# Patient Record
Sex: Female | Born: 1940
Health system: Southern US, Community
[De-identification: ages and names within clinical notes are randomized; demographics above are authoritative.]

## PROBLEM LIST (undated history)

## (undated) DIAGNOSIS — K5909 Other constipation: Secondary | ICD-10-CM

## (undated) DIAGNOSIS — Z9889 Other specified postprocedural states: Secondary | ICD-10-CM

## (undated) DIAGNOSIS — R112 Nausea with vomiting, unspecified: Secondary | ICD-10-CM

## (undated) DIAGNOSIS — I10 Essential (primary) hypertension: Secondary | ICD-10-CM

## (undated) DIAGNOSIS — M8580 Other specified disorders of bone density and structure, unspecified site: Secondary | ICD-10-CM

## (undated) DIAGNOSIS — E785 Hyperlipidemia, unspecified: Secondary | ICD-10-CM

## (undated) DIAGNOSIS — Z905 Acquired absence of kidney: Secondary | ICD-10-CM

## (undated) DIAGNOSIS — K259 Gastric ulcer, unspecified as acute or chronic, without hemorrhage or perforation: Secondary | ICD-10-CM

## (undated) DIAGNOSIS — M549 Dorsalgia, unspecified: Secondary | ICD-10-CM

## (undated) HISTORY — PX: UPPER GASTROINTESTINAL ENDOSCOPY: SHX188

## (undated) HISTORY — PX: NEPHRECTOMY LIVING DONOR: SUR877

## (undated) HISTORY — DX: Acquired absence of kidney: Z90.5

## (undated) HISTORY — PX: CLEFT PALATE REPAIR: SUR1165

## (undated) HISTORY — PX: TUBAL LIGATION: SHX77

## (undated) HISTORY — DX: Gastric ulcer, unspecified as acute or chronic, without hemorrhage or perforation: K25.9

## (undated) HISTORY — DX: Hyperlipidemia, unspecified: E78.5

## (undated) HISTORY — DX: Dorsalgia, unspecified: M54.9

## (undated) HISTORY — PX: COLONOSCOPY: SHX174

## (undated) HISTORY — DX: Essential (primary) hypertension: I10

## (undated) HISTORY — DX: Other constipation: K59.09

## (undated) HISTORY — DX: Other specified disorders of bone density and structure, unspecified site: M85.80

---

## 1998-03-02 ENCOUNTER — Emergency Department (HOSPITAL_COMMUNITY): Admission: EM | Admit: 1998-03-02 | Discharge: 1998-03-02 | Payer: Self-pay | Admitting: Emergency Medicine

## 2000-08-17 ENCOUNTER — Other Ambulatory Visit: Admission: RE | Admit: 2000-08-17 | Discharge: 2000-08-17 | Payer: Self-pay | Admitting: Family Medicine

## 2002-03-31 ENCOUNTER — Other Ambulatory Visit: Admission: RE | Admit: 2002-03-31 | Discharge: 2002-03-31 | Payer: Self-pay | Admitting: Family Medicine

## 2002-04-04 ENCOUNTER — Encounter: Payer: Self-pay | Admitting: Family Medicine

## 2002-04-04 ENCOUNTER — Encounter: Admission: RE | Admit: 2002-04-04 | Discharge: 2002-04-04 | Payer: Self-pay | Admitting: Family Medicine

## 2003-11-27 ENCOUNTER — Other Ambulatory Visit: Admission: RE | Admit: 2003-11-27 | Discharge: 2003-11-27 | Payer: Self-pay | Admitting: Family Medicine

## 2003-12-11 ENCOUNTER — Encounter: Admission: RE | Admit: 2003-12-11 | Discharge: 2003-12-11 | Payer: Self-pay | Admitting: Family Medicine

## 2004-05-23 ENCOUNTER — Emergency Department (HOSPITAL_COMMUNITY): Admission: EM | Admit: 2004-05-23 | Discharge: 2004-05-23 | Payer: Self-pay | Admitting: Family Medicine

## 2004-10-28 ENCOUNTER — Ambulatory Visit: Payer: Self-pay | Admitting: Family Medicine

## 2004-10-28 LAB — CONVERTED CEMR LAB: Hgb A1c MFr Bld: 5.2 %

## 2004-11-09 HISTORY — PX: FOOT NEUROMA SURGERY: SHX646

## 2005-01-06 ENCOUNTER — Other Ambulatory Visit: Admission: RE | Admit: 2005-01-06 | Discharge: 2005-01-06 | Payer: Self-pay | Admitting: Family Medicine

## 2005-01-06 ENCOUNTER — Ambulatory Visit: Payer: Self-pay | Admitting: Family Medicine

## 2005-01-06 LAB — CONVERTED CEMR LAB: Pap Smear: NORMAL

## 2005-01-07 LAB — HM DEXA SCAN

## 2005-01-20 ENCOUNTER — Encounter: Admission: RE | Admit: 2005-01-20 | Discharge: 2005-01-20 | Payer: Self-pay | Admitting: Family Medicine

## 2005-04-28 ENCOUNTER — Ambulatory Visit: Payer: Self-pay | Admitting: Family Medicine

## 2005-09-29 ENCOUNTER — Ambulatory Visit: Payer: Self-pay | Admitting: Family Medicine

## 2006-07-20 ENCOUNTER — Ambulatory Visit: Payer: Self-pay | Admitting: Family Medicine

## 2006-08-03 ENCOUNTER — Encounter: Admission: RE | Admit: 2006-08-03 | Discharge: 2006-08-03 | Payer: Self-pay | Admitting: Family Medicine

## 2006-11-02 ENCOUNTER — Ambulatory Visit: Payer: Self-pay | Admitting: Family Medicine

## 2006-11-02 LAB — CONVERTED CEMR LAB
ALT: 17 units/L (ref 0–40)
AST: 24 units/L (ref 0–37)
Basophils Absolute: 0 10*3/uL (ref 0.0–0.1)
Basophils Relative: 0.4 % (ref 0.0–1.0)
Cholesterol: 154 mg/dL (ref 0–200)
Eosinophils Absolute: 0.2 10*3/uL (ref 0.0–0.6)
Eosinophils Relative: 2.5 % (ref 0.0–5.0)
HCT: 39 % (ref 36.0–46.0)
HDL: 37.6 mg/dL — ABNORMAL LOW (ref >39.0)
Hemoglobin: 13.4 g/dL (ref 12.0–15.0)
LDL Cholesterol: 90 mg/dL (ref 0–99)
Lymphocytes Relative: 24 % (ref 12.0–46.0)
MCHC: 34.5 g/dL (ref 30.0–36.0)
MCV: 91.9 fL (ref 78.0–100.0)
Monocytes Absolute: 0.4 10*3/uL (ref 0.2–0.7)
Monocytes Relative: 5.6 % (ref 3.0–11.0)
Neutro Abs: 4.6 10*3/uL (ref 1.4–7.7)
Neutrophils Relative %: 67.5 % (ref 43.0–77.0)
Platelets: 220 10*3/uL (ref 150–400)
RBC: 4.24 M/uL (ref 3.87–5.11)
RDW: 12 % (ref 11.5–14.6)
TSH: 2.93 microintl units/mL (ref 0.35–5.50)
Total CHOL/HDL Ratio: 4.1
Triglycerides: 133 mg/dL (ref 0–149)
VLDL: 27 mg/dL (ref 0–40)
WBC: 6.9 10*3/uL (ref 4.5–10.5)

## 2006-12-07 ENCOUNTER — Ambulatory Visit: Payer: Self-pay | Admitting: Gastroenterology

## 2006-12-08 LAB — HM COLONOSCOPY

## 2006-12-17 ENCOUNTER — Ambulatory Visit: Payer: Self-pay | Admitting: Gastroenterology

## 2007-12-20 ENCOUNTER — Ambulatory Visit: Payer: Self-pay | Admitting: Family Medicine

## 2007-12-20 DIAGNOSIS — E785 Hyperlipidemia, unspecified: Secondary | ICD-10-CM | POA: Insufficient documentation

## 2007-12-20 DIAGNOSIS — E7800 Pure hypercholesterolemia, unspecified: Secondary | ICD-10-CM | POA: Insufficient documentation

## 2007-12-20 DIAGNOSIS — R7303 Prediabetes: Secondary | ICD-10-CM | POA: Insufficient documentation

## 2007-12-20 DIAGNOSIS — M899 Disorder of bone, unspecified: Secondary | ICD-10-CM | POA: Insufficient documentation

## 2007-12-20 DIAGNOSIS — M949 Disorder of cartilage, unspecified: Secondary | ICD-10-CM

## 2007-12-20 DIAGNOSIS — K5909 Other constipation: Secondary | ICD-10-CM | POA: Insufficient documentation

## 2007-12-23 LAB — CONVERTED CEMR LAB
ALT: 19 units/L (ref 0–35)
AST: 21 units/L (ref 0–37)
Albumin: 4 g/dL (ref 3.5–5.2)
Alkaline Phosphatase: 79 units/L (ref 39–117)
BUN: 13 mg/dL (ref 6–23)
Bilirubin, Direct: 0.1 mg/dL (ref 0.0–0.3)
CO2: 29 meq/L (ref 19–32)
Calcium: 9.4 mg/dL (ref 8.4–10.5)
Chloride: 109 meq/L (ref 96–112)
Cholesterol: 167 mg/dL (ref 0–200)
Creatinine, Ser: 1 mg/dL (ref 0.4–1.2)
GFR calc Af Amer: 72 mL/min
GFR calc non Af Amer: 59 mL/min
Glucose, Bld: 102 mg/dL — ABNORMAL HIGH (ref 70–99)
HDL: 35 mg/dL — ABNORMAL LOW (ref >39.0)
Hgb A1c MFr Bld: 5.7 % (ref 4.6–6.0)
LDL Cholesterol: 111 mg/dL — ABNORMAL HIGH (ref 0–99)
Phosphorus: 3.6 mg/dL (ref 2.3–4.6)
Potassium: 4.7 meq/L (ref 3.5–5.1)
Sodium: 143 meq/L (ref 135–145)
TSH: 2.46 microintl units/mL (ref 0.35–5.50)
Total Bilirubin: 0.6 mg/dL (ref 0.3–1.2)
Total CHOL/HDL Ratio: 4.8
Total Protein: 7 g/dL (ref 6.0–8.3)
Triglycerides: 107 mg/dL (ref 0–149)
VLDL: 21 mg/dL (ref 0–40)

## 2007-12-25 LAB — CONVERTED CEMR LAB: Vit D, 1,25-Dihydroxy: 40 (ref 30–89)

## 2008-03-13 ENCOUNTER — Ambulatory Visit: Payer: Self-pay | Admitting: Family Medicine

## 2008-03-13 ENCOUNTER — Other Ambulatory Visit: Admission: RE | Admit: 2008-03-13 | Discharge: 2008-03-13 | Payer: Self-pay | Admitting: Family Medicine

## 2008-03-13 ENCOUNTER — Encounter: Payer: Self-pay | Admitting: Family Medicine

## 2008-03-13 DIAGNOSIS — M545 Low back pain, unspecified: Secondary | ICD-10-CM | POA: Insufficient documentation

## 2008-03-13 DIAGNOSIS — M79605 Pain in left leg: Secondary | ICD-10-CM | POA: Insufficient documentation

## 2008-03-18 ENCOUNTER — Encounter (INDEPENDENT_AMBULATORY_CARE_PROVIDER_SITE_OTHER): Payer: Self-pay | Admitting: *Deleted

## 2008-03-18 LAB — CONVERTED CEMR LAB: Pap Smear: NORMAL

## 2008-03-19 ENCOUNTER — Encounter: Payer: Self-pay | Admitting: Family Medicine

## 2008-04-08 ENCOUNTER — Encounter: Admission: RE | Admit: 2008-04-08 | Discharge: 2008-04-08 | Payer: Self-pay | Admitting: Family Medicine

## 2008-04-08 ENCOUNTER — Encounter: Payer: Self-pay | Admitting: Family Medicine

## 2008-04-15 ENCOUNTER — Encounter (INDEPENDENT_AMBULATORY_CARE_PROVIDER_SITE_OTHER): Payer: Self-pay | Admitting: *Deleted

## 2008-05-11 ENCOUNTER — Ambulatory Visit: Payer: Self-pay | Admitting: Family Medicine

## 2008-05-18 LAB — CONVERTED CEMR LAB
OCCULT 1: NEGATIVE
OCCULT 2: NEGATIVE
OCCULT 3: NEGATIVE

## 2009-05-14 ENCOUNTER — Ambulatory Visit: Payer: Self-pay | Admitting: Family Medicine

## 2009-05-14 ENCOUNTER — Encounter: Admission: RE | Admit: 2009-05-14 | Discharge: 2009-05-14 | Payer: Self-pay | Admitting: Family Medicine

## 2009-05-17 LAB — CONVERTED CEMR LAB
ALT: 18 units/L (ref 0–35)
AST: 26 units/L (ref 0–37)
Albumin: 4.1 g/dL (ref 3.5–5.2)
BUN: 14 mg/dL (ref 6–23)
CO2: 32 meq/L (ref 19–32)
Calcium: 9.5 mg/dL (ref 8.4–10.5)
Chloride: 108 meq/L (ref 96–112)
Cholesterol: 155 mg/dL (ref 0–200)
Creatinine, Ser: 1.1 mg/dL (ref 0.4–1.2)
Glucose, Bld: 104 mg/dL — ABNORMAL HIGH (ref 70–99)
HDL: 38.1 mg/dL — ABNORMAL LOW (ref >39.00)
Hgb A1c MFr Bld: 5.8 % (ref 4.6–6.5)
LDL Cholesterol: 85 mg/dL (ref 0–99)
Phosphorus: 4 mg/dL (ref 2.3–4.6)
Potassium: 5 meq/L (ref 3.5–5.1)
Sodium: 145 meq/L (ref 135–145)
Total CHOL/HDL Ratio: 4
Triglycerides: 158 mg/dL — ABNORMAL HIGH (ref 0.0–149.0)
VLDL: 31.6 mg/dL (ref 0.0–40.0)

## 2009-05-19 ENCOUNTER — Encounter (INDEPENDENT_AMBULATORY_CARE_PROVIDER_SITE_OTHER): Payer: Self-pay | Admitting: *Deleted

## 2009-05-19 LAB — CONVERTED CEMR LAB: Vit D, 25-Hydroxy: 36 ng/mL (ref 30–89)

## 2009-10-06 ENCOUNTER — Telehealth: Payer: Self-pay | Admitting: Family Medicine

## 2010-04-05 ENCOUNTER — Ambulatory Visit: Payer: Self-pay | Admitting: Internal Medicine

## 2010-04-05 DIAGNOSIS — B029 Zoster without complications: Secondary | ICD-10-CM | POA: Insufficient documentation

## 2010-04-12 ENCOUNTER — Telehealth: Payer: Self-pay | Admitting: Internal Medicine

## 2010-04-25 ENCOUNTER — Telehealth: Payer: Self-pay | Admitting: Internal Medicine

## 2010-05-12 ENCOUNTER — Ambulatory Visit: Payer: Self-pay | Admitting: Family Medicine

## 2010-05-13 LAB — CONVERTED CEMR LAB
ALT: 14 units/L (ref 0–35)
AST: 18 units/L (ref 0–37)
Albumin: 4.1 g/dL (ref 3.5–5.2)
BUN: 22 mg/dL (ref 6–23)
CO2: 28 meq/L (ref 19–32)
Calcium: 9.9 mg/dL (ref 8.4–10.5)
Chloride: 103 meq/L (ref 96–112)
Cholesterol: 183 mg/dL (ref 0–200)
Creatinine, Ser: 0.9 mg/dL (ref 0.4–1.2)
GFR calc non Af Amer: 63.77 mL/min (ref >60)
Glucose, Bld: 96 mg/dL (ref 70–99)
HDL: 46.9 mg/dL (ref >39.00)
LDL Cholesterol: 109 mg/dL — ABNORMAL HIGH (ref 0–99)
Phosphorus: 3.6 mg/dL (ref 2.3–4.6)
Potassium: 5.3 meq/L — ABNORMAL HIGH (ref 3.5–5.1)
Sodium: 140 meq/L (ref 135–145)
Total CHOL/HDL Ratio: 4
Triglycerides: 137 mg/dL (ref 0.0–149.0)
VLDL: 27.4 mg/dL (ref 0.0–40.0)

## 2010-06-17 ENCOUNTER — Ambulatory Visit: Payer: Self-pay | Admitting: Family Medicine

## 2010-06-17 DIAGNOSIS — E875 Hyperkalemia: Secondary | ICD-10-CM | POA: Insufficient documentation

## 2010-06-20 LAB — CONVERTED CEMR LAB: Potassium: 4.9 meq/L (ref 3.5–5.1)

## 2010-07-01 ENCOUNTER — Encounter: Admission: RE | Admit: 2010-07-01 | Discharge: 2010-07-01 | Payer: Self-pay | Admitting: Family Medicine

## 2010-07-01 LAB — HM MAMMOGRAPHY: HM Mammogram: NORMAL

## 2010-07-05 ENCOUNTER — Encounter: Payer: Self-pay | Admitting: Family Medicine

## 2010-11-10 NOTE — Assessment & Plan Note (Signed)
Summary: cpx/dlo   Vital Signs:  Patient Profile:   70 Years Old Female Height:     63.50 inches Weight:      149 pounds Temp:     98.6 degrees F oral Pulse rate:   76 / minute Pulse rhythm:   regular BP sitting:   130 / 80 Cuff size:   regular  Vitals Entered By: Providence Crosby (March 13, 2008 10:29 AM)                 Chief Complaint:  CHECK UP// HEMOCCULT CARDS TO PATIENT.  History of Present Illness: has been feeling ok overall a lot of family events coming up  went to a health fair in [place]- last week end  did free chiropractic eval - did have inc weight on R  also looked at neck- ? abn in her neck was told her posture was bad   low back continues to hurt   labs in march LDL 111-- HDL 35  AIC 5.7   diet is pretty good - not perfect does exercise at [employer]-- at least 3 days per wk, also wts at home wants to start walking (walks a lot at work), also works on the farm  has not ridden her horse in a while   takes mvi daily vit D 1000 international units daily ca - not taking  is not on any OP med- decided to stop her actonel due to convenience  needs dexa and mam  colonosc last year tics-- still has tremendous constipation- take correctol regularly stays bloated all the time  amitiza did not help          Prior Medications Reviewed Using: Patient Recall  Current Allergies (reviewed today): ! CODEINE  Past Medical History:    Osteopenia    back pain     hyperlipidemia    hyperglycemia    chronic constipation  Past Surgical History:    Reviewed history from 12/20/2007 and no changes required:       Cleft palate       Tubal ligation       Nephrectomy-left, donar (02/1997)       Carotid dopplers- neg       Stress EKG- pos, ST changes, T wave inversion (11/1998)       Dexa- osteopenia LS (05/1999),  worse (04/2002),  stable (01/2005)       Colonoscopy- neg (1998,  04/2002)       Morton's neuroma surgery (11/2004)       Stress test- normal per  pt (2007)       Colonoscopy- neg (12/2006)   Family History:    mother- died of cancer (re occurance of ? colon cancer), DM, CAD. OP, dementia-- lived into her 40s     F CAD in 54s, and CHF  Social History:    Marital Status: Married    Children:     Occupation: - works at a church     smoked for 1 year after HS-- never since    no alcohol    Risk Factors:  Colonoscopy History:     Date of Last Colonoscopy:  12/08/2006    Results:  Diverticulosis     Physical Exam  General:     Well-developed,well-nourished,in no acute distress; alert,appropriate and cooperative throughout examination Head:     normocephalic, atraumatic, and no abnormalities observed.  cleft palate repair changes  Eyes:     vision grossly intact, pupils equal, pupils round, and  pupils reactive to light.  no conjunctival pallor, injection or icterus  Ears:     R ear normal and L ear normal.   Nose:     no nasal discharge.   Mouth:     pharynx pink and moist.   Neck:     supple with full rom and no masses or thyromegally, no JVD or carotid bruit  Breasts:     No mass, nodules, thickening, tenderness, bulging, retraction, inflamation, nipple discharge or skin changes noted.   Lungs:     Normal respiratory effort, chest expands symmetrically. Lungs are clear to auscultation, no crackles or wheezes. Heart:     Normal rate and regular rhythm. S1 and S2 normal without gallop, murmur, click, rub or other extra sounds. Abdomen:     Bowel sounds positive,abdomen soft and non-tender without masses, organomegaly or hernias noted.  no renal bruits  Rectal:     No external abnormalities noted. Normal sphincter tone. No rectal masses or tenderness.--heme neg stool  Genitalia:     Normal introitus for age, no external lesions, no vaginal discharge, mucosa pink and moist, no vaginal or cervical lesions, no vaginal atrophy, no friaility or hemorrhage, normal uterus size and position, no adnexal masses or  tenderness Msk:     No deformity or scoliosis noted of thoracic or lumbar spine.   Pulses:     R and L carotid,radial,femoral,dorsalis pedis and posterior tibial pulses are full and equal bilaterally Extremities:     No clubbing, cyanosis, edema, or deformity noted with normal full range of motion of all joints.   Neurologic:     sensation intact to light touch, gait normal, and DTRs symmetrical and normal.   Skin:     Intact without suspicious lesions or rashes few solar lentigos Cervical Nodes:     No lymphadenopathy noted Axillary Nodes:     No palpable lymphadenopathy Inguinal Nodes:     No significant adenopathy Psych:     normal affect, talkative and pleasant     Impression & Recommendations:  Problem # 1:  CONSTIPATION, CHRONIC (ICD-564.09) Assessment: Unchanged ref to GI to disc tx and ? if any studies she could participate in  on stool softener daily- and good diet/fluid intake  Orders: Gastroenterology Referral (GI)   Problem # 2:  BACK PAIN (ICD-724.5) Assessment: Deteriorated ongoing- with ? re: recent chiropractic screen ref to ortho Orders: Orthopedic Referral (Ortho)   Problem # 3:  OTHER SCREENING MAMMOGRAM (ICD-V76.12) Assessment: Comment Only enc self exams sched mam Orders: Radiology Referral (Radiology)   Problem # 4:  HYPERLIPIDEMIA (ICD-272.4) Assessment: Unchanged fair control with diet and statin keep working on inc HDL with exercise and omega 3 Her updated medication list for this problem includes:    Lipitor 10 Mg Tabs (Atorvastatin calcium) .Marland Kitchen... 1 by mouth once daily   Problem # 5:  HYPERGLYCEMIA (ICD-790.29) Assessment: Unchanged AIC is under 6-- urged to keep up good work with diet   Problem # 6:  ROUTINE GYNECOLOGICAL EXAMINATION (ICD-V72.31) Assessment: Comment Only exam and pap done today- pend results   Complete Medication List: 1)  Lipitor 10 Mg Tabs (Atorvastatin calcium) .Marland Kitchen.. 1 by mouth once daily 2)  D 1000 Plus  Tabs (Fa-cyanocobalamin-b6-d-ca) .Marland Kitchen.. 1 daily by mouth 3)  Flax Seed Oil 1000 Mg Caps (Flaxseed (linseed)) .Marland Kitchen.. 1 daily by mouth 4)  Centrum Silver Tabs (Multiple vitamins-minerals) .Marland Kitchen.. 1 daily by mouth 5)  Correctal Tablet  .Marland Kitchen.. 1 daily by mouth  Other Orders:  Radiology Referral (Radiology)   Patient Instructions: 1)  the current recommendation for calcium intake is 1200-1500 mg daily with [phone] IU of vitamin D  2)  keep up good exercise  3)  we will set up mammogram and dexa at check out  4)  will refer you to orthopedics for back pain 5)  will refer you to GI for constipation   ]  Preventive Care Screening  Colonoscopy:    Date:  12/08/2006    Results:  Diverticulosis      not interested in flu vaccine, pnuemovax , and shingle vaccine

## 2010-11-10 NOTE — Letter (Signed)
Summary: Results Follow up Letter  [place] [facility]  [address] [place], [place] [address]   Phone: [phone]  Fax: [phone]    07/05/2010 MRN: [id number]    [name] [address] [place], [place]  [address]    Dear Ms. [name],  The following are the results of your recent test(s):  Test         Result    Pap Smear:        Normal _____  Not Normal _____ Comments: ______________________________________________________ Cholesterol: LDL(Bad cholesterol):         Your goal is less than:         HDL (Good cholesterol):       Your goal is more than: Comments:  ______________________________________________________ Mammogram:        Normal __X___  Not Normal _____ Comments:Repeat in one year.   ___________________________________________________________________ Hemoccult:        Normal _____  Not normal _______ Comments:    _____________________________________________________________________ Other Tests:    We routinely do not discuss normal results over the telephone.  If you desire a copy of the results, or you have any questions about this information we can discuss them at your next office visit.   Sincerely,    Idamae Schuller Tower,MD  MT/ri

## 2010-11-10 NOTE — Progress Notes (Signed)
Summary: regarding lipitor  Phone Note From Pharmacy   Caller: Gweneth Dimitri Summary of Call: Pharmacy faxed request to change pt from lipitor to simvastatin.  I spoke with the patient and she said she is fine with lipitor for now. Just an FYI. Initial call taken by: Lowella Petties CMA,  October 06, 2009 2:39 PM

## 2010-11-10 NOTE — Assessment & Plan Note (Signed)
Summary: PER LOU OK TO SCHED-NERVE PAIN DOWN R ARM--BACK RASH AND R AR...   Vital Signs:  Patient profile:   70 year old female Height:      63.50 inches (161.29 cm) Weight:      143.0 pounds (65 kg) BMI:     25.02 O2 Sat:      98 % on Room air Temp:     98.9 degrees F (37.17 degrees C) oral Pulse rate:   74 / minute BP sitting:   142 / 90  (left arm) Cuff size:   regular  Vitals Entered By: Orlan Leavens (April 05, 2010 11:31 AM)  O2 Flow:  Room air CC: Rash & Nerve pain Is Patient Diabetic? No Pain Assessment Patient in pain? yes     Location: (R) arn & shoulder Type: sharp/stinging Comments Pt states she been have pain on (R) shoulder x's 1 week. pain in radiating down (R) arm. As of yesterday she has develop a rash on her back arm. ? shingles   Primary Care Provider:  Judith Part MD  CC:  Rash & Nerve pain.  History of Present Illness:  Rash      This is a 70 year old woman who presents with Rash.  The symptoms began 3 days ago.  The severity is described as severe.  began with burning, intermittent pain along right posterior shoulder radiating down right arm 1 week ago. pain not exac bu movement or acticity, not improved with muscle relaxant or narcotic from ortho prev rx for shoulder problems.  The patient reports blisters and redness, but denies hives, pustules, and scaling.  The rash is located on the right arm.  The rash is worse with heat and better with cold.  Associated symptoms include burning.  The patient denies the following symptoms: fever, headache, difficulty breathing, abdominal pain, nausea, vomiting, diarrhea, dizziness, sore throat, eye symptoms, and arthralgias.  The patient denies history of recent tick bite, other insect bite, recent infection, recent antibiotic use, new medication, new clothing, new topical exposure, recent travel, pet/animal contact, and autoimmune disease.    Clinical Review Panels:  CBC   WBC:  6.9 (11/02/2006)   RBC:  4.24  (11/02/2006)   Hgb:  13.4 (11/02/2006)   Hct:  39.0 (11/02/2006)   Platelets:  220 (11/02/2006)   MCV  91.9 (11/02/2006)   MCHC  34.5 (11/02/2006)   RDW  12.0 (11/02/2006)   PMN:  67.5 (11/02/2006)   Lymphs:  24.0 (11/02/2006)   Monos:  5.6 (11/02/2006)   Eosinophils:  2.5 (11/02/2006)   Basophil:  0.4 (11/02/2006)  Complete Metabolic Panel   Glucose:  104 (05/14/2009)   Sodium:  145 (05/14/2009)   Potassium:  5.0 (05/14/2009)   Chloride:  108 (05/14/2009)   CO2:  32 (05/14/2009)   BUN:  14 (05/14/2009)   Creatinine:  1.1 (05/14/2009)   Albumin:  4.1 (05/14/2009)   Total Protein:  7.0 (12/20/2007)   Calcium:  9.5 (05/14/2009)   Total Bili:  0.6 (12/20/2007)   Alk Phos:  79 (12/20/2007)   SGPT (ALT):  18 (05/14/2009)   SGOT (AST):  26 (05/14/2009)   Current Medications (verified): 1)  Lipitor 10 Mg  Tabs (Atorvastatin Calcium) .Marland Kitchen.. 1 By Mouth Once Daily 2)  D 1000 Plus   Tabs (Fa-Cyanocobalamin-B6-D-Ca) .Marland Kitchen.. 1 Daily By Mouth 3)  Centrum Silver   Tabs (Multiple Vitamins-Minerals) .Marland Kitchen.. 1 Daily By Mouth 4)  Correctal Tablet .Marland KitchenMarland Kitchen. 1 Daily By Mouth As Needed 5)  Vitamin D 2000 Unit Tabs (Cholecalciferol) .... .take 1 Tablet By Mouth Once A Day  Allergies (verified): 1)  ! Codeine  Past History:  Past Medical History: Osteopenia back pain  hyperlipidemia hyperglycemia chronic constipation  MD roster: ortho - gioffre  Review of Systems       The patient complains of suspicious skin lesions.  The patient denies chest pain, syncope, headaches, and muscle weakness.         also denies neck pain  Physical Exam  General:  alert, well-developed, well-nourished, and cooperative to examination.    Eyes:  vision grossly intact; pupils equal, round and reactive to light.  conjunctiva and lids normal.    Lungs:  normal respiratory effort, no intercostal retractions or use of accessory muscles; normal breath sounds bilaterally - no crackles and no wheezes.    Heart:  normal  rate, regular rhythm, no murmur, and no rub. BLE without edema. normal DP pulses and normal cap refill in all 4 extremities    Msk:  right shoulder: Full range of motion. full strength with testing rotator cuff. Negative impingement signs. Nontender to palpation. Neurovascularly intact  Skin:  classic clusters of tiny vesicles on rosy base in multiple areas along right C6 dermatome c/w shingles   Impression & Recommendations:  Problem # 1:  SHINGLES (ICD-053.9) severe pain symptoms 7 days -  now with classic rash in last 72 h - start antiviral valtrex and lyrica - educated on dx and tx and poss post herpetic neuralgia pain as well as need to avoid contact with immune compromised population tordol given for acute pain symptoms  Orders: Ketorolac-Toradol 15mg  (J4782) Admin of Therapeutic Inj  intramuscular or subcutaneous (95621)  Complete Medication List: 1)  Lipitor 10 Mg Tabs (Atorvastatin calcium) .Marland Kitchen.. 1 by mouth once daily 2)  D 1000 Plus Tabs (Fa-cyanocobalamin-b6-d-ca) .Marland Kitchen.. 1 daily by mouth 3)  Centrum Silver Tabs (Multiple vitamins-minerals) .Marland Kitchen.. 1 daily by mouth 4)  Correctal Tablet  .Marland Kitchen.. 1 daily by mouth as needed 5)  Vitamin D 2000 Unit Tabs (Cholecalciferol) .... .take 1 tablet by mouth once a day 6)  Valtrex 1 Gm Tabs (Valacyclovir hcl) .Marland Kitchen.. 1 by mouth three times a day x 7 days 7)  Lyrica 75 Mg Caps (Pregabalin) .Marland Kitchen.. 1 by mouth two times a day  Patient Instructions: 1)  it was good to see you today. 2)  for your shingles, Valtrex for the virus and Lyrica for the nerves pain - your prescriptions have been  given to you to submit to your pharmacy. Please take as directed. Contact our office if you believe you're having problems with the medication(s).  3)  tordol shot for pain given today 4)  avoid contact with people who are immune compromised and keep rash covered if you are in public 5)  Please schedule a follow-up appointment as needed. Prescriptions: LYRICA 75 MG CAPS  (PREGABALIN) 1 by mouth two times a day  #60 x 1   Entered and Authorized by:   Newt Lukes MD   Signed by:   Newt Lukes MD on 04/05/2010   Method used:   Print then Give to Patient   RxID:   [id number] VALTREX 1 GM TABS (VALACYCLOVIR HCL) 1 by mouth three times a day x 7 days  #21 x 0   Entered and Authorized by:   Newt Lukes MD   Signed by:   Newt Lukes MD on 04/05/2010   Method used:  Print then Give to Patient   RxID:   [id number]    Medication Administration  Injection # 1:    Medication: Ketorolac-Toradol 15mg     Diagnosis: SHINGLES (ICD-053.9)    Route: IM    Site: RUOQ gluteus    Exp Date: 11/09/2010    Lot #: [id number]    Mfr: novaplus    Patient tolerated injection without complications    Given by: Orlan Leavens (April 05, 2010 12:01 PM)  Orders Added: 1)  Ketorolac-Toradol 15mg  [J1885] 2)  Admin of Therapeutic Inj  intramuscular or subcutaneous [96372] 3)  Est. Patient Level IV [65784]

## 2010-11-10 NOTE — Assessment & Plan Note (Signed)
Summary: REFILL MEDICATION/CLE   Vital Signs:  Patient profile:   70 year old female Height:      63.50 inches Weight:      144 pounds BMI:     25.20 Temp:     98.2 degrees F oral Pulse rate:   88 / minute Pulse rhythm:   regular BP sitting:   130 / 80  (left arm) Cuff size:   regular  Vitals Entered By: Liane Comber CMA (May 14, 2009 8:27 AM)  History of Present Illness: here for f/u mult med issues feeling good   bp is good 130/80  lipids have been well controlled on lipitor- due a check  no probs with med-- but if it becomes more expensive will need to change to generic  osteopenia stable - dexa in 09 ca and vit D-- for her bones , is on 2000 international units daily   colonosc utd 08  wt is down 4 lb  is trying to loose wt- eating healthy (elim sweets)  some exercise too - goes to [employer] , and some walking   mam 7/09--has it scheduled at 11:30 today   have been watching for hyperglycemia -- check AIC today diet is great with some wt losss     Allergies: 1)  ! Codeine  Past History:  Past Medical History: Last updated: March 21, 2008 Osteopenia back pain  hyperlipidemia hyperglycemia chronic constipation  Past Surgical History: Last updated: 12/20/2007 Cleft palate Tubal ligation Nephrectomy-left, donar (02/1997) Carotid dopplers- neg Stress EKG- pos, ST changes, T wave inversion (11/1998) Dexa- osteopenia LS (05/1999),  worse (04/2002),  stable (01/2005) Colonoscopy- neg (1998,  04/2002) Morton's neuroma surgery (11/2004) Stress test- normal per pt (2007) Colonoscopy- neg (12/2006)  Family History: Last updated: 03-21-2008 mother- died of cancer (re occurance of ? colon cancer), DM, CAD. OP, dementia-- lived into her 19s  F CAD in 37s, and CHF  Social History: Last updated: 03-21-2008 Marital Status: Married Children:  Occupation: - works at [employer]  smoked for 1 year after HS-- never since no alcohol   Risk Factors: Smoking  Status: never (12/20/2007)  Review of Systems General:  Denies fatigue, fever, loss of appetite, and malaise. Eyes:  Denies blurring and eye pain. CV:  Denies chest pain or discomfort, palpitations, and shortness of breath with exertion. Resp:  Denies cough and wheezing. GI:  Denies abdominal pain, change in bowel habits, and indigestion. MS:  Denies joint pain and muscle aches. Derm:  Denies itching, lesion(s), and rash. Neuro:  Denies numbness and tingling. Psych:  mood is good . Endo:  Denies excessive thirst and excessive urination. Heme:  Denies abnormal bruising.  Physical Exam  General:  Well-developed,well-nourished,in no acute distress; alert,appropriate and cooperative throughout examination Head:  normocephalic, atraumatic, and no abnormalities observed.   Eyes:  vision grossly intact, pupils equal, pupils round, and pupils reactive to light.  no conjunctival pallor, injection or icterus  Mouth:  pharynx pink and moist.   Neck:  supple with full rom and no masses or thyromegally, no JVD or carotid bruit  Chest Wall:  No deformities, masses, or tenderness noted. Lungs:  Normal respiratory effort, chest expands symmetrically. Lungs are clear to auscultation, no crackles or wheezes. Heart:  Normal rate and regular rhythm. S1 and S2 normal without gallop, murmur, click, rub or other extra sounds. Abdomen:  soft and non-tender.   Msk:  No deformity or scoliosis noted of thoracic or lumbar spine.  no acute joint changes  Pulses:  R and L carotid,radial,femoral,dorsalis pedis and posterior tibial pulses are full and equal bilaterally Extremities:  No clubbing, cyanosis, edema, or deformity noted with normal full range of motion of all joints.   Neurologic:  sensation intact to light touch, gait normal, and DTRs symmetrical and normal.   Skin:  Intact without suspicious lesions or rashes Cervical Nodes:  No lymphadenopathy noted Psych:  normal affect, talkative and pleasant     Impression & Recommendations:  Problem # 1:  HYPERLIPIDEMIA (ICD-272.4) Assessment Unchanged  with good diet and prior good control with lipitor lab today and update rev low sat fat diet  Her updated medication list for this problem includes:    Lipitor 10 Mg Tabs (Atorvastatin calcium) .Marland Kitchen... 1 by mouth once daily  Labs Reviewed: SGOT: 21 (12/20/2007)   SGPT: 19 (12/20/2007)   HDL:35.0 (12/20/2007), 37.6 (11/02/2006)  LDL:111 (12/20/2007), 90 (16/07/9603)  Chol:167 (12/20/2007), 154 (11/02/2006)  Trig:107 (12/20/2007), 133 (11/02/2006)  Problem # 2:  HYPERGLYCEMIA (ICD-790.29) Assessment: Unchanged  this has been controlled with diet and exercise  urged to keep it up  AICtoday and renal prof  Orders: Venipuncture (54098) TLB-A1C / Hgb A1C (Glycohemoglobin) (83036-A1C)  Labs Reviewed: Creat: 1.0 (12/20/2007)     Problem # 3:  OTHER SCREENING MAMMOGRAM (ICD-V76.12) Assessment: Comment Only for mam today  Problem # 4:  OSTEOPENIA (ICD-733.90) Assessment: Unchanged with stable dexa in 09 adv to continue ca and D and exercise vit D level today Orders: Venipuncture (11914) T-Vitamin D (25-Hydroxy) (78295-62130)  Complete Medication List: 1)  Lipitor 10 Mg Tabs (Atorvastatin calcium) .Marland Kitchen.. 1 by mouth once daily 2)  D 1000 Plus Tabs (Fa-cyanocobalamin-b6-d-ca) .Marland Kitchen.. 1 daily by mouth 3)  Centrum Silver Tabs (Multiple vitamins-minerals) .Marland Kitchen.. 1 daily by mouth 4)  Correctal Tablet  .Marland Kitchen.. 1 daily by mouth 5)  Vitamin D 200 Mg  .... Take 1 tablet by mouth once a day  Other Orders: TLB-Lipid Panel (80061-LIPID) TLB-Renal Function Panel (80069-RENAL) TLB-ALT (SGPT) (84460-ALT) TLB-AST (SGOT) (84450-SGOT)  Patient Instructions: 1)  keep up the good work with diet and exercise 2)  no change in medicine  3)  labs today Prescriptions: LIPITOR 10 MG  TABS (ATORVASTATIN CALCIUM) 1 by mouth once daily  #90 x 3   Entered and Authorized by:   Judith Part MD   Signed by:    Judith Part MD on 05/14/2009   Method used:   Print then Give to Patient   RxID:   [id number]   Prior Medications (reviewed today): D 1000 PLUS   TABS (FA-CYANOCOBALAMIN-B6-D-CA) 1 DAILY BY MOUTH CENTRUM SILVER   TABS (MULTIPLE VITAMINS-MINERALS) 1 DAILY BY MOUTH CORRECTAL TABLET () 1 DAILY BY MOUTH VITAMIN D 200 MG () Take 1 tablet by mouth once a day Current Allergies (reviewed today): ! CODEINE Current Medications (including changes made in today's visit):  LIPITOR 10 MG  TABS (ATORVASTATIN CALCIUM) 1 by mouth once daily D 1000 PLUS   TABS (FA-CYANOCOBALAMIN-B6-D-CA) 1 DAILY BY MOUTH CENTRUM SILVER   TABS (MULTIPLE VITAMINS-MINERALS) 1 DAILY BY MOUTH * CORRECTAL TABLET 1 DAILY BY MOUTH * VITAMIN D 200 MG Take 1 tablet by mouth once a day

## 2010-11-10 NOTE — Consult Note (Signed)
Summary: [facility] / DR. Tinnie Gens BEANE  [facility] / DR. Jene Every   Imported By: Carin Primrose 03/25/2008 07:55:06  _____________________________________________________________________  External Attachment:    Type:   Image     Comment:   External Document

## 2010-11-10 NOTE — Assessment & Plan Note (Signed)
Summary: FOLLOW UP APPT/RI   Vital Signs:  Patient profile:   70 year old female Height:      63.50 inches Weight:      143.25 pounds BMI:     25.07 Temp:     99 degrees F oral Pulse rate:   72 / minute Pulse rhythm:   regular BP sitting:   146 / 90  (left arm) Cuff size:   regular  Vitals Entered By: Lewanda Rife LPN 06/30/2010 12:07 PM) CC: follow up to discuss K   History of Present Illness: here for f/u of mildly high K   is feeling fine     wt is stable   bp 146/90- this is up a bit  lpids good with trig 137, HDl 46 and LDL 109- this is up a bit  eating more mayo    K was 5.3- ? reason  adv to stop K vits and also high K foods-- has been eating a tremendous amt of tomato  holding the centrum silver  has eaten a few bananas  renal fxn is nl  diet overall has not changed  [facility]    Allergies: 1)  ! Codeine  Past History:  Past Medical History: Last updated: 04/05/2010 Osteopenia back pain  hyperlipidemia hyperglycemia chronic constipation  MD roster: ortho - gioffre  Past Surgical History: Last updated: 12/20/2007 Cleft palate Tubal ligation Nephrectomy-left, donar (02/1997) Carotid dopplers- neg Stress EKG- pos, ST changes, T wave inversion (11/1998) Dexa- osteopenia LS (05/1999),  worse (04/2002),  stable (01/2005) Colonoscopy- neg (1998,  04/2002) Morton's neuroma surgery (11/2004) Stress test- normal per pt (2007) Colonoscopy- neg (12/2006)  Family History: Last updated: 30-Jun-2010 mother- died of cancer (re occurance of ? colon cancer), DM, CAD., HTN  OP, dementia-- lived into her 9s  F CAD in 76s, and CHF, HTN   Social History: Last updated: 03/13/2008 Marital Status: Married Children:  Occupation: - works at a church  smoked for 1 year after HS-- never since no alcohol   Risk Factors: Smoking Status: never (12/20/2007)  Family History: mother- died of cancer (re occurance of ? colon cancer),  DM, CAD., HTN  OP, dementia-- lived into her 61s  F CAD in 60s, and CHF, HTN   Review of Systems General:  Denies chills, fatigue, fever, loss of appetite, and malaise. Eyes:  Denies blurring and eye irritation. CV:  Denies chest pain or discomfort, lightheadness, and palpitations. Resp:  Denies cough, pleuritic, shortness of breath, and wheezing. GI:  Denies abdominal pain, change in bowel habits, indigestion, and nausea. GU:  Denies dysuria and urinary frequency. MS:  Complains of cramps; denies stiffness; occ leg cramps . Derm:  Denies itching, lesion(s), poor wound healing, and rash. Neuro:  Denies numbness and tingling. Endo:  Denies cold intolerance, excessive thirst, excessive urination, and heat intolerance. Heme:  Denies abnormal bruising and bleeding.  Physical Exam  General:  Well-developed,well-nourished,in no acute distress; alert,appropriate and cooperative throughout examination Head:  normocephalic, atraumatic, and no abnormalities observed.   Eyes:  vision grossly intact, pupils equal, pupils round, and pupils reactive to light.   Mouth:  pharynx pink and moist.   Neck:  supple with full rom and no masses or thyromegally, no JVD or carotid bruit  Chest Wall:  No deformities, masses, or tenderness noted. Lungs:  normal respiratory effort, no intercostal retractions or use of accessory muscles; normal breath sounds bilaterally - no crackles and no wheezes.  Heart:  normal rate, regular rhythm, no murmur, and no rub. BLE without edema. normal DP pulses and normal cap refill in all 4 extremities    Abdomen:  Bowel sounds positive,abdomen soft and non-tender without masses, organomegaly or hernias noted. no renal bruits  Msk:  No deformity or scoliosis noted of thoracic or lumbar spine.  no acute joint changes  Pulses:  R and L carotid,radial,femoral,dorsalis pedis and posterior tibial pulses are full and equal bilaterally Extremities:  No clubbing, cyanosis, edema, or  deformity noted with normal full range of motion of all joints.   Neurologic:  sensation intact to light touch, gait normal, and DTRs symmetrical and normal.   Skin:  Intact without suspicious lesions or rashes Cervical Nodes:  No lymphadenopathy noted Inguinal Nodes:  No significant adenopathy Psych:  normal affect, talkative and pleasant    Impression & Recommendations:  Problem # 1:  HYPERKALEMIA (ICD-276.7) Assessment New possibly from tomato intake  re check today adv to avoid supplements with K Orders: TLB-Potassium (K+) (84132-K) Fingerstick ([address])  Problem # 2:  HYPERLIPIDEMIA (ICD-272.4) Assessment: Unchanged well controlled on lipitor and diet rev labs with pt  rev low sat fat diet Her updated medication list for this problem includes:    Lipitor 10 Mg Tabs (Atorvastatin calcium) .Marland Kitchen... 1 by mouth once daily  Labs Reviewed: SGOT: 18 (05/12/2010)   SGPT: 14 (05/12/2010)   HDL:46.90 (05/12/2010), 38.10 (05/14/2009)  LDL:109 (05/12/2010), 85 (60/45/4098)  Chol:183 (05/12/2010), 155 (05/14/2009)  Trig:137.0 (05/12/2010), 158.0 (05/14/2009)  Complete Medication List: 1)  Lipitor 10 Mg Tabs (Atorvastatin calcium) .Marland Kitchen.. 1 by mouth once daily 2)  D 1000 Plus Tabs (Fa-cyanocobalamin-b6-d-ca) .Marland Kitchen.. 1 daily by mouth 3)  Centrum Silver Tabs (Multiple vitamins-minerals) .Marland Kitchen.. 1 daily by mouth 4)  Correctal Tablet  .Marland Kitchen.. 1 daily by mouth as needed 5)  Vitamin D 2000 Unit Tabs (Cholecalciferol) .... .take 1 tablet by mouth once a day  Patient Instructions: 1)  labs today for potassium  2)  try to stick to low cholesterol diet -- stick to low fat mayo (you can raise your HDL (good cholesterol) by increasing exercise and eating omega 3 fatty acid supplement like fish oil or flax seed oil over the counter 3)  you can lower LDL (bad cholesterol) by limiting saturated fats in diet like red meat, fried foods, egg yolks, fatty breakfast meats, high fat dairy products and shellfish ) 4)   follow up in 6 months - to evaluate blood pressure again  5)  don't forget to get your mammogram   Current Allergies (reviewed today): ! CODEINE

## 2010-11-10 NOTE — Letter (Signed)
Summary: Generic Letter  [facility]  [address] [place], [place] [address]   Phone: [phone]  Fax: [phone]    05/19/2009    [name] [address] [place], [place]  [address]     Dear [name],     Your mammogram was normal, please repeat screening in one year.     Sincerely,   Liane Comber CMA

## 2010-11-10 NOTE — Miscellaneous (Signed)
Summary: pap results   Clinical Lists Changes  Observations: Added new observation of PAP SMEAR: normal (03/18/2008 8:13)       Preventive Care Screening  Pap Smear:    Date:  03/18/2008    Results:  normal

## 2010-11-10 NOTE — Miscellaneous (Signed)
Summary: mammo results   Clinical Lists Changes  Observations: Added new observation of MAMMO DUE: 04/2009 (04/15/2008 8:26) Added new observation of MAMMOGRAM: normal (04/15/2008 8:26)       Preventive Care Screening  Mammogram:    Date:  04/15/2008    Next Due:  04/2009    Results:  normal

## 2010-11-10 NOTE — Assessment & Plan Note (Signed)
Summary: MED REFILL/ LABS???/[facility]  Medications Added LIPITOR 10 MG  TABS (ATORVASTATIN CALCIUM) 1 by mouth once daily        Vital Signs:  Patient Profile:   70 Years Old Female Weight:      147 pounds Temp:     98.7 degrees F oral Pulse rate:   84 / minute Pulse rhythm:   regular BP sitting:   130 / 80  (left arm) Cuff size:   regular  Vitals Entered By: Lowella Petties (December 20, 2007 8:39 AM)                 Chief Complaint:  Refill lipitor.  History of Present Illness: has been doing well   some cold with cough this season- taking some claritin  healthy diet and exercise- has joined [employer]- goes 3 days per week  also some treadmill is here to check labs for cholesterol  needs to schedule PE wants to get labs today for that   still chronic constip - amitiza does not work, still has to rely on laxatives to have bm      Current Allergies: ! CODEINE   Family History:    mother- died of cancer   Social History:    [employer] program and treadmill for exercise     Review of Systems  General      Denies fatigue and loss of appetite.  Eyes      Denies blurring.  CV      Denies chest pain or discomfort and palpitations.  Resp      Denies shortness of breath.  GI      Denies bloody stools and dark tarry stools.  Derm      Denies rash.  Psych      mood has been ok  Endo      Denies excessive thirst and excessive urination.   Physical Exam  General:     Well-developed,well-nourished,in no acute distress; alert,appropriate and cooperative throughout examination Head:     normocephalic, atraumatic, and no abnormalities observed.   Eyes:     vision grossly intact, pupils equal, pupils round, and pupils reactive to light.   Neck:     supple with full rom and no masses or thyromegally, no JVD or carotid bruit  Lungs:     Normal respiratory effort, chest expands symmetrically. Lungs are clear to auscultation, no crackles or  wheezes. Heart:     Normal rate and regular rhythm. S1 and S2 normal without gallop, murmur, click, rub or other extra sounds. Abdomen:     soft, non-tender, normal bowel sounds, and no masses.   Pulses:     R and L carotid,radial,femoral,dorsalis pedis and posterior tibial pulses are full and equal bilaterally Extremities:     No clubbing, cyanosis, edema, or deformity noted with normal full range of motion of all joints.   Neurologic:     sensation intact to light touch, gait normal, and DTRs symmetrical and normal.   Skin:     Intact without suspicious lesions or rashes Cervical Nodes:     No lymphadenopathy noted Psych:     normal affect, talkative and pleasant     Impression & Recommendations:  Problem # 1:  HYPERLIPIDEMIA (ICD-272.4) Assessment: Unchanged suspect control remains good- statin and diet and exercise labs today and f/u for check up Her updated medication list for this problem includes:    Lipitor 10 Mg Tabs (Atorvastatin calcium) .Marland Kitchen... 1 by mouth once daily  Problem # 2:  HYPERGLYCEMIA (ICD-790.29) Assessment: Unchanged check AIC with labs good diet and exercise and wt control Orders: Venipuncture (81191) TLB-A1C / Hgb A1C (Glycohemoglobin) (83036-A1C)   Problem # 3:  CONSTIPATION, CHRONIC (ICD-564.09) Assessment: Unchanged will check tsh with labs will continue miralax prn Orders: Venipuncture ([address]) TLB-TSH (Thyroid Stimulating Hormone) (84443-TSH)   Problem # 4:  OSTEOPENIA (ICD-733.90) Assessment: Unchanged pt choose to stop actonel will check vit D level rec made for ca and vit D The following medications were removed from the medication list:    Actonel 35 Mg Tabs (Risedronate sodium) .Marland Kitchen... Take one by mouth weekly  Orders: Venipuncture (56213) TLB-TSH (Thyroid Stimulating Hormone) (84443-TSH) T-Vitamin D (25-Hydroxy) ([address]657-84696)   Complete Medication List: 1)  Lipitor 10 Mg Tabs (Atorvastatin calcium) .Marland Kitchen.. 1 by mouth once  daily  Other Orders: TLB-Lipid Panel (80061-LIPID) TLB-Renal Function Panel (80069-RENAL) TLB-Hepatic/Liver Function Pnl (80076-HEPATIC)   Patient Instructions: 1)  schedule for 30 minute check up 2)  keep up the good work with diet and exercise 3)  the current recommendation for calcium intake is 1200-1500 mg daily with [phone] IU of vitamin D    Prescriptions: LIPITOR 10 MG  TABS (ATORVASTATIN CALCIUM) 1 by mouth once daily  #90 x 3   Entered and Authorized by:   Judith Part MD   Signed by:   Judith Part MD on 12/20/2007   Method used:   Print then Give to Patient   RxID:   [id number]  ]

## 2010-11-10 NOTE — Progress Notes (Signed)
Summary: Shingles?  Phone Note Call from Patient   Caller: Patient [phone] Summary of Call: Pt called stating that blisters from last OV had started to dry up but she has had a break out of new, more itchy blisters. Pt is unsure if this is norma l or possibly SE of medications. Pt states rash is simmilar except that these scratch more. If is is shingles pt is requesting refill of Valtrex, please advise? Initial call taken by: Margaret Pyle, CMA,  April 12, 2010 4:16 PM  Follow-up for Phone Call        i can not tell for sure if this is shingles without seeing the outbreak - but it is ok to refill valtrex and use for one more week as before - if continued new itching or more blisters, should return for recheck as may need other medications and/ or dermatology evaluation - thanks Follow-up by: Newt Lukes MD,  April 12, 2010 4:42 PM  Additional Follow-up for Phone Call Additional follow up Details #1::        pt informed and will monitor for new or contiuned sxs and will re-sch if needed in 1 week Additional Follow-up by: Margaret Pyle, CMA,  April 12, 2010 4:50 PM    New/Updated Medications: VALTREX 1 GM TABS (VALACYCLOVIR HCL) 1 by mouth three times a day Prescriptions: VALTREX 1 GM TABS (VALACYCLOVIR HCL) 1 by mouth three times a day  #21 x 0   Entered by:   Margaret Pyle, CMA   Authorized by:   Newt Lukes MD   Signed by:   Margaret Pyle, CMA on 04/12/2010   Method used:   Electronically to        [facility] (retail)       [address]       [place], [place]  [address]       Ph: [phone] or [phone]       Fax: [phone]   RxID:   [id number]

## 2010-11-10 NOTE — Progress Notes (Signed)
Summary: Shngles/shoulder pain  Phone Note Call from Patient Call back at Work Phone [phone] Call back at [phone]   Summary of Call: Patient left message on triage that she was seen for shingles and despite taking her meds she still has a great deal of pain with her right shoulder that keeps her awake at night. Patient takes the Lyrica at night, due to work she cannot take it during the day. Patient was wondering if she could receive a cortisone shot or etc. to help. Please advise. Initial call taken by: Lucious Groves CMA,  April 25, 2010 11:50 AM  Follow-up for Phone Call        i do not know of steroid shot being given for the shingles pain, esp this far out from the outbreak - can try taking nurontin during day in place of daytime lyrica as neurontin less likely to cause sedation - erx done - thanks Follow-up by: Newt Lukes MD,  April 25, 2010 4:53 PM  Additional Follow-up for Phone Call Additional follow up Details #1::        pt informed Additional Follow-up by: Margaret Pyle, CMA,  April 26, 2010 8:46 AM    New/Updated Medications: GABAPENTIN 300 MG CAPS (GABAPENTIN) 1 by mouth two times a day as needed for pain Prescriptions: GABAPENTIN 300 MG CAPS (GABAPENTIN) 1 by mouth two times a day as needed for pain  #60 x 2   Entered and Authorized by:   Newt Lukes MD   Signed by:   Newt Lukes MD on 04/25/2010   Method used:   Electronically to        [facility] (retail)       [address]       [name], [place]  [address]       Ph: [phone] or [phone]       Fax: [phone]   RxID:   [id number]

## 2010-12-15 ENCOUNTER — Ambulatory Visit: Payer: Self-pay | Admitting: Family Medicine

## 2011-01-10 ENCOUNTER — Ambulatory Visit: Payer: Self-pay | Admitting: Family Medicine

## 2011-01-20 ENCOUNTER — Ambulatory Visit: Payer: Self-pay | Admitting: Family Medicine

## 2011-01-21 ENCOUNTER — Encounter: Payer: Self-pay | Admitting: Family Medicine

## 2011-01-27 ENCOUNTER — Encounter: Payer: Self-pay | Admitting: Family Medicine

## 2011-01-27 ENCOUNTER — Ambulatory Visit (INDEPENDENT_AMBULATORY_CARE_PROVIDER_SITE_OTHER): Payer: Medicare Other | Admitting: Family Medicine

## 2011-01-27 DIAGNOSIS — M8580 Other specified disorders of bone density and structure, unspecified site: Secondary | ICD-10-CM

## 2011-01-27 DIAGNOSIS — Z780 Asymptomatic menopausal state: Secondary | ICD-10-CM | POA: Insufficient documentation

## 2011-01-27 DIAGNOSIS — M949 Disorder of cartilage, unspecified: Secondary | ICD-10-CM

## 2011-01-27 DIAGNOSIS — M899 Disorder of bone, unspecified: Secondary | ICD-10-CM

## 2011-01-27 DIAGNOSIS — I10 Essential (primary) hypertension: Secondary | ICD-10-CM | POA: Insufficient documentation

## 2011-01-27 DIAGNOSIS — M810 Age-related osteoporosis without current pathological fracture: Secondary | ICD-10-CM | POA: Insufficient documentation

## 2011-01-27 DIAGNOSIS — E785 Hyperlipidemia, unspecified: Secondary | ICD-10-CM

## 2011-01-27 DIAGNOSIS — IMO0001 Reserved for inherently not codable concepts without codable children: Secondary | ICD-10-CM

## 2011-01-27 DIAGNOSIS — R7309 Other abnormal glucose: Secondary | ICD-10-CM

## 2011-01-27 DIAGNOSIS — Z23 Encounter for immunization: Secondary | ICD-10-CM

## 2011-01-27 DIAGNOSIS — R030 Elevated blood-pressure reading, without diagnosis of hypertension: Secondary | ICD-10-CM

## 2011-01-27 DIAGNOSIS — E875 Hyperkalemia: Secondary | ICD-10-CM

## 2011-01-27 LAB — COMPREHENSIVE METABOLIC PANEL
ALT: 15 U/L (ref 0–35)
AST: 21 U/L (ref 0–37)
Albumin: 4.1 g/dL (ref 3.5–5.2)
Alkaline Phosphatase: 91 U/L (ref 39–117)
BUN: 18 mg/dL (ref 6–23)
CO2: 29 mEq/L (ref 19–32)
Calcium: 9.8 mg/dL (ref 8.4–10.5)
Chloride: 103 mEq/L (ref 96–112)
Creatinine, Ser: 0.9 mg/dL (ref 0.4–1.2)
GFR: 64.43 mL/min (ref >60.00)
Glucose, Bld: 98 mg/dL (ref 70–99)
Potassium: 4.8 mEq/L (ref 3.5–5.1)
Sodium: 141 mEq/L (ref 135–145)
Total Bilirubin: 0.4 mg/dL (ref 0.3–1.2)
Total Protein: 6.8 g/dL (ref 6.0–8.3)

## 2011-01-27 LAB — CBC WITH DIFFERENTIAL/PLATELET
Basophils Absolute: 0 10*3/uL (ref 0.0–0.1)
Basophils Relative: 0.4 % (ref 0.0–3.0)
Eosinophils Absolute: 0.2 10*3/uL (ref 0.0–0.7)
Eosinophils Relative: 3 % (ref 0.0–5.0)
HCT: 40.4 % (ref 36.0–46.0)
Hemoglobin: 13.9 g/dL (ref 12.0–15.0)
Lymphocytes Relative: 26.1 % (ref 12.0–46.0)
Lymphs Abs: 1.8 10*3/uL (ref 0.7–4.0)
MCHC: 34.4 g/dL (ref 30.0–36.0)
MCV: 93 fl (ref 78.0–100.0)
Monocytes Absolute: 0.3 10*3/uL (ref 0.1–1.0)
Monocytes Relative: 4.4 % (ref 3.0–12.0)
Neutro Abs: 4.6 10*3/uL (ref 1.4–7.7)
Neutrophils Relative %: 66.1 % (ref 43.0–77.0)
Platelets: 209 10*3/uL (ref 150.0–400.0)
RBC: 4.35 Mil/uL (ref 3.87–5.11)
RDW: 12.9 % (ref 11.5–14.6)
WBC: 7 10*3/uL (ref 4.5–10.5)

## 2011-01-27 LAB — LIPID PANEL
Cholesterol: 194 mg/dL (ref 0–200)
HDL: 44.5 mg/dL (ref >39.00)
LDL Cholesterol: 122 mg/dL — ABNORMAL HIGH (ref 0–99)
Total CHOL/HDL Ratio: 4
Triglycerides: 139 mg/dL (ref 0.0–149.0)
VLDL: 27.8 mg/dL (ref 0.0–40.0)

## 2011-01-27 LAB — HEMOGLOBIN A1C: Hgb A1c MFr Bld: 5.9 % (ref 4.6–6.5)

## 2011-01-27 LAB — TSH: TSH: 2.64 u[IU]/mL (ref 0.35–5.50)

## 2011-01-27 MED ORDER — ATORVASTATIN CALCIUM 10 MG PO TABS: 10.0000 mg | ORAL_TABLET | Freq: Every day | ORAL | Status: DC

## 2011-01-27 NOTE — Assessment & Plan Note (Signed)
sched dexa  Continue ca and vit D Check D level Rev at 3 mo visit

## 2011-01-27 NOTE — Progress Notes (Signed)
Subjective:    Patient ID: [name], female    DOB: 09/09/1941, 70 y.o.   MRN: [id number]  HPI Here for check up of chronic med problems and to rev health mt list  Wt is stable with bmi of 25  Lipids last in fall controlled with statin and LDL 109 Is watching diet fairly - not always optimal - wants to loose wt  Exercises sporatically  Walks 1/2 mile every day   Lab Results  Component Value Date   CHOL 183 05/12/2010   CHOL 155 05/14/2009   CHOL 167 12/20/2007   Lab Results  Component Value Date   HDL 46.90 05/12/2010   HDL 38.10* 05/14/2009   HDL 86.5* 12/20/2007   Lab Results  Component Value Date   LDLCALC 109* 05/12/2010   LDLCALC 85 05/14/2009   LDLCALC 111* 12/20/2007   Lab Results  Component Value Date   TRIG 137.0 05/12/2010   TRIG 158.0* 05/14/2009   TRIG 107 12/20/2007   Lab Results  Component Value Date   CHOLHDL 4 05/12/2010   CHOLHDL 4 05/14/2009   CHOLHDL 4.8 CALC 12/20/2007    K was briefly up - came down to nl last check (eating a lot of K foods)  No cramping/ cp or palpitations   Hyperglycemia - is watching sweets and no sweet drinks   Elevated bp last visit was 146/90-- today 140/82  No ha or cp  This has been borderline    dexa- osteopenia - ? 06 - none since   Zoster-- had shingles this past year , is interested in that  Still has pain in her neck and shoulder -then bursitis    Pneumovax-not interested in that  Flu shot declines as well   Td03 - is interested in updating that today  Mam 9/11 No lumps or problems   Pap- 2 years ago  Never had abn pap No std or new partners    colonosc 3/08 with tics  Told 5 years -- due next year   Past Medical History  Diagnosis Date  . Osteopenia   . Back pain   . Hyperlipidemia   . Hypertension   . Chronic constipation    Past Surgical History  Procedure Date  . Cleft palate repair   . Tubal ligation   . Nephrectomy living donor     Left  . Foot neuroma surgery 2/06    reports that she  has never smoked. She does not have any smokeless tobacco history on file. She reports that she does not drink alcohol. Her drug history not on file. family history includes Colon cancer in her mother; Coronary artery disease in her father and mother; Dementia in her mother; Diabetes in her mother; Heart failure in her father; Hypertension in her father; and Osteoporosis in her mother. Allergies  Allergen Reactions  . Codeine         Review of Systems Review of Systems  Constitutional: Negative for fever, appetite change, fatigue and unexpected weight change.  Eyes: Negative for pain and visual disturbance.  Respiratory: Negative for cough and shortness of breath.   Cardiovascular: Negative.   Gastrointestinal: Negative for nausea, diarrhea and constipation.  Genitourinary: Negative for urgency and frequency.  Skin: Negative for pallor.  Neurological: Negative for weakness, light-headedness, numbness and headaches.  Hematological: Negative for adenopathy. Does not bruise/bleed easily.  Psychiatric/Behavioral: Negative for dysphoric mood. The patient is not nervous/anxious.         Objective:   Physical  Exam  Constitutional: She appears well-developed and well-nourished.  HENT:  Head: Normocephalic.  Right Ear: External ear normal.  Left Ear: External ear normal.  Mouth/Throat: Oropharynx is clear and moist.  Eyes: Conjunctivae and EOM are normal. Pupils are equal, round, and reactive to light.  Neck: Normal range of motion. Neck supple. No JVD present. Carotid bruit is not present.  Cardiovascular: Normal rate, regular rhythm and normal heart sounds.   Pulmonary/Chest: Effort normal and breath sounds normal. She has no wheezes.  Abdominal: Soft. Bowel sounds are normal. She exhibits no abdominal bruit.  Genitourinary: No breast swelling, tenderness, discharge or bleeding.  Musculoskeletal: She exhibits no edema and no tenderness.  Lymphadenopathy:    She has no cervical  adenopathy.  Neurological: She displays normal reflexes.  Skin: Skin is warm and dry. No rash noted. No erythema. No pallor.  Psychiatric: She has a normal mood and affect.          Assessment & Plan:

## 2011-01-27 NOTE — Assessment & Plan Note (Signed)
This needs to be followed Disc lifestyle change- wt loss/ exercise/low na diet Re check 3 mo  If not imp- will tx  Lab today

## 2011-01-27 NOTE — Patient Instructions (Signed)
Check with your insurance and call us if you want a shingle vaccine  Tdap shot today We will schedule bone density test at check out Labs today Keep working on healthy diet and exercise Follow up in 3 months for blood pressure and also to review your bone density test

## 2011-01-27 NOTE — Assessment & Plan Note (Signed)
A1c today Disc low glycemic diet and exercise and wt loss today

## 2011-01-27 NOTE — Assessment & Plan Note (Signed)
Labs today Has been well controlled on statin  Rev low sat fat diet today F/u 3 mo

## 2011-01-28 LAB — VITAMIN D 25 HYDROXY (VIT D DEFICIENCY, FRACTURES): Vit D, 25-Hydroxy: 54 ng/mL (ref 30–89)

## 2011-02-17 ENCOUNTER — Ambulatory Visit
Admission: RE | Admit: 2011-02-17 | Discharge: 2011-02-17 | Disposition: A | Discharge status: Home / Self Care | Payer: Medicare Other | Source: Ambulatory Visit | Attending: Family Medicine | Admitting: Family Medicine

## 2011-02-17 ENCOUNTER — Inpatient Hospital Stay: Admission: RE | Admit: 2011-02-17 | Payer: Medicare Other | Source: Ambulatory Visit

## 2011-02-17 DIAGNOSIS — Z780 Asymptomatic menopausal state: Secondary | ICD-10-CM

## 2011-02-17 DIAGNOSIS — M8580 Other specified disorders of bone density and structure, unspecified site: Secondary | ICD-10-CM

## 2011-02-24 NOTE — Assessment & Plan Note (Signed)
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 Milinda Antis for evaluation of chronic refractory  constipation.   So long as [name] can remember, she has been constipated, and even  received enemas in childhood.  She will not have a bowel movement for up  to 2 weeks if she does not take stimulant laxatives, she currently is  taking 2 Dulcolax a day.  She has no real abdominal pain, but gets  abdominal gas and bloating if she is constipated.  She does have some  bright red blood with straining, and has suspected hemorrhoids.  She  tries to eat a high fiber diet and uses liberal p.o. fluids.  She has  tried over-the-counter MiraLax without improvement.  She has no other  neurological problems such as swallowing difficulties or bladder  emptying problems, or other neurological difficulties.  Recent lab data  per Dr. Milinda Antis showed a normal CBC, metabolic profile, and thyroid  function test.  She had a previous negative screening colonoscopy by  myself in August of 2003.  She denies any specific food intolerances.  She has had no anorexia or weight loss.   PAST MEDICAL HISTORY:  Remarkable for hyperlipidemia, but otherwise is  negative.  She has had previous tubal ligation.   FAMILY HISTORY:  Remarkable for her mother had colon cancer at age 17.   SOCIAL HISTORY:  She is married, but lives by herself.  She has some  business school education.  She does not smoke or use ethanol.   REVIEW OF SYSTEMS:  Noncontributory.   MEDICATIONS:  1. Lipitor 10 mg a day.  2. Boniva once a month.  3. Calcium with vitamin D daily.  4. Dulcolax tabs.   She denies drug allergies.   She is an attractive,  healthy-appearing white female in no distress,  appears stated age.  She is 5 feet 3 inches tall, and weight is 150 pounds.  Blood pressure  130/72, and pulse was 84 and regular.  I could not appreciate the stigmata of chronic liver disease or  thyromegaly.  CHEST:  Entirely clear, there were no murmurs, gallops, or rubs.  She  was in a regular rhythm.  There is no abdominal distension, organomegaly, masses or tenderness.  Bowel sounds were normal.  Her upper extremities were unremarkable.  MENTAL STATUS:  Clear.  RECTAL EXAM:  Deferred.   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  There is no reason to suspect underlying obstructive or  metabolic problems.   RECOMMENDATIONS:  1. Trial of Amitiza 24 mcg twice a day with meals as tolerated.  I      have warned her about possible nausea and the need to adjust her      medications based on this symptom.  2. MiraLax p.r.n. use at  bedtime.  3. Followup colonoscopy exam at her convenience.     Vania Rea. Jarold Motto, MD, Caleen Essex, FAGA  Electronically Signed    DRP/MedQ  DD: 12/07/2006  DT: 12/07/2006  Job #: [id number]   cc:   Marne A. Milinda Antis, MD

## 2011-03-01 ENCOUNTER — Encounter: Payer: Self-pay | Admitting: Family Medicine

## 2011-05-05 ENCOUNTER — Ambulatory Visit: Payer: Medicare Other | Admitting: Family Medicine

## 2011-05-12 ENCOUNTER — Encounter: Payer: Self-pay | Admitting: Family Medicine

## 2011-05-12 ENCOUNTER — Ambulatory Visit (INDEPENDENT_AMBULATORY_CARE_PROVIDER_SITE_OTHER): Payer: Medicare Other | Admitting: Family Medicine

## 2011-05-12 DIAGNOSIS — R7309 Other abnormal glucose: Secondary | ICD-10-CM

## 2011-05-12 DIAGNOSIS — M949 Disorder of cartilage, unspecified: Secondary | ICD-10-CM

## 2011-05-12 DIAGNOSIS — R030 Elevated blood-pressure reading, without diagnosis of hypertension: Secondary | ICD-10-CM

## 2011-05-12 DIAGNOSIS — IMO0001 Reserved for inherently not codable concepts without codable children: Secondary | ICD-10-CM

## 2011-05-12 DIAGNOSIS — M899 Disorder of bone, unspecified: Secondary | ICD-10-CM

## 2011-05-12 NOTE — Assessment & Plan Note (Signed)
Better today at 132/82 Urged to keep up exercise and weight loss

## 2011-05-12 NOTE — Assessment & Plan Note (Signed)
Reviewed dexa with pt  Overall unchanged from previous-mild osteopenia  Urged to continue ca and D and exercise D level good at 54

## 2011-05-12 NOTE — Assessment & Plan Note (Signed)
a1c good 5.9 Continue to watch this  Also disc low glycemic diet  Lost 2 lb - commended

## 2011-05-12 NOTE — Progress Notes (Signed)
Subjective:    Patient ID: [name], female    DOB: 1940/10/27, 70 y.o.   MRN: [id number]  HPI Here for f/u of HTN and osteopenia and hyperglycemia  Is feeling good   Wt is up 2 lb  bp better today at 132/82 Is doing better overall  Does get exercise - walking regularly   dexa was fairly stable  LS -1.3 and FN -.9 T scores  Not much change Vit D level is 54- good  Is taking calcium and vitamin D  Thinks 1000 iu vit D plus ca and mvi and fish oil    a1c was 5.9 Does watch her diet fairly closely  No inc thirst or urination  Exercise is good  Patient Active Problem List  Diagnoses  . HYPERLIPIDEMIA  . CONSTIPATION, CHRONIC  . BACK PAIN  . OSTEOPENIA  . HYPERGLYCEMIA  . Elevated blood pressure  . Post-menopausal   Past Medical History  Diagnosis Date  . Osteopenia   . Back pain   . Hyperlipidemia   . Hypertension   . Chronic constipation    Past Surgical History  Procedure Date  . Cleft palate repair   . Tubal ligation   . Nephrectomy living donor     Left  . Foot neuroma surgery 2/06   History  Substance Use Topics  . Smoking status: Never Smoker   . Smokeless tobacco: Not on file   Comment: smoked 1 year after high school, never since  . Alcohol Use: No   Family History  Problem Relation Age of Onset  . Colon cancer Mother   . Diabetes Mother   . Coronary artery disease Mother   . Osteoporosis Mother   . Dementia Mother   . Coronary artery disease Father   . Heart failure Father   . Hypertension Father    Allergies  Allergen Reactions  . Codeine    Current Outpatient Prescriptions on File Prior to Visit  Medication Sig Dispense Refill  . atorvastatin (LIPITOR) 10 MG tablet Take 1 tablet (10 mg total) by mouth daily.  90 tablet  3  . bisacodyl (DULCOLAX) 5 MG EC tablet Take 5 mg by mouth daily as needed.        . Calcium Carbonate (CALCIUM 500 PO) Take 500 mg by mouth daily.        . Cholecalciferol (D 1000) 1000 UNITS tablet Take  1,000 Units by mouth daily.        . Multiple Vitamins-Minerals (CENTRUM SILVER) tablet Take 1 tablet by mouth daily.            Review of Systems Review of Systems  Constitutional: Negative for fever, appetite change, fatigue and unexpected weight change.  Eyes: Negative for pain and visual disturbance.  Respiratory: Negative for cough and shortness of breath.   Cardiovascular: Negative. For cp or sob or palpitations  Gastrointestinal: Negative for nausea, diarrhea and constipation.  Genitourinary: Negative for urgency and frequency.  Skin: Negative for pallor. or rash  Neurological: Negative for weakness, light-headedness, numbness and headaches.  Hematological: Negative for adenopathy. Does not bruise/bleed easily.  Psychiatric/Behavioral: Negative for dysphoric mood. The patient is not nervous/anxious.          Objective:   Physical Exam  Constitutional: She appears well-nourished. No distress.  HENT:  Head: Normocephalic and atraumatic.  Mouth/Throat: Oropharynx is clear and moist.  Eyes: Conjunctivae and EOM are normal. Pupils are equal, round, and reactive to light.  Neck: Normal range of motion.  Neck supple. No JVD present. Carotid bruit is not present. No thyromegaly present.  Cardiovascular: Normal rate, regular rhythm, normal heart sounds and intact distal pulses.   Pulmonary/Chest: Effort normal and breath sounds normal. No respiratory distress. She has no wheezes.  Abdominal: Soft. Bowel sounds are normal. There is no tenderness.  Musculoskeletal: She exhibits no edema and no tenderness.       No kyphosis or acute joint changes  Lymphadenopathy:    She has no cervical adenopathy.  Neurological: She is alert. She has normal reflexes. Coordination normal.  Skin: Skin is warm. No rash noted. No erythema. No pallor.  Psychiatric: She has a normal mood and affect.          Assessment & Plan:

## 2011-05-12 NOTE — Patient Instructions (Signed)
Blood pressure is better today Eat a low salt diet and keep walking! Also bone density is about the same -mild osteopenia  Continue calcium and vitamin D

## 2012-02-15 ENCOUNTER — Other Ambulatory Visit: Payer: Self-pay | Admitting: *Deleted

## 2012-02-15 MED ORDER — ATORVASTATIN CALCIUM 10 MG PO TABS: 10.0000 mg | ORAL_TABLET | Freq: Every day | ORAL | Status: DC

## 2012-02-15 NOTE — Telephone Encounter (Signed)
OK to refill? Last labs 4/12 and last OV 8/12.

## 2012-02-15 NOTE — Telephone Encounter (Signed)
Please schedule lab and f/u when able Will refill electronically

## 2012-02-16 NOTE — Telephone Encounter (Signed)
Message left on home machine advising patient to call and schedule fasting labs and OV.

## 2012-03-08 ENCOUNTER — Other Ambulatory Visit (INDEPENDENT_AMBULATORY_CARE_PROVIDER_SITE_OTHER): Payer: Medicare Other

## 2012-03-08 ENCOUNTER — Telehealth: Payer: Self-pay | Admitting: Family Medicine

## 2012-03-08 DIAGNOSIS — E785 Hyperlipidemia, unspecified: Secondary | ICD-10-CM

## 2012-03-08 DIAGNOSIS — IMO0001 Reserved for inherently not codable concepts without codable children: Secondary | ICD-10-CM

## 2012-03-08 DIAGNOSIS — R7309 Other abnormal glucose: Secondary | ICD-10-CM

## 2012-03-08 DIAGNOSIS — R030 Elevated blood-pressure reading, without diagnosis of hypertension: Secondary | ICD-10-CM

## 2012-03-08 DIAGNOSIS — M899 Disorder of bone, unspecified: Secondary | ICD-10-CM

## 2012-03-08 DIAGNOSIS — M949 Disorder of cartilage, unspecified: Secondary | ICD-10-CM

## 2012-03-08 LAB — CBC WITH DIFFERENTIAL/PLATELET
Basophils Absolute: 0 10*3/uL (ref 0.0–0.1)
Basophils Relative: 0.7 % (ref 0.0–3.0)
Eosinophils Absolute: 0.2 10*3/uL (ref 0.0–0.7)
Eosinophils Relative: 3 % (ref 0.0–5.0)
HCT: 39.9 % (ref 36.0–46.0)
Hemoglobin: 13.2 g/dL (ref 12.0–15.0)
Lymphocytes Relative: 21.8 % (ref 12.0–46.0)
Lymphs Abs: 1.5 10*3/uL (ref 0.7–4.0)
MCHC: 33.1 g/dL (ref 30.0–36.0)
MCV: 92.7 fl (ref 78.0–100.0)
Monocytes Absolute: 0.4 10*3/uL (ref 0.1–1.0)
Monocytes Relative: 5.5 % (ref 3.0–12.0)
Neutro Abs: 4.8 10*3/uL (ref 1.4–7.7)
Neutrophils Relative %: 69 % (ref 43.0–77.0)
Platelets: 194 10*3/uL (ref 150.0–400.0)
RBC: 4.31 Mil/uL (ref 3.87–5.11)
RDW: 12.8 % (ref 11.5–14.6)
WBC: 6.9 10*3/uL (ref 4.5–10.5)

## 2012-03-08 LAB — COMPREHENSIVE METABOLIC PANEL
ALT: 15 U/L (ref 0–35)
AST: 22 U/L (ref 0–37)
Albumin: 4.2 g/dL (ref 3.5–5.2)
Alkaline Phosphatase: 84 U/L (ref 39–117)
BUN: 19 mg/dL (ref 6–23)
CO2: 28 mEq/L (ref 19–32)
Calcium: 9.4 mg/dL (ref 8.4–10.5)
Chloride: 106 mEq/L (ref 96–112)
Creatinine, Ser: 1 mg/dL (ref 0.4–1.2)
GFR: 56.37 mL/min — ABNORMAL LOW (ref >60.00)
Glucose, Bld: 94 mg/dL (ref 70–99)
Potassium: 4.5 mEq/L (ref 3.5–5.1)
Sodium: 142 mEq/L (ref 135–145)
Total Bilirubin: 0.6 mg/dL (ref 0.3–1.2)
Total Protein: 6.8 g/dL (ref 6.0–8.3)

## 2012-03-08 LAB — LIPID PANEL
Cholesterol: 145 mg/dL (ref 0–200)
HDL: 42.6 mg/dL (ref >39.00)
LDL Cholesterol: 83 mg/dL (ref 0–99)
Total CHOL/HDL Ratio: 3
Triglycerides: 95 mg/dL (ref 0.0–149.0)
VLDL: 19 mg/dL (ref 0.0–40.0)

## 2012-03-08 LAB — HEMOGLOBIN A1C: Hgb A1c MFr Bld: 6 % (ref 4.6–6.5)

## 2012-03-08 LAB — TSH: TSH: 2.81 u[IU]/mL (ref 0.35–5.50)

## 2012-03-08 NOTE — Telephone Encounter (Signed)
Message copied by Judy Pimple on Fri Mar 08, 2012 10:39 AM ------      Message from: Baldomero Lamy      Created: Fri Mar 08, 2012  9:42 AM      Regarding: Cpx labs this am       Please order  future cpx labs for pt's upcomming lab appt.      Thanks      Rodney Booze

## 2012-03-09 LAB — VITAMIN D 25 HYDROXY (VIT D DEFICIENCY, FRACTURES): Vit D, 25-Hydroxy: 50 ng/mL (ref 30–89)

## 2012-03-15 ENCOUNTER — Ambulatory Visit (INDEPENDENT_AMBULATORY_CARE_PROVIDER_SITE_OTHER): Payer: Medicare Other | Admitting: Family Medicine

## 2012-03-15 ENCOUNTER — Encounter: Payer: Self-pay | Admitting: Family Medicine

## 2012-03-15 VITALS — BP 138/84 | HR 80 | Temp 98.0°F | Ht 63.0 in | Wt 146.8 lb

## 2012-03-15 DIAGNOSIS — E785 Hyperlipidemia, unspecified: Secondary | ICD-10-CM

## 2012-03-15 DIAGNOSIS — R7309 Other abnormal glucose: Secondary | ICD-10-CM

## 2012-03-15 MED ORDER — ATORVASTATIN CALCIUM 10 MG PO TABS: 10.0000 mg | ORAL_TABLET | Freq: Every day | ORAL | Status: DC

## 2012-03-15 NOTE — Progress Notes (Signed)
Subjective:    Patient ID: [name], female    DOB: Dec 27, 1940, 71 y.o.   MRN: [id number]  HPI Here for f/u of labs for chronic conditions  Is doing well  Nothing new going on    bp ok today 138/84 BP Readings from Last 3 Encounters:  03/15/12 138/84  05/12/11 132/82  01/27/11 140/82   has been up in the past   Wt stable with bmi of 26 Is taking good care of herself  [facility]  [place] generally [place]   a1c is 6.0 up from 5.9 Hyperglycemia- works on low sugar diet  Uses splenda instead of sugar  Sugar free products   Lipids - statin and diet Lab Results  Component Value Date   CHOL 145 03/08/2012   CHOL 194 01/27/2011   CHOL 183 05/12/2010   Lab Results  Component Value Date   HDL 42.60 03/08/2012   HDL 78.29 01/27/2011   HDL 56.21 05/12/2010   Lab Results  Component Value Date   LDLCALC 83 03/08/2012   LDLCALC 122* 01/27/2011   LDLCALC 109* 05/12/2010   Lab Results  Component Value Date   TRIG 95.0 03/08/2012   TRIG 139.0 01/27/2011   TRIG 137.0 05/12/2010   Lab Results  Component Value Date   CHOLHDL 3 03/08/2012   CHOLHDL 4 01/27/2011   CHOLHDL 4 05/12/2010   No results found for this basename: LDLDIRECT   taking the generic to lipitor-tolerates it well  Is working well  Tries hard to stay away from high fat foods    Vit D level is 50= does take her vit D and is outside a lot   She declines flu shot  Declines pneumovax   Patient Active Problem List  Diagnoses  . HYPERLIPIDEMIA  . CONSTIPATION, CHRONIC  . BACK PAIN  . OSTEOPENIA  . HYPERGLYCEMIA  . Elevated blood pressure  . Post-menopausal   Past Medical History  Diagnosis Date  . Osteopenia   . Back pain   . Hyperlipidemia   . Hypertension   . Chronic constipation    Past Surgical History  Procedure Date  . Cleft palate repair   . Tubal ligation   . Nephrectomy living donor     Left  . Foot neuroma surgery  2/06   History  Substance Use Topics  . Smoking status: Never Smoker   . Smokeless tobacco: Not on file   Comment: smoked 1 year after high school, never since  . Alcohol Use: No   Family History  Problem Relation Age of Onset  . Colon cancer Mother   . Diabetes Mother   . Coronary artery disease Mother   . Osteoporosis Mother   . Dementia Mother   . Coronary artery disease Father   . Heart failure Father   . Hypertension Father    Allergies  Allergen Reactions  . Codeine    Current Outpatient Prescriptions on File Prior to Visit  Medication Sig Dispense Refill  . atorvastatin (LIPITOR) 10 MG tablet Take 1 tablet (10 mg total) by mouth daily.  90 tablet  3  . bisacodyl (DULCOLAX) 5 MG EC tablet Take 5 mg by mouth daily as needed.        . Calcium Carbonate (CALCIUM 500 PO) Take 500 mg by mouth daily.        . Cholecalciferol (D 1000) 1000 UNITS tablet Take  1,000 Units by mouth daily.        . Multiple Vitamins-Minerals (CENTRUM SILVER) tablet Take 1 tablet by mouth daily.        . Omega-3 Fatty Acids (FISH OIL PO) Take 1 capsule by mouth daily.            Review of Systems Review of Systems  Constitutional: Negative for fever, appetite change, fatigue and unexpected weight change.  Eyes: Negative for pain and visual disturbance.  Respiratory: Negative for cough and shortness of breath.   Cardiovascular: Negative for cp or palpitations    Gastrointestinal: Negative for nausea, diarrhea and constipation.  Genitourinary: Negative for urgency and frequency.  Skin: Negative for pallor or rash   Neurological: Negative for weakness, light-headedness, numbness and headaches.  Hematological: Negative for adenopathy. Does not bruise/bleed easily.  Psychiatric/Behavioral: Negative for dysphoric mood. The patient is not nervous/anxious.         Objective:   Physical Exam  Constitutional: She appears well-developed and well-nourished. No distress.  HENT:  Head: Normocephalic  and atraumatic.  Right Ear: External ear normal.  Left Ear: External ear normal.  Nose: Nose normal.  Mouth/Throat: Oropharynx is clear and moist.  Eyes: Conjunctivae and EOM are normal. Pupils are equal, round, and reactive to light. No scleral icterus.  Neck: Normal range of motion. Neck supple. No JVD present. No thyromegaly present.  Cardiovascular: Normal rate, regular rhythm, normal heart sounds and intact distal pulses.  Exam reveals no gallop.   Pulmonary/Chest: Effort normal and breath sounds normal. No respiratory distress. She has no wheezes.  Abdominal: Soft. Bowel sounds are normal. She exhibits no distension and no mass. There is no tenderness.  Musculoskeletal: Normal range of motion. She exhibits no edema and no tenderness.  Lymphadenopathy:    She has no cervical adenopathy.  Neurological: She is alert. She has normal reflexes. No cranial nerve deficit. She exhibits normal muscle tone. Coordination normal.  Skin: Skin is warm and dry. No rash noted. No erythema. No pallor.  Psychiatric: She has a normal mood and affect.          Assessment & Plan:

## 2012-03-15 NOTE — Patient Instructions (Signed)
Keep portions controlled and avoid junk and sweets  For sugar level - control portions of fruit too- don't overdo it  Eat high fiber items/ green vegetables and lean protein  Keep exercising - optimally 5 times per week  Follow up in 6 months for visit and lab- to watch your sugar

## 2012-03-15 NOTE — Assessment & Plan Note (Signed)
Very good control with generic lipitor and diet Disc goals for lipids and reasons to control them Rev labs with pt Rev low sat fat diet in detail  Enc good habits

## 2012-03-15 NOTE — Assessment & Plan Note (Signed)
a1c is up to 6 Enc low glycemic diet- rev with her in detail  Also exercise- will try to inc to 5 d per week

## 2012-09-20 ENCOUNTER — Encounter: Payer: Self-pay | Admitting: Family Medicine

## 2012-09-20 ENCOUNTER — Ambulatory Visit (INDEPENDENT_AMBULATORY_CARE_PROVIDER_SITE_OTHER): Payer: Medicare Other | Admitting: Family Medicine

## 2012-09-20 VITALS — BP 146/94 | HR 89 | Temp 98.9°F | Ht 63.0 in | Wt 148.0 lb

## 2012-09-20 DIAGNOSIS — I10 Essential (primary) hypertension: Secondary | ICD-10-CM

## 2012-09-20 DIAGNOSIS — R7309 Other abnormal glucose: Secondary | ICD-10-CM

## 2012-09-20 DIAGNOSIS — E785 Hyperlipidemia, unspecified: Secondary | ICD-10-CM

## 2012-09-20 DIAGNOSIS — Z23 Encounter for immunization: Secondary | ICD-10-CM

## 2012-09-20 LAB — COMPREHENSIVE METABOLIC PANEL
ALT: 22 U/L (ref 0–35)
AST: 31 U/L (ref 0–37)
Albumin: 4.2 g/dL (ref 3.5–5.2)
Alkaline Phosphatase: 94 U/L (ref 39–117)
BUN: 20 mg/dL (ref 6–23)
CO2: 29 mEq/L (ref 19–32)
Calcium: 9.6 mg/dL (ref 8.4–10.5)
Chloride: 101 mEq/L (ref 96–112)
Creatinine, Ser: 1 mg/dL (ref 0.4–1.2)
GFR: 55.66 mL/min — ABNORMAL LOW (ref >60.00)
Glucose, Bld: 109 mg/dL — ABNORMAL HIGH (ref 70–99)
Potassium: 4.5 mEq/L (ref 3.5–5.1)
Sodium: 137 mEq/L (ref 135–145)
Total Bilirubin: 0.4 mg/dL (ref 0.3–1.2)
Total Protein: 7.3 g/dL (ref 6.0–8.3)

## 2012-09-20 LAB — HEMOGLOBIN A1C: Hgb A1c MFr Bld: 6.1 % (ref 4.6–6.5)

## 2012-09-20 MED ORDER — LISINOPRIL 10 MG PO TABS: 10.0000 mg | ORAL_TABLET | Freq: Every day | ORAL | Status: DC

## 2012-09-20 NOTE — Assessment & Plan Note (Signed)
New HTN after watching bp for a while  Will start lisinopril 10  Update if side eff or problems Can check at home  Lab today  F/u 4-6 wk

## 2012-09-20 NOTE — Progress Notes (Signed)
Subjective:    Patient ID: [name], female    DOB: 1941-02-15, 71 y.o.   MRN: [id number]  HPI Here for f/u of chronic medical problems Is doing well overall   No new problems   Wt is up 1 lb with bmi of 26  Flu vaccine - will get today   bp is stable today  No cp or palpitations or headaches or edema  No medicines - was controlling with lifestyle BP Readings from Last 3 Encounters:  09/20/12 146/94  03/15/12 138/84  05/12/11 132/82    High at home also  Started taking a low dose aspirin -thinks this helped  It fluctuates     Diet- is good  Exercise - has not been exercising due to a busy schedule  This is a really bad time at work    Lab Results  Component Value Date   CHOL 145 03/08/2012   HDL 42.60 03/08/2012   LDLCALC 83 03/08/2012   TRIG 95.0 03/08/2012   CHOLHDL 3 03/08/2012   on lipitor and diet   Hyperglycemia  Lab Results  Component Value Date   HGBA1C 6.0 03/08/2012    Due for labs today   Patient Active Problem List  Diagnosis  . HYPERLIPIDEMIA  . CONSTIPATION, CHRONIC  . BACK PAIN  . OSTEOPENIA  . HYPERGLYCEMIA  . Elevated blood pressure  . Post-menopausal   Past Medical History  Diagnosis Date  . Osteopenia   . Back pain   . Hyperlipidemia   . Hypertension   . Chronic constipation    Past Surgical History  Procedure Date  . Cleft palate repair   . Tubal ligation   . Nephrectomy living donor     Left  . Foot neuroma surgery 2/06   History  Substance Use Topics  . Smoking status: Never Smoker   . Smokeless tobacco: Not on file     Comment: smoked 1 year after high school, never since  . Alcohol Use: No   Family History  Problem Relation Age of Onset  . Colon cancer Mother   . Diabetes Mother   . Coronary artery disease Mother   . Osteoporosis Mother   . Dementia Mother   . Coronary artery disease Father   . Heart failure Father   . Hypertension Father    Allergies  Allergen Reactions  . Codeine    Current  Outpatient Prescriptions on File Prior to Visit  Medication Sig Dispense Refill  . atorvastatin (LIPITOR) 10 MG tablet Take 1 tablet (10 mg total) by mouth daily.  90 tablet  3  . bisacodyl (DULCOLAX) 5 MG EC tablet Take 5 mg by mouth daily as needed.        . Calcium Carbonate (CALCIUM 500 PO) Take 500 mg by mouth daily.        . Cholecalciferol (D 1000) 1000 UNITS tablet Take 1,000 Units by mouth daily.        . Multiple Vitamins-Minerals (CENTRUM SILVER) tablet Take 1 tablet by mouth daily.        . Omega-3 Fatty Acids (FISH OIL PO) Take 1 capsule by mouth daily.          Review of Systems Review of Systems  Constitutional: Negative for fever, appetite change, fatigue and unexpected weight change.  Eyes: Negative for pain and visual disturbance.  Respiratory: Negative for cough and shortness of breath.   Cardiovascular: Negative for cp or palpitations    Gastrointestinal: Negative for nausea, diarrhea and  constipation.  Genitourinary: Negative for urgency and frequency.  Skin: Negative for pallor or rash   Neurological: Negative for weakness, light-headedness, numbness and headaches.  Hematological: Negative for adenopathy. Does not bruise/bleed easily.  Psychiatric/Behavioral: Negative for dysphoric mood. The patient is not nervous/anxious.         Objective:   Physical Exam  Constitutional: She appears well-developed and well-nourished. No distress.  HENT:  Head: Normocephalic and atraumatic.  Mouth/Throat: Oropharynx is clear and moist.  Eyes: Conjunctivae normal and EOM are normal. Pupils are equal, round, and reactive to light. No scleral icterus.  Neck: Normal range of motion. Neck supple. No JVD present. Carotid bruit is not present. No thyromegaly present.  Cardiovascular: Normal rate, regular rhythm, normal heart sounds and intact distal pulses.  Exam reveals no gallop.   Pulmonary/Chest: Breath sounds normal. No respiratory distress. She has no wheezes.  Abdominal:  Soft. Bowel sounds are normal. She exhibits no distension, no abdominal bruit and no mass. There is no tenderness.  Musculoskeletal: She exhibits no edema.  Lymphadenopathy:    She has no cervical adenopathy.  Neurological: She is alert. She has normal reflexes. No cranial nerve deficit. She exhibits normal muscle tone. Coordination normal.  Skin: Skin is warm and dry. No rash noted. No erythema. No pallor.  Psychiatric: She has a normal mood and affect.          Assessment & Plan:

## 2012-09-20 NOTE — Patient Instructions (Addendum)
Take lisinopril as directed  Continue healthy diet Get back to regular exercise  Follow up in 4-6 weeks  Flu vaccine today

## 2012-09-20 NOTE — Assessment & Plan Note (Signed)
Well controlled with statin and diet  Rev last profile and low sat fat diet Enc to get back to exercise

## 2012-09-20 NOTE — Assessment & Plan Note (Signed)
a1c today  inst to watch sugar in diet Pt did add low dose asa to meds  Follow up 6 mo

## 2012-09-23 ENCOUNTER — Encounter: Payer: Self-pay | Admitting: *Deleted

## 2012-11-01 ENCOUNTER — Ambulatory Visit (INDEPENDENT_AMBULATORY_CARE_PROVIDER_SITE_OTHER): Payer: Medicare Other | Admitting: Family Medicine

## 2012-11-01 ENCOUNTER — Encounter: Payer: Self-pay | Admitting: Family Medicine

## 2012-11-01 VITALS — BP 132/80 | HR 84 | Temp 98.4°F | Ht 63.0 in | Wt 147.2 lb

## 2012-11-01 DIAGNOSIS — I10 Essential (primary) hypertension: Secondary | ICD-10-CM

## 2012-11-01 MED ORDER — LISINOPRIL 10 MG PO TABS: 10.0000 mg | ORAL_TABLET | Freq: Every day | ORAL | Status: DC

## 2012-11-01 NOTE — Assessment & Plan Note (Signed)
This seems to be well controlled on low dose lisinopril with no side eff bp in fair control at this time  No changes needed  Disc lifstyle change with low sodium diet and exercise   F/u 6 mo for visit and will check renal profile

## 2012-11-01 NOTE — Patient Instructions (Addendum)
bp is down Stay on current medicines  Work on diet and exercise  Goal for exercise is to work up to 30 minutes or more 5 days per week  Follow up in about 6 months

## 2012-11-01 NOTE — Progress Notes (Signed)
Subjective:    Patient ID: [name], female    DOB: [birth year], 72 y.o.   MRN: [id number]  HPI Here for f/u of HTN  Started on lisinopril 10 mg daily   Doing very well overall   bp is stable today  No cp or palpitations or headaches or edema  No side effects to medicines - happy with that  BP Readings from Last 3 Encounters:  11/01/12 134/84  09/20/12 146/94  03/15/12 138/84     137/73 - on her machine at home  Does not take it really often    Patient Active Problem List  Diagnosis  . HYPERLIPIDEMIA  . CONSTIPATION, CHRONIC  . BACK PAIN  . OSTEOPENIA  . HYPERGLYCEMIA  . Hypertension  . Post-menopausal   Past Medical History  Diagnosis Date  . Osteopenia   . Back pain   . Hyperlipidemia   . Hypertension   . Chronic constipation    Past Surgical History  Procedure Date  . Cleft palate repair   . Tubal ligation   . Nephrectomy living donor     Left  . Foot neuroma surgery 2/06   History  Substance Use Topics  . Smoking status: Never Smoker   . Smokeless tobacco: Not on file     Comment: smoked 1 year after high school, never since  . Alcohol Use: No   Family History  Problem Relation Age of Onset  . Colon cancer Mother   . Diabetes Mother   . Coronary artery disease Mother   . Osteoporosis Mother   . Dementia Mother   . Coronary artery disease Father   . Heart failure Father   . Hypertension Father    Allergies  Allergen Reactions  . Codeine    Current Outpatient Prescriptions on File Prior to Visit  Medication Sig Dispense Refill  . aspirin 81 MG tablet Take 81 mg by mouth daily.      Marland Kitchen atorvastatin (LIPITOR) 10 MG tablet Take 1 tablet (10 mg total) by mouth daily.  90 tablet  3  . bisacodyl (DULCOLAX) 5 MG EC tablet Take 5 mg by mouth daily as needed.        . Calcium Carbonate (CALCIUM 500 PO) Take 500 mg by mouth daily.        . Cholecalciferol (D 1000) 1000 UNITS tablet Take 1,000 Units by mouth daily.        Marland Kitchen lisinopril  (PRINIVIL,ZESTRIL) 10 MG tablet Take 1 tablet (10 mg total) by mouth daily.  30 tablet  3  . Multiple Vitamins-Minerals (CENTRUM SILVER) tablet Take 1 tablet by mouth daily.        . Omega-3 Fatty Acids (FISH OIL PO) Take 1 capsule by mouth daily.            Review of Systems Review of Systems  Constitutional: Negative for fever, appetite change, fatigue and unexpected weight change.  Eyes: Negative for pain and visual disturbance.  Respiratory: Negative for cough and shortness of breath.   Cardiovascular: Negative for cp or palpitations    Gastrointestinal: Negative for nausea, diarrhea and constipation.  Genitourinary: Negative for urgency and frequency.  Skin: Negative for pallor or rash   Neurological: Negative for weakness, light-headedness, numbness and headaches.  Hematological: Negative for adenopathy. Does not bruise/bleed easily.  Psychiatric/Behavioral: Negative for dysphoric mood. The patient is not nervous/anxious.         Objective:   Physical Exam  Constitutional: She appears well-developed and well-nourished. No distress.  HENT:  Head: Normocephalic and atraumatic.  Mouth/Throat: Oropharynx is clear and moist.  Eyes: Conjunctivae normal and EOM are normal. Pupils are equal, round, and reactive to light. Right eye exhibits no discharge. Left eye exhibits no discharge.  Neck: Normal range of motion. Neck supple. No JVD present. Carotid bruit is not present. No thyromegaly present.  Cardiovascular: Normal rate, regular rhythm and intact distal pulses.  Exam reveals no gallop.   Pulmonary/Chest: Effort normal and breath sounds normal. No respiratory distress. She has no wheezes.  Abdominal: Soft. Bowel sounds are normal. She exhibits no distension, no abdominal bruit and no mass. There is no tenderness.  Musculoskeletal: She exhibits no edema.  Lymphadenopathy:    She has no cervical adenopathy.  Neurological: She is alert. She has normal reflexes.  Skin: Skin is warm  and dry. No rash noted. No erythema. No pallor.  Psychiatric: She has a normal mood and affect.          Assessment & Plan:

## 2013-01-31 ENCOUNTER — Encounter: Payer: Self-pay | Admitting: Family Medicine

## 2013-01-31 ENCOUNTER — Ambulatory Visit (INDEPENDENT_AMBULATORY_CARE_PROVIDER_SITE_OTHER)
Admission: RE | Admit: 2013-01-31 | Discharge: 2013-01-31 | Disposition: A | Discharge status: Home / Self Care | Payer: Medicare Other | Source: Ambulatory Visit | Attending: Family Medicine | Admitting: Family Medicine

## 2013-01-31 ENCOUNTER — Ambulatory Visit (INDEPENDENT_AMBULATORY_CARE_PROVIDER_SITE_OTHER): Payer: Medicare Other | Admitting: Family Medicine

## 2013-01-31 VITALS — BP 130/84 | HR 81 | Temp 98.6°F | Ht 63.0 in | Wt 146.0 lb

## 2013-01-31 DIAGNOSIS — M25551 Pain in right hip: Secondary | ICD-10-CM | POA: Insufficient documentation

## 2013-01-31 DIAGNOSIS — M25559 Pain in unspecified hip: Secondary | ICD-10-CM

## 2013-01-31 NOTE — Assessment & Plan Note (Signed)
Suspect trochanteric bursitis from overuse/ working in the garden Recommend ice packs  Also relative rest from aggrivating activities Aleve if it helps Hip film today May need injection

## 2013-01-31 NOTE — Patient Instructions (Addendum)
xay of hip today I wonder if you have some bursitis  Use ice instead of heat- 10 minutes at a time  Use aleve with food as needed if it helps  Will make a plan based on xray reading

## 2013-01-31 NOTE — Progress Notes (Signed)
Subjective:    Patient ID: [name], female    DOB: [birth year], 72 y.o.   MRN: [id number]  HPI Here for R hip and leg pain -- (is improved today however) Past 3 weeks - pain in her R hip area  When she stood up it would "pinch" and "catch" - hurt to straighten leg  Then started to do the same thing in the groin area  Now - dull ache in buttock/ hip/side of leg  It hurts to lie on that side   Turning over in bed is painful Also after walking a while (by late afternoon) it starts to bother her   No numbness or tingling  No loss of strength   Has taken advil and aleve-not a lot of help  Patient Active Problem List  Diagnosis  . HYPERLIPIDEMIA  . CONSTIPATION, CHRONIC  . BACK PAIN  . OSTEOPENIA  . HYPERGLYCEMIA  . Hypertension  . Post-menopausal   Past Medical History  Diagnosis Date  . Osteopenia   . Back pain   . Hyperlipidemia   . Hypertension   . Chronic constipation    Past Surgical History  Procedure Laterality Date  . Cleft palate repair    . Tubal ligation    . Nephrectomy living donor      Left  . Foot neuroma surgery  2/06   History  Substance Use Topics  . Smoking status: Never Smoker   . Smokeless tobacco: Not on file     Comment: smoked 1 year after high school, never since  . Alcohol Use: No   Family History  Problem Relation Age of Onset  . Colon cancer Mother   . Diabetes Mother   . Coronary artery disease Mother   . Osteoporosis Mother   . Dementia Mother   . Coronary artery disease Father   . Heart failure Father   . Hypertension Father    Allergies  Allergen Reactions  . Codeine    Current Outpatient Prescriptions on File Prior to Visit  Medication Sig Dispense Refill  . aspirin 81 MG tablet Take 81 mg by mouth daily.      Marland Kitchen atorvastatin (LIPITOR) 10 MG tablet Take 1 tablet (10 mg total) by mouth daily.  90 tablet  3  . bisacodyl (DULCOLAX) 5 MG EC tablet Take 5 mg by mouth daily as needed.        . Calcium Carbonate  (CALCIUM 500 PO) Take 500 mg by mouth daily.        . Cholecalciferol (D 1000) 1000 UNITS tablet Take 1,000 Units by mouth daily.        Marland Kitchen lisinopril (PRINIVIL,ZESTRIL) 10 MG tablet Take 1 tablet (10 mg total) by mouth daily.  90 tablet  3  . Multiple Vitamins-Minerals (CENTRUM SILVER) tablet Take 1 tablet by mouth daily.        . Omega-3 Fatty Acids (FISH OIL PO) Take 1 capsule by mouth daily.         No current facility-administered medications on file prior to visit.    Review of Systems Review of Systems  Constitutional: Negative for fever, appetite change, fatigue and unexpected weight change.  Eyes: Negative for pain and visual disturbance.  Respiratory: Negative for cough and shortness of breath.   Cardiovascular: Negative for cp or palpitations    Gastrointestinal: Negative for nausea, diarrhea and constipation.  Genitourinary: Negative for urgency and frequency.  Skin: Negative for pallor or rash   MSK pos for hip/ leg/buttock  aching / neg for loss of strength Neurological: Negative for weakness, light-headedness, numbness and headaches.  Hematological: Negative for adenopathy. Does not bruise/bleed easily.  Psychiatric/Behavioral: Negative for dysphoric mood. The patient is not nervous/anxious.         Objective:   Physical Exam  Constitutional: She appears well-developed and well-nourished. No distress.  HENT:  Head: Normocephalic and atraumatic.  Eyes: Conjunctivae and EOM are normal. Pupils are equal, round, and reactive to light. No scleral icterus.  Neck: Normal range of motion. Neck supple.  Cardiovascular: Normal rate and regular rhythm.   Pulmonary/Chest: Effort normal and breath sounds normal.  Musculoskeletal: She exhibits tenderness. She exhibits no edema.       Right hip: She exhibits tenderness and bony tenderness. She exhibits normal range of motion, normal strength, no swelling, no crepitus and no deformity.       Lumbar back: She exhibits tenderness. She  exhibits normal range of motion, no bony tenderness, no swelling, no edema and no deformity.  Some R buttock muscle tenderness (SI area) No spinal tenderness Nl SLR R hip -pain on full internal rotation Tender over greater trochanter No swelling or skin changes  Nl gait  Nl rom LS   Neurological: She is alert. She has normal reflexes. She displays no atrophy. No sensory deficit. She exhibits normal muscle tone. Gait normal.  Skin: Skin is warm and dry. No rash noted. No erythema.  Psychiatric: She has a normal mood and affect.          Assessment & Plan:

## 2013-05-02 ENCOUNTER — Ambulatory Visit (INDEPENDENT_AMBULATORY_CARE_PROVIDER_SITE_OTHER): Payer: Medicare Other | Admitting: Family Medicine

## 2013-05-02 ENCOUNTER — Ambulatory Visit (INDEPENDENT_AMBULATORY_CARE_PROVIDER_SITE_OTHER)
Admission: RE | Admit: 2013-05-02 | Discharge: 2013-05-02 | Disposition: A | Discharge status: Home / Self Care | Payer: Medicare Other | Source: Ambulatory Visit | Attending: Family Medicine | Admitting: Family Medicine

## 2013-05-02 ENCOUNTER — Encounter: Payer: Self-pay | Admitting: Family Medicine

## 2013-05-02 VITALS — BP 130/86 | HR 83 | Temp 99.1°F | Ht 63.0 in | Wt 145.5 lb

## 2013-05-02 DIAGNOSIS — M25559 Pain in unspecified hip: Secondary | ICD-10-CM

## 2013-05-02 DIAGNOSIS — M25551 Pain in right hip: Secondary | ICD-10-CM

## 2013-05-02 DIAGNOSIS — R7309 Other abnormal glucose: Secondary | ICD-10-CM

## 2013-05-02 DIAGNOSIS — I10 Essential (primary) hypertension: Secondary | ICD-10-CM

## 2013-05-02 LAB — HEMOGLOBIN A1C: Hgb A1c MFr Bld: 6.2 % (ref 4.6–6.5)

## 2013-05-02 NOTE — Assessment & Plan Note (Signed)
Last xray showed a leucent lesion in femoral head that needs to be assessed for stability

## 2013-05-02 NOTE — Patient Instructions (Addendum)
Xray of hip today  Lab for A1c (sugar) today Keep up good diet and try to fit in a regular exercise program  Follow up in 6 mo with labs prior for annual exam

## 2013-05-02 NOTE — Progress Notes (Signed)
Subjective:    Patient ID: [name], female    DOB: Jul 03, 1941, 72 y.o.   MRN: [id number]  HPI Here for f/u of chronic health problems  Is feeling good overall -- having a good summer   bp is stable today  No cp or palpitations or headaches or edema  No side effects to medicines  BP Readings from Last 3 Encounters:  05/02/13 130/86  01/31/13 130/84  11/01/12 132/80    This is stable  On lisinopril Wt is stable with bmi of 25  Habits Stays active - working in the garden and some walking (not every day)  Wants to start getting up to walk  Is eating fairly well - eating lots of vegetables - doing well with that   Hyperglycemia Lab Results  Component Value Date   HGBA1C 6.1 09/20/2012       Chemistry      Component Value Date/Time   NA 137 09/20/2012 0930   K 4.5 09/20/2012 0930   CL 101 09/20/2012 0930   CO2 29 09/20/2012 0930   BUN 20 09/20/2012 0930   CREATININE 1.0 09/20/2012 0930      Component Value Date/Time   CALCIUM 9.6 09/20/2012 0930   ALKPHOS 94 09/20/2012 0930   AST 31 09/20/2012 0930   ALT 22 09/20/2012 0930   BILITOT 0.4 09/20/2012 0930      Also due for f/u hip xray and wants to do that today- leucent lesion in fem head seen last time  Patient Active Problem List   Diagnosis Date Noted  . Right hip pain 01/31/2013  . Hypertension 01/27/2011  . Post-menopausal 01/27/2011  . BACK PAIN 03/13/2008  . HYPERLIPIDEMIA 12/20/2007  . CONSTIPATION, CHRONIC 12/20/2007  . OSTEOPENIA 12/20/2007  . HYPERGLYCEMIA 12/20/2007   Past Medical History  Diagnosis Date  . Osteopenia   . Back pain   . Hyperlipidemia   . Hypertension   . Chronic constipation    Past Surgical History  Procedure Laterality Date  . Cleft palate repair    . Tubal ligation    . Nephrectomy living donor      Left  . Foot neuroma surgery  2/06   History  Substance Use Topics  . Smoking status: Never Smoker   . Smokeless tobacco: Not on file     Comment: smoked 1  year after high school, never since  . Alcohol Use: No   Family History  Problem Relation Age of Onset  . Colon cancer Mother   . Diabetes Mother   . Coronary artery disease Mother   . Osteoporosis Mother   . Dementia Mother   . Coronary artery disease Father   . Heart failure Father   . Hypertension Father    Allergies  Allergen Reactions  . Codeine    Current Outpatient Prescriptions on File Prior to Visit  Medication Sig Dispense Refill  . aspirin 81 MG tablet Take 81 mg by mouth daily.      Marland Kitchen atorvastatin (LIPITOR) 10 MG tablet Take 1 tablet (10 mg total) by mouth daily.  90 tablet  3  . bisacodyl (DULCOLAX) 5 MG EC tablet Take 5 mg by mouth daily as needed.        . Calcium Carbonate (CALCIUM 500 PO) Take 500 mg by mouth daily.        . Cholecalciferol (D 1000) 1000 UNITS tablet Take 1,000 Units by mouth daily.        Marland Kitchen lisinopril (PRINIVIL,ZESTRIL) 10 MG  tablet Take 1 tablet (10 mg total) by mouth daily.  90 tablet  3  . Multiple Vitamins-Minerals (CENTRUM SILVER) tablet Take 1 tablet by mouth daily.        . Omega-3 Fatty Acids (FISH OIL PO) Take 1 capsule by mouth daily.         No current facility-administered medications on file prior to visit.     Review of Systems Review of Systems  Constitutional: Negative for fever, appetite change, fatigue and unexpected weight change.  Eyes: Negative for pain and visual disturbance.  Respiratory: Negative for cough and shortness of breath.   Cardiovascular: Negative for cp or palpitations    Gastrointestinal: Negative for nausea, diarrhea and constipation.  Genitourinary: Negative for urgency and frequency.  Skin: Negative for pallor or rash   MSK pos for hip pain that has improved Neurological: Negative for weakness, light-headedness, numbness and headaches.  Hematological: Negative for adenopathy. Does not bruise/bleed easily.  Psychiatric/Behavioral: Negative for dysphoric mood. The patient is not nervous/anxious.           Objective:   Physical Exam  Constitutional: She appears well-developed and well-nourished. No distress.  HENT:  Head: Normocephalic and atraumatic.  Mouth/Throat: Oropharynx is clear and moist.  Eyes: Conjunctivae and EOM are normal. Pupils are equal, round, and reactive to light. No scleral icterus.  Neck: Normal range of motion. Neck supple. Carotid bruit is not present. No thyromegaly present.  Cardiovascular: Normal rate, regular rhythm, normal heart sounds and intact distal pulses.   Pulmonary/Chest: Effort normal and breath sounds normal. No respiratory distress. She has no wheezes.  Abdominal: Soft. Bowel sounds are normal.  Musculoskeletal: She exhibits no edema and no tenderness.  Lymphadenopathy:    She has no cervical adenopathy.  Neurological: She is alert. She has normal reflexes. No cranial nerve deficit. She exhibits normal muscle tone. Coordination normal.  Skin: Skin is warm and dry. No rash noted.  Psychiatric: She has a normal mood and affect.          Assessment & Plan:

## 2013-05-04 NOTE — Assessment & Plan Note (Signed)
A1C today Rev need for low glycemic diet and exercise

## 2013-05-04 NOTE — Assessment & Plan Note (Signed)
bp in fair control at this time  No changes needed  Disc lifstyle change with low sodium diet and exercise  Enc good habits Lab today

## 2013-05-05 ENCOUNTER — Encounter: Payer: Self-pay | Admitting: *Deleted

## 2013-06-13 ENCOUNTER — Other Ambulatory Visit: Payer: Self-pay | Admitting: *Deleted

## 2013-06-13 MED ORDER — ATORVASTATIN CALCIUM 10 MG PO TABS: 10.0000 mg | ORAL_TABLET | Freq: Every day | ORAL | Status: DC

## 2013-07-16 ENCOUNTER — Telehealth (INDEPENDENT_AMBULATORY_CARE_PROVIDER_SITE_OTHER): Payer: Self-pay

## 2013-07-16 NOTE — Telephone Encounter (Deleted)
error 

## 2013-11-06 ENCOUNTER — Telehealth: Payer: Self-pay | Admitting: Family Medicine

## 2013-11-06 DIAGNOSIS — M899 Disorder of bone, unspecified: Secondary | ICD-10-CM

## 2013-11-06 DIAGNOSIS — M949 Disorder of cartilage, unspecified: Secondary | ICD-10-CM

## 2013-11-06 DIAGNOSIS — I10 Essential (primary) hypertension: Secondary | ICD-10-CM

## 2013-11-06 DIAGNOSIS — E785 Hyperlipidemia, unspecified: Secondary | ICD-10-CM

## 2013-11-06 DIAGNOSIS — R7309 Other abnormal glucose: Secondary | ICD-10-CM

## 2013-11-06 NOTE — Telephone Encounter (Signed)
Message copied by Abner Greenspan on Thu Nov 06, 2013  6:39 PM ------      Message from: Ellamae Sia      Created: Mon Oct 27, 2013  5:24 PM      Regarding: Lab orders for Friday, 1.30.15       Patient is scheduled for CPX labs, please order future labs, Thanks , Terri       ------

## 2013-11-07 ENCOUNTER — Other Ambulatory Visit (INDEPENDENT_AMBULATORY_CARE_PROVIDER_SITE_OTHER): Payer: Medicare HMO

## 2013-11-07 DIAGNOSIS — M899 Disorder of bone, unspecified: Secondary | ICD-10-CM

## 2013-11-07 DIAGNOSIS — M949 Disorder of cartilage, unspecified: Secondary | ICD-10-CM

## 2013-11-07 DIAGNOSIS — E785 Hyperlipidemia, unspecified: Secondary | ICD-10-CM

## 2013-11-07 DIAGNOSIS — I10 Essential (primary) hypertension: Secondary | ICD-10-CM

## 2013-11-07 DIAGNOSIS — R7309 Other abnormal glucose: Secondary | ICD-10-CM

## 2013-11-07 LAB — TSH: TSH: 2.83 u[IU]/mL (ref 0.35–5.50)

## 2013-11-07 LAB — COMPREHENSIVE METABOLIC PANEL
ALT: 22 U/L (ref 0–35)
AST: 27 U/L (ref 0–37)
Albumin: 3.9 g/dL (ref 3.5–5.2)
Alkaline Phosphatase: 75 U/L (ref 39–117)
BUN: 16 mg/dL (ref 6–23)
CO2: 30 mEq/L (ref 19–32)
Calcium: 9.4 mg/dL (ref 8.4–10.5)
Chloride: 106 mEq/L (ref 96–112)
Creatinine, Ser: 1.1 mg/dL (ref 0.4–1.2)
GFR: 50.41 mL/min — ABNORMAL LOW (ref >60.00)
Glucose, Bld: 106 mg/dL — ABNORMAL HIGH (ref 70–99)
Potassium: 4.9 mEq/L (ref 3.5–5.1)
Sodium: 140 mEq/L (ref 135–145)
Total Bilirubin: 0.7 mg/dL (ref 0.3–1.2)
Total Protein: 6.9 g/dL (ref 6.0–8.3)

## 2013-11-07 LAB — CBC WITH DIFFERENTIAL/PLATELET
Basophils Absolute: 0 10*3/uL (ref 0.0–0.1)
Basophils Relative: 0.3 % (ref 0.0–3.0)
Eosinophils Absolute: 0.2 10*3/uL (ref 0.0–0.7)
Eosinophils Relative: 3.2 % (ref 0.0–5.0)
HCT: 39.1 % (ref 36.0–46.0)
Hemoglobin: 12.9 g/dL (ref 12.0–15.0)
Lymphocytes Relative: 22.9 % (ref 12.0–46.0)
Lymphs Abs: 1.4 10*3/uL (ref 0.7–4.0)
MCHC: 33 g/dL (ref 30.0–36.0)
MCV: 93.9 fl (ref 78.0–100.0)
Monocytes Absolute: 0.3 10*3/uL (ref 0.1–1.0)
Monocytes Relative: 4.6 % (ref 3.0–12.0)
Neutro Abs: 4.2 10*3/uL (ref 1.4–7.7)
Neutrophils Relative %: 69 % (ref 43.0–77.0)
Platelets: 215 10*3/uL (ref 150.0–400.0)
RBC: 4.16 Mil/uL (ref 3.87–5.11)
RDW: 12.9 % (ref 11.5–14.6)
WBC: 6.1 10*3/uL (ref 4.5–10.5)

## 2013-11-07 LAB — LIPID PANEL
Cholesterol: 156 mg/dL (ref 0–200)
HDL: 42.8 mg/dL (ref >39.00)
LDL Cholesterol: 81 mg/dL (ref 0–99)
Total CHOL/HDL Ratio: 4
Triglycerides: 161 mg/dL — ABNORMAL HIGH (ref 0.0–149.0)
VLDL: 32.2 mg/dL (ref 0.0–40.0)

## 2013-11-07 LAB — HEMOGLOBIN A1C: Hgb A1c MFr Bld: 5.9 % (ref 4.6–6.5)

## 2013-11-08 LAB — VITAMIN D 25 HYDROXY (VIT D DEFICIENCY, FRACTURES): Vit D, 25-Hydroxy: 65 ng/mL (ref 30–89)

## 2013-11-14 ENCOUNTER — Encounter: Payer: Self-pay | Admitting: Family Medicine

## 2013-11-14 ENCOUNTER — Ambulatory Visit (INDEPENDENT_AMBULATORY_CARE_PROVIDER_SITE_OTHER): Payer: Medicare HMO | Admitting: Family Medicine

## 2013-11-14 VITALS — BP 122/68 | HR 85 | Temp 98.9°F | Ht 62.5 in | Wt 146.8 lb

## 2013-11-14 DIAGNOSIS — L309 Dermatitis, unspecified: Secondary | ICD-10-CM | POA: Insufficient documentation

## 2013-11-14 DIAGNOSIS — M949 Disorder of cartilage, unspecified: Secondary | ICD-10-CM

## 2013-11-14 DIAGNOSIS — L259 Unspecified contact dermatitis, unspecified cause: Secondary | ICD-10-CM

## 2013-11-14 DIAGNOSIS — Z1211 Encounter for screening for malignant neoplasm of colon: Secondary | ICD-10-CM | POA: Insufficient documentation

## 2013-11-14 DIAGNOSIS — Z23 Encounter for immunization: Secondary | ICD-10-CM

## 2013-11-14 DIAGNOSIS — Z800 Family history of malignant neoplasm of digestive organs: Secondary | ICD-10-CM | POA: Insufficient documentation

## 2013-11-14 DIAGNOSIS — M899 Disorder of bone, unspecified: Secondary | ICD-10-CM

## 2013-11-14 DIAGNOSIS — R7309 Other abnormal glucose: Secondary | ICD-10-CM

## 2013-11-14 DIAGNOSIS — Z0000 Encounter for general adult medical examination without abnormal findings: Secondary | ICD-10-CM | POA: Insufficient documentation

## 2013-11-14 DIAGNOSIS — E785 Hyperlipidemia, unspecified: Secondary | ICD-10-CM

## 2013-11-14 DIAGNOSIS — I10 Essential (primary) hypertension: Secondary | ICD-10-CM

## 2013-11-14 MED ORDER — LISINOPRIL 10 MG PO TABS: 10.0000 mg | ORAL_TABLET | Freq: Every day | ORAL | Status: DC

## 2013-11-14 MED ORDER — DESONIDE 0.05 % EX CREA: TOPICAL_CREAM | Freq: Two times a day (BID) | CUTANEOUS | Status: DC

## 2013-11-14 MED ORDER — ATORVASTATIN CALCIUM 10 MG PO TABS: 10.0000 mg | ORAL_TABLET | Freq: Every day | ORAL | Status: DC

## 2013-11-14 NOTE — Progress Notes (Signed)
Subjective:    Patient ID: [name], female    DOB: 08/06/1941, 73 y.o.   MRN: [id number]  HPI I have personally reviewed the Medicare Annual Wellness questionnaire and have noted 1. The patient's medical and social history 2. Their use of alcohol, tobacco or illicit drugs 3. Their current medications and supplements 4. The patient's functional ability including ADL's, fall risks, home safety risks and hearing or visual             impairment. 5. Diet and physical activities 6. Evidence for depression or mood disorders  The patients weight, height, BMI have been recorded in the chart and visual acuity is per eye clinic.  I have made referrals, counseling and provided education to the patient based review of the above and I have provided the pt with a written personalized care plan for preventive services.  Has been doing well overall   Has some skin problems on her face  Sees derm Marcine Matar -needs to get appt  Most of her irritation was on eyelids at first -now in other areas  Has desonide .05%  ? What her dx is  Does not wear nail polish on fingers / occ toes -not often  She uses Derald Macleod products - for years  Now uses some vaseline on rough areas  She does get little bumps in scalp occas   See scanned forms.  Routine anticipatory guidance given to patient.  See health maintenance. Flu shot -in the fall  Shingles shot 2/13 PNA declines in the past -is open to one today Tetanus 4/12 vaccine  Colonoscopy 3/08 - family hx of colon cancer - needs to set up her 5 year Breast cancer screening mammogram - has not had one since 2012 . She will make her own appt Self breast exam no lumps  Gyn -no problems or abn paps and no new partners  Advance directive- does not have a living will  Cognitive function addressed- see scanned forms- and if abnormal then additional documentation follows. -no major memory concerns   PMH and SH reviewed  Meds, vitals, and allergies  reviewed.   ROS: See HPI.  Otherwise negative.     dexa 5/12- due for f/u  Osteopenia  No fx  Takes ca and D D level is 65   bp is stable today  No cp or palpitations or headaches or edema  No side effects to medicines  BP Readings from Last 3 Encounters:  11/14/13 122/68  05/02/13 130/86  01/31/13 130/84     Results for orders placed in visit on 11/07/13  COMPREHENSIVE METABOLIC PANEL      Result Value Range   Sodium 140  135 - 145 mEq/L   Potassium 4.9  3.5 - 5.1 mEq/L   Chloride 106  96 - 112 mEq/L   CO2 30  19 - 32 mEq/L   Glucose, Bld 106 (*) 70 - 99 mg/dL   BUN 16  6 - 23 mg/dL   Creatinine, Ser 1.1  0.4 - 1.2 mg/dL   Total Bilirubin 0.7  0.3 - 1.2 mg/dL   Alkaline Phosphatase 75  39 - 117 U/L   AST 27  0 - 37 U/L   ALT 22  0 - 35 U/L   Total Protein 6.9  6.0 - 8.3 g/dL   Albumin 3.9  3.5 - 5.2 g/dL   Calcium 9.4  8.4 - 10.5 mg/dL   GFR 50.41 (*) >60.00 mL/min  HEMOGLOBIN A1C  Result Value Range   Hemoglobin A1C 5.9  4.6 - 6.5 %  LIPID PANEL      Result Value Range   Cholesterol 156  0 - 200 mg/dL   Triglycerides 161.0 (*) 0.0 - 149.0 mg/dL   HDL 42.80  >39.00 mg/dL   VLDL 32.2  0.0 - 40.0 mg/dL   LDL Cholesterol 81  0 - 99 mg/dL   Total CHOL/HDL Ratio 4    TSH      Result Value Range   TSH 2.83  0.35 - 5.50 uIU/mL  VITAMIN D 25 HYDROXY      Result Value Range   Vit D, 25-Hydroxy 65  30 - 89 ng/mL  CBC WITH DIFFERENTIAL      Result Value Range   WBC 6.1  4.5 - 10.5 K/uL   RBC 4.16  3.87 - 5.11 Mil/uL   Hemoglobin 12.9  12.0 - 15.0 g/dL   HCT 39.1  36.0 - 46.0 %   MCV 93.9  78.0 - 100.0 fl   MCHC 33.0  30.0 - 36.0 g/dL   RDW 12.9  11.5 - 14.6 %   Platelets 215.0  150.0 - 400.0 K/uL   Neutrophils Relative % 69.0  43.0 - 77.0 %   Lymphocytes Relative 22.9  12.0 - 46.0 %   Monocytes Relative 4.6  3.0 - 12.0 %   Eosinophils Relative 3.2  0.0 - 5.0 %   Basophils Relative 0.3  0.0 - 3.0 %   Neutro Abs 4.2  1.4 - 7.7 K/uL   Lymphs Abs 1.4  0.7 -  4.0 K/uL   Monocytes Absolute 0.3  0.1 - 1.0 K/uL   Eosinophils Absolute 0.2  0.0 - 0.7 K/uL   Basophils Absolute 0.0  0.0 - 0.1 K/uL      Patient Active Problem List   Diagnosis Date Noted  . Encounter for Medicare annual wellness exam 11/14/2013  . Dermatitis 11/14/2013  . Family history of colon cancer 11/14/2013  . Colon cancer screening 11/14/2013  . Right hip pain 01/31/2013  . Hypertension 01/27/2011  . Post-menopausal 01/27/2011  . BACK PAIN 03/13/2008  . HYPERLIPIDEMIA 12/20/2007  . CONSTIPATION, CHRONIC 12/20/2007  . OSTEOPENIA 12/20/2007  . HYPERGLYCEMIA 12/20/2007   Past Medical History  Diagnosis Date  . Osteopenia   . Back pain   . Hyperlipidemia   . Hypertension   . Chronic constipation    Past Surgical History  Procedure Laterality Date  . Cleft palate repair    . Tubal ligation    . Nephrectomy living donor      Left  . Foot neuroma surgery  2/06   History  Substance Use Topics  . Smoking status: Never Smoker   . Smokeless tobacco: Not on file     Comment: smoked 1 year after high school, never since  . Alcohol Use: No   Family History  Problem Relation Age of Onset  . Colon cancer Mother   . Diabetes Mother   . Coronary artery disease Mother   . Osteoporosis Mother   . Dementia Mother   . Coronary artery disease Father   . Heart failure Father   . Hypertension Father    Allergies  Allergen Reactions  . Codeine    Current Outpatient Prescriptions on File Prior to Visit  Medication Sig Dispense Refill  . aspirin 81 MG tablet Take 81 mg by mouth daily.      . bisacodyl (DULCOLAX) 5 MG EC tablet Take 5 mg by  mouth daily as needed.        . Cholecalciferol (D 1000) 1000 UNITS tablet Take 1,000 Units by mouth daily.        . Multiple Vitamins-Minerals (CENTRUM SILVER) tablet Take 1 tablet by mouth daily.        . Omega-3 Fatty Acids (FISH OIL PO) Take 1 capsule by mouth daily.         No current facility-administered medications on file  prior to visit.    Review of Systems Review of Systems  Constitutional: Negative for fever, appetite change, fatigue and unexpected weight change.  Eyes: Negative for pain and visual disturbance.  Respiratory: Negative for cough and shortness of breath.   Cardiovascular: Negative for cp or palpitations    Gastrointestinal: Negative for nausea, diarrhea and constipation.  Genitourinary: Negative for urgency and frequency.  Skin: Negative for pallor or rash   Neurological: Negative for weakness, light-headedness, numbness and headaches.  Hematological: Negative for adenopathy. Does not bruise/bleed easily.  Psychiatric/Behavioral: Negative for dysphoric mood. The patient is not nervous/anxious.          Objective:   Physical Exam  Constitutional: She appears well-developed and well-nourished. No distress.  HENT:  Head: Normocephalic and atraumatic.  Right Ear: External ear normal.  Left Ear: External ear normal.  Mouth/Throat: Oropharynx is clear and moist.  Eyes: Conjunctivae and EOM are normal. Pupils are equal, round, and reactive to light. No scleral icterus.  Neck: Normal range of motion. Neck supple. No JVD present. Carotid bruit is not present. No thyromegaly present.  Cardiovascular: Normal rate, regular rhythm, normal heart sounds and intact distal pulses.  Exam reveals no gallop.   Pulmonary/Chest: Effort normal and breath sounds normal. No respiratory distress. She has no wheezes. She exhibits no tenderness.  Abdominal: Soft. Bowel sounds are normal. She exhibits no distension, no abdominal bruit and no mass. There is no tenderness.  Genitourinary: No breast swelling, tenderness, discharge or bleeding.  Breast exam: No mass, nodules, thickening, tenderness, bulging, retraction, inflamation, nipple discharge or skin changes noted.  No axillary or clavicular LA.   Musculoskeletal: Normal range of motion. She exhibits no edema and no tenderness.  Lymphadenopathy:    She has  no cervical adenopathy.  Neurological: She is alert. She has normal reflexes. No cranial nerve deficit. She exhibits normal muscle tone. Coordination normal.  Skin: Skin is warm and dry. No rash noted. No erythema. No pallor.  Some dry flaky skin - eyelids and forehead and around mouth  Psychiatric: She has a normal mood and affect.          Assessment & Plan:

## 2013-11-14 NOTE — Progress Notes (Signed)
Pre-visit discussion using our clinic review tool. No additional management support is needed unless otherwise documented below in the visit note.  

## 2013-11-14 NOTE — Patient Instructions (Signed)
Get a flu shot at a pharmacy for the season  Call and get your dermatology appt  Pneumonia vaccine today  Stop up front for referrals for colonoscopy and bone density test Don't forget to make you mammogram appointment

## 2013-11-16 NOTE — Assessment & Plan Note (Signed)
Reviewed health habits including diet and exercise and skin cancer prevention Reviewed appropriate screening tests for age  Also reviewed health mt list, fam hx and immunization status , as well as social and family history   See HPI Pt will sched mammogram  Ref for dexa and colonosc Labs rev

## 2013-11-16 NOTE — Assessment & Plan Note (Signed)
Px desonide refill She will f/u with derm Diff incl eczema/atopic rxn vs perioral dermatitis (less likely due to eyelid inv) or seb derm

## 2013-11-16 NOTE — Assessment & Plan Note (Signed)
Ref for screening colonosc/given fam hx

## 2013-11-16 NOTE — Assessment & Plan Note (Signed)
Due for dexa  No fragility fractures Disc need for calcium/ vitamin D/ wt bearing exercise and bone density test every 2 y to monitor Disc safety/ fracture risk in detail

## 2013-11-16 NOTE — Assessment & Plan Note (Signed)
Disc goals for lipids and reasons to control them Rev labs with pt Rev low sat fat diet in detail  Will continue lipitor and diet  

## 2013-11-16 NOTE — Assessment & Plan Note (Signed)
Lab Results  Component Value Date   HGBA1C 5.9 11/07/2013   Rev low glycemic diet and healthy exercise to prevent dm

## 2013-11-16 NOTE — Assessment & Plan Note (Signed)
Ref for screening colonosc No symptoms

## 2013-11-16 NOTE — Assessment & Plan Note (Signed)
BP: 122/68 mmHg  bp in fair control at this time  No changes needed Disc lifstyle change with low sodium diet and exercise    Lab reviewed

## 2013-11-17 ENCOUNTER — Telehealth: Payer: Self-pay | Admitting: Family Medicine

## 2013-11-17 ENCOUNTER — Encounter: Payer: Self-pay | Admitting: Internal Medicine

## 2013-11-17 ENCOUNTER — Other Ambulatory Visit: Payer: Self-pay | Admitting: Family Medicine

## 2013-11-17 DIAGNOSIS — Z1231 Encounter for screening mammogram for malignant neoplasm of breast: Secondary | ICD-10-CM

## 2013-11-17 NOTE — Telephone Encounter (Signed)
Relevant patient education assigned to patient using Emmi. ° °

## 2013-12-05 ENCOUNTER — Ambulatory Visit
Admission: RE | Admit: 2013-12-05 | Discharge: 2013-12-05 | Disposition: A | Discharge status: Home / Self Care | Payer: Medicare HMO | Source: Ambulatory Visit | Attending: Family Medicine | Admitting: Family Medicine

## 2013-12-05 DIAGNOSIS — M949 Disorder of cartilage, unspecified: Principal | ICD-10-CM

## 2013-12-05 DIAGNOSIS — M899 Disorder of bone, unspecified: Secondary | ICD-10-CM

## 2013-12-05 DIAGNOSIS — Z1231 Encounter for screening mammogram for malignant neoplasm of breast: Secondary | ICD-10-CM

## 2013-12-05 LAB — HM DEXA SCAN

## 2013-12-09 ENCOUNTER — Encounter: Payer: Self-pay | Admitting: *Deleted

## 2013-12-09 ENCOUNTER — Encounter: Payer: Self-pay | Admitting: Family Medicine

## 2013-12-19 ENCOUNTER — Ambulatory Visit (AMBULATORY_SURGERY_CENTER): Payer: Self-pay | Admitting: *Deleted

## 2013-12-19 VITALS — Ht 63.0 in | Wt 147.0 lb

## 2013-12-19 DIAGNOSIS — Z800 Family history of malignant neoplasm of digestive organs: Secondary | ICD-10-CM

## 2013-12-19 MED ORDER — MOVIPREP 100 G PO SOLR: 1.0000 | Freq: Once | ORAL | Status: DC

## 2013-12-19 NOTE — Progress Notes (Signed)
No egg or soy allergy. No anesthesia problems.  

## 2013-12-24 ENCOUNTER — Encounter: Payer: Self-pay | Admitting: Internal Medicine

## 2014-01-02 ENCOUNTER — Ambulatory Visit (AMBULATORY_SURGERY_CENTER): Payer: Medicare HMO | Admitting: Internal Medicine

## 2014-01-02 ENCOUNTER — Encounter: Payer: Self-pay | Admitting: Internal Medicine

## 2014-01-02 VITALS — BP 113/63 | HR 66 | Temp 97.0°F | Resp 23 | Ht 63.0 in | Wt 147.0 lb

## 2014-01-02 DIAGNOSIS — Z800 Family history of malignant neoplasm of digestive organs: Secondary | ICD-10-CM

## 2014-01-02 DIAGNOSIS — Z1211 Encounter for screening for malignant neoplasm of colon: Secondary | ICD-10-CM

## 2014-01-02 MED ORDER — SODIUM CHLORIDE 0.9 % IV SOLN: 500.0000 mL | INTRAVENOUS | Status: DC

## 2014-01-02 NOTE — Op Note (Signed)
[place]  [address]. [place], [address]   COLONOSCOPY PROCEDURE REPORT  PATIENT: [name], [name]  MR#: [id number] BIRTHDATE: 1940/10/25 , 72  yrs. old GENDER: Female ENDOSCOPIST: Eustace Quail, MD REFERRED [id number] Recall, M.D. PROCEDURE DATE:  01/02/2014 PROCEDURE:   Colonoscopy, screening First Screening Colonoscopy - Avg.  risk and is 50 yrs.  old or older - No.  Prior Negative Screening - Now for repeat screening. N/A  History of Adenoma - Now for follow-up colonoscopy & has been > or = to 3 yrs.  N/A  Polyps Removed Today? No.  Recommend repeat exam, <10 yrs? No. ASA CLASS:   Class II INDICATIONS:Patient's immediate family history of colon cancer. Parents  at 9. Prior examinations in 1998, 2008, and 2003 all negative (Dr. Sharlett Iles ). MEDICATIONS: MAC sedation, administered by CRNA and propofol (Diprivan) 440mg  IV  DESCRIPTION OF PROCEDURE:   After the risks benefits and alternatives of the procedure were thoroughly explained, informed consent was obtained.  A digital rectal exam revealed no abnormalities of the rectum.   The LB [id number] and LB [id number]  endoscope was introduced through the anus and advanced to the cecum, which was identified by both the appendix and ileocecal valve. No adverse events experienced.   The quality of the prep was excellent, using MoviPrep  The instrument was then slowly withdrawn as the colon was fully examined.      COLON FINDINGS: A normal appearing cecum, ileocecal valve, and appendiceal orifice were identified.  The ascending, hepatic flexure, transverse, splenic flexure, descending, sigmoid colon and rectum appeared unremarkable.  No polyps or cancers were seen. Retroflexed views revealed no abnormalities. The time to cecum=5 minutes 03 seconds.  Withdrawal time=10 minutes 0 seconds.  The scope was withdrawn and the procedure completed.  COMPLICATIONS: There were no  complications.  ENDOSCOPIC IMPRESSION: 1. Normal colon  RECOMMENDATIONS: 1. Return to the care of your primary provider.  GI follow up as needed   eSigned:  Eustace Quail, MD 01/02/2014 11:40 AM   cc: Abner Greenspan, MD and The Patient

## 2014-01-02 NOTE — Progress Notes (Signed)
Report to pacu rn, vss, bbs=clear 

## 2014-01-02 NOTE — Patient Instructions (Signed)
YOU HAD AN ENDOSCOPIC PROCEDURE TODAY AT THE [facility]: Refer to the procedure report that was given to you for any specific questions about what was found during the examination.  If the procedure report does not answer your questions, please call your gastroenterologist to clarify.  If you requested that your care partner not be given the details of your procedure findings, then the procedure report has been included in a sealed envelope for you to review at your convenience later.  YOU SHOULD EXPECT: Some feelings of bloating in the abdomen. Passage of more gas than usual.  Walking can help get rid of the air that was put into your GI tract during the procedure and reduce the bloating. If you had a lower endoscopy (such as a colonoscopy or flexible sigmoidoscopy) you may notice spotting of blood in your stool or on the toilet paper. If you underwent a bowel prep for your procedure, then you may not have a normal bowel movement for a few days.  DIET: Your first meal following the procedure should be a light meal and then it is ok to progress to your normal diet.  A half-sandwich or bowl of soup is an example of a good first meal.  Heavy or fried foods are harder to digest and may make you feel nauseous or bloated.  Likewise meals heavy in dairy and vegetables can cause extra gas to form and this can also increase the bloating.  Drink plenty of fluids but you should avoid alcoholic beverages for 24 hours.  ACTIVITY: Your care partner should take you home directly after the procedure.  You should plan to take it easy, moving slowly for the rest of the day.  You can resume normal activity the day after the procedure however you should NOT DRIVE or use heavy machinery for 24 hours (because of the sedation medicines used during the test).    SYMPTOMS TO REPORT IMMEDIATELY: A gastroenterologist can be reached at any hour.  During normal business hours, 8:30 AM to 5:00 PM Monday through Friday,  call [phone].  After hours and on weekends, please call the GI answering service at [phone] who will take a message and have the physician on call contact you.   Following lower endoscopy (colonoscopy or flexible sigmoidoscopy):  Excessive amounts of blood in the stool  Significant tenderness or worsening of abdominal pains  Swelling of the abdomen that is new, acute  Fever of 100F or higher    FOLLOW UP: If any biopsies were taken you will be contacted by phone or by letter within the next 1-3 weeks.  Call your gastroenterologist if you have not heard about the biopsies in 3 weeks.  Our staff will call the home number listed on your records the next business day following your procedure to check on you and address any questions or concerns that you may have at that time regarding the information given to you following your procedure. This is a courtesy call and so if there is no answer at the home number and we have not heard from you through the emergency physician on call, we will assume that you have returned to your regular daily activities without incident.  SIGNATURES/CONFIDENTIALITY: You and/or your care partner have signed paperwork which will be entered into your electronic medical record.  These signatures attest to the fact that that the information above on your After Visit Summary has been reviewed and is understood.  Full responsibility of the confidentiality   of this discharge information lies with you and/or your care-partner.     

## 2014-01-05 ENCOUNTER — Telehealth: Payer: Self-pay

## 2014-01-05 NOTE — Telephone Encounter (Signed)
  Follow up Call-  Call back number 01/02/2014  Post procedure Call Back phone  # [phone]  Permission to leave phone message Yes     Patient questions:  Do you have a fever, pain , or abdominal swelling? no Pain Score  0 *  Have you tolerated food without any problems? yes  Have you been able to return to your normal activities? yes  Do you have any questions about your discharge instructions: Diet   no Medications  no Follow up visit  no  Do you have questions or concerns about your Care? no  Actions: * If pain score is 4 or above: No action needed, pain <4.

## 2014-06-25 ENCOUNTER — Telehealth: Payer: Self-pay

## 2014-06-25 NOTE — Telephone Encounter (Signed)
Pt left v/m; pt said she has to clear her throat all the time and pt heard that was a side effect of Atorvastatin; pt wants to switch back to Lipitor or try a different statin. [facility].pt request cb.

## 2014-06-25 NOTE — Telephone Encounter (Signed)
I have not noted that problem in the past.  Please have her call her ins to see which statins are covered including price of brand name lipitor  Thanks

## 2014-06-26 NOTE — Telephone Encounter (Signed)
Pt.notified

## 2014-07-27 ENCOUNTER — Ambulatory Visit (INDEPENDENT_AMBULATORY_CARE_PROVIDER_SITE_OTHER): Payer: Medicare HMO | Admitting: Family Medicine

## 2014-07-27 ENCOUNTER — Encounter: Payer: Self-pay | Admitting: Family Medicine

## 2014-07-27 VITALS — BP 146/94 | HR 84 | Temp 98.6°F | Ht 62.5 in | Wt 150.0 lb

## 2014-07-27 DIAGNOSIS — R238 Other skin changes: Secondary | ICD-10-CM

## 2014-07-27 DIAGNOSIS — L989 Disorder of the skin and subcutaneous tissue, unspecified: Secondary | ICD-10-CM

## 2014-07-27 NOTE — Progress Notes (Signed)
Subjective:    Patient ID: [name], female    DOB: 12/07/1940, 73 y.o.   MRN: [id number]  HPI Here with a spot on her face   She has dermatitis on her face baseline- uses desonide occ  Sat-the R side of her face was red and warm and splotchy and swollen The same on Sunday  Wondered about cellulitis  She herself did not have a fever  She called nurse line - and was adv to go to the ER (too late for UC)- she refused Then she went home and took ibuprofen and benadryl   Woke up today and it was better   Feels fine today    [place]- ? Exposure   Patient Active Problem List   Diagnosis Date Noted  . Encounter for Medicare annual wellness exam 11/14/2013  . Dermatitis 11/14/2013  . Family history of colon cancer 11/14/2013  . Colon cancer screening 11/14/2013  . Right hip pain 01/31/2013  . Hypertension 01/27/2011  . Post-menopausal 01/27/2011  . BACK PAIN 03/13/2008  . HYPERLIPIDEMIA 12/20/2007  . CONSTIPATION, CHRONIC 12/20/2007  . OSTEOPENIA 12/20/2007  . HYPERGLYCEMIA 12/20/2007   Past Medical History  Diagnosis Date  . Osteopenia   . Back pain   . Hyperlipidemia   . Hypertension   . Chronic constipation    Past Surgical History  Procedure Laterality Date  . Cleft palate repair    . Tubal ligation    . Nephrectomy living donor      Left  . Foot neuroma surgery  2/06   History  Substance Use Topics  . Smoking status: Never Smoker   . Smokeless tobacco: Never Used     Comment: smoked 1 year after high school, never since  . Alcohol Use: No   Family History  Problem Relation Age of Onset  . Colon cancer Mother   . Diabetes Mother   . Coronary artery disease Mother   . Osteoporosis Mother   . Dementia Mother   . Coronary artery disease Father   . Heart failure Father   . Hypertension Father   . Stomach cancer Neg Hx    Allergies  Allergen Reactions  . Codeine    Current Outpatient Prescriptions on File Prior to Visit    Medication Sig Dispense Refill  . aspirin 81 MG tablet Take 81 mg by mouth daily.      Marland Kitchen atorvastatin (LIPITOR) 10 MG tablet Take 1 tablet (10 mg total) by mouth daily.  90 tablet  3  . bisacodyl (DULCOLAX) 5 MG EC tablet Take 5 mg by mouth daily as needed.        . Cholecalciferol (D 1000) 1000 UNITS tablet Take 1,000 Units by mouth daily.        Marland Kitchen desonide (DESOWEN) 0.05 % cream Apply topically 2 (two) times daily. To affected areas  15 g  1  . lisinopril (PRINIVIL,ZESTRIL) 10 MG tablet Take 1 tablet (10 mg total) by mouth daily.  90 tablet  3  . Multiple Vitamins-Minerals (CENTRUM SILVER) tablet Take 1 tablet by mouth daily.        . Omega-3 Fatty Acids (FISH OIL PO) Take 1 capsule by mouth daily.         No current facility-administered medications on file prior to visit.      Review of Systems Review of Systems  Constitutional: Negative for fever, appetite change, fatigue and unexpected weight change.  Eyes: Negative for pain and  visual disturbance.  Respiratory: Negative for cough and shortness of breath.   Cardiovascular: Negative for cp or palpitations    Gastrointestinal: Negative for nausea, diarrhea and constipation.  Genitourinary: Negative for urgency and frequency.  Skin: Negative for pallor pos for dry pink skin on eyelids and dry patches on face  Neurological: Negative for weakness, light-headedness, numbness and headaches.  Hematological: Negative for adenopathy. Does not bruise/bleed easily.  Psychiatric/Behavioral: Negative for dysphoric mood. The patient is not nervous/anxious.         Objective:   Physical Exam  Constitutional: She appears well-developed and well-nourished. No distress.  HENT:  Head: Normocephalic and atraumatic.  Eyes: Conjunctivae and EOM are normal. Pupils are equal, round, and reactive to light. Right eye exhibits no discharge. Left eye exhibits no discharge.  Neck: Normal range of motion. Neck supple.  Cardiovascular: Normal rate and  regular rhythm.   Pulmonary/Chest: Effort normal and breath sounds normal. She has no wheezes.  Lymphadenopathy:    She has no cervical adenopathy.  Neurological: She is alert. No cranial nerve deficit.  Skin: Skin is warm and dry. No rash noted. No erythema.  Resolution of prior reported erythema and swelling of face  Some pink scale on eyelids that has become baseline  No vesicles or skin breakdown   Psychiatric: She has a normal mood and affect.          Assessment & Plan:   Problem List Items Addressed This Visit     Other   Red skin - Primary     Symptoms over the weekend sounded inflammatory- with redness and swelling of R face Total resolution with benadryl-so doubt infection  Will continue to watch ? 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

## 2014-07-27 NOTE — Assessment & Plan Note (Signed)
Symptoms over the weekend sounded inflammatory- with redness and swelling of R face Total resolution with benadryl-so doubt infection  Will continue to watch ? 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

## 2014-07-27 NOTE — Patient Instructions (Signed)
I'm glad you are doing better  Continue benadryl if needed - I suspect that since the redness/swelling responded to benadryl this was an allergic reaction to something  Watch out for recurrent symptoms and keep me posted

## 2014-07-27 NOTE — Progress Notes (Signed)
Pre visit review using our clinic review tool, if applicable. No additional management support is needed unless otherwise documented below in the visit note. 

## 2014-12-25 ENCOUNTER — Other Ambulatory Visit: Payer: Self-pay | Admitting: Family Medicine

## 2015-01-18 ENCOUNTER — Other Ambulatory Visit: Payer: Self-pay | Admitting: Family Medicine

## 2015-02-25 ENCOUNTER — Ambulatory Visit (INDEPENDENT_AMBULATORY_CARE_PROVIDER_SITE_OTHER): Payer: Medicare HMO | Admitting: Primary Care

## 2015-02-25 ENCOUNTER — Encounter: Payer: Self-pay | Admitting: Primary Care

## 2015-02-25 VITALS — BP 122/70 | HR 69 | Temp 98.2°F | Ht 62.5 in | Wt 146.1 lb

## 2015-02-25 DIAGNOSIS — R05 Cough: Secondary | ICD-10-CM

## 2015-02-25 DIAGNOSIS — R059 Cough, unspecified: Secondary | ICD-10-CM

## 2015-02-25 MED ORDER — BENZONATATE 200 MG PO CAPS: 200.0000 mg | ORAL_CAPSULE | Freq: Three times a day (TID) | ORAL | Status: DC | PRN

## 2015-02-25 MED ORDER — AMOXICILLIN-POT CLAVULANATE 875-125 MG PO TABS: 1.0000 | ORAL_TABLET | Freq: Two times a day (BID) | ORAL | Status: DC

## 2015-02-25 NOTE — Progress Notes (Signed)
Subjective:    Patient ID: [name], female    DOB: 1940/12/28, 73 y.o.   MRN: [id number]  HPI  [name] is a 74 year old female who presents today with a chief complaint of cough. The cough started 2 weeks ago and is productive (greenish, brownish). She also reports sinus pressure and congestion for one week. She's taken Advil cold and sinus, and mucinex without any relief. Overall she's not feeling any better over the past 2 weeks and the cough has been persistent. Denies nausea, shortness of breath, chest pain.  Review of Systems  Constitutional: Positive for fever and fatigue.  HENT: Positive for congestion and sinus pressure. Negative for ear pain, rhinorrhea and sore throat.   Respiratory: Positive for cough. Negative for shortness of breath.   Cardiovascular: Negative for chest pain.  Gastrointestinal: Negative for nausea.  Musculoskeletal: Negative for myalgias.       Past Medical History  Diagnosis Date  . Osteopenia   . Back pain   . Hyperlipidemia   . Hypertension   . Chronic constipation     History   Social History  . Marital Status: Married    Spouse Name: N/A  . Number of Children: N/A  . Years of Education: N/A   Occupational History  . works at a [employer]  . Smoking status: Never Smoker   . Smokeless tobacco: Never Used     Comment: smoked 1 year after high school, never since  . Alcohol Use: No  . Drug Use: No  . Sexual Activity: Not on file   Other Topics Concern  . Not on file   Social History Narrative    Past Surgical History  Procedure Laterality Date  . Cleft palate repair    . Tubal ligation    . Nephrectomy living donor      Left  . Foot neuroma surgery  2/06    Family History  Problem Relation Age of Onset  . Colon cancer Mother   . Diabetes Mother   . Coronary artery disease Mother   . Osteoporosis Mother   . Dementia Mother   . Coronary artery disease Father   . Heart failure  Father   . Hypertension Father   . Stomach cancer Neg Hx     Allergies  Allergen Reactions  . Codeine     Current Outpatient Prescriptions on File Prior to Visit  Medication Sig Dispense Refill  . aspirin 81 MG tablet Take 81 mg by mouth daily.    Marland Kitchen atorvastatin (LIPITOR) 10 MG tablet TAKE 1 TABLET BY MOUTH DAILY 90 tablet 0  . bisacodyl (DULCOLAX) 5 MG EC tablet Take 5 mg by mouth daily as needed.      . Cholecalciferol (D 1000) 1000 UNITS tablet Take 1,000 Units by mouth daily.      Marland Kitchen lisinopril (PRINIVIL,ZESTRIL) 10 MG tablet Take 1 tablet (10 mg total) by mouth daily. MUST SCHEDULE ANNUAL PHYSICAL 90 tablet 0  . Multiple Vitamins-Minerals (CENTRUM SILVER) tablet Take 1 tablet by mouth daily.      . Omega-3 Fatty Acids (FISH OIL PO) Take 1 capsule by mouth daily.      Marland Kitchen desonide (DESOWEN) 0.05 % cream Apply topically 2 (two) times daily. To affected areas (Patient not taking: Reported on 02/25/2015) 15 g 1   No current facility-administered medications on file prior to visit.    BP 122/70 mmHg  Pulse 69  Temp(Src) 98.2  F (36.8 C) (Oral)  Ht 5' 2.5" (1.588 m)  Wt 146 lb 1.9 oz (66.28 kg)  BMI 26.28 kg/m2  SpO2 98%    Objective:   Physical Exam  Constitutional: She is oriented to person, place, and time. She appears well-nourished. She appears ill.  HENT:  Right Ear: Tympanic membrane and ear canal normal.  Left Ear: Tympanic membrane and ear canal normal.  Nose: Right sinus exhibits frontal sinus tenderness. Right sinus exhibits no maxillary sinus tenderness. Left sinus exhibits frontal sinus tenderness. Left sinus exhibits no maxillary sinus tenderness.  Mouth/Throat: Oropharynx is clear and moist.  Eyes: Conjunctivae are normal. Pupils are equal, round, and reactive to light.  Neck: Neck supple.  Cardiovascular: Normal rate and regular rhythm.   Pulmonary/Chest: Effort normal and breath sounds normal.  Lymphadenopathy:    She has no cervical adenopathy.    Neurological: She is alert and oriented to person, place, and time.  Skin: Skin is warm and dry.          Assessment & Plan:  Bronchitis vs. Sinusitis  Cough present for 2 weeks. Sinus pressure and congestion for 1 week. Due to duration, symptoms, presentation, will treat with 10 day course of Augmentin. Tessalon Pearls PRN for cough. Push fluids. Follow up if no improvement in 3-4 days.

## 2015-02-25 NOTE — Progress Notes (Signed)
Pre visit review using our clinic review tool, if applicable. No additional management support is needed unless otherwise documented below in the visit note. 

## 2015-02-25 NOTE — Patient Instructions (Signed)
Start Augmentin antibiotics. Take 1 tablet by mouth twice daily for 10 days. You may take the Tessalon Pearls three times daily as needed for cough. Drink plenty of fluids and get some rest. Follow up if you're not starting to feel better in 3-4 days.  Sinusitis Sinusitis is redness, soreness, and inflammation of the paranasal sinuses. Paranasal sinuses are air pockets within the bones of your face (beneath the eyes, the middle of the forehead, or above the eyes). In healthy paranasal sinuses, mucus is able to drain out, and air is able to circulate through them by way of your nose. However, when your paranasal sinuses are inflamed, mucus and air can become trapped. This can allow bacteria and other germs to grow and cause infection. Sinusitis can develop quickly and last only a short time (acute) or continue over a long period (chronic). Sinusitis that lasts for more than 12 weeks is considered chronic.  CAUSES  Causes of sinusitis include:  Allergies.  Structural abnormalities, such as displacement of the cartilage that separates your nostrils (deviated septum), which can decrease the air flow through your nose and sinuses and affect sinus drainage.  Functional abnormalities, such as when the small hairs (cilia) that line your sinuses and help remove mucus do not work properly or are not present. SIGNS AND SYMPTOMS  Symptoms of acute and chronic sinusitis are the same. The primary symptoms are pain and pressure around the affected sinuses. Other symptoms include:  Upper toothache.  Earache.  Headache.  Bad breath.  Decreased sense of smell and taste.  A cough, which worsens when you are lying flat.  Fatigue.  Fever.  Thick drainage from your nose, which often is green and may contain pus (purulent).  Swelling and warmth over the affected sinuses. DIAGNOSIS  Your health care provider will perform a physical exam. During the exam, your health care provider may:  Look in your  nose for signs of abnormal growths in your nostrils (nasal polyps).  Tap over the affected sinus to check for signs of infection.  View the inside of your sinuses (endoscopy) using an imaging device that has a light attached (endoscope). If your health care provider suspects that you have chronic sinusitis, one or more of the following tests may be recommended:  Allergy tests.  Nasal culture. A sample of mucus is taken from your nose, sent to a lab, and screened for bacteria.  Nasal cytology. A sample of mucus is taken from your nose and examined by your health care provider to determine if your sinusitis is related to an allergy. TREATMENT  Most cases of acute sinusitis are related to a viral infection and will resolve on their own within 10 days. Sometimes medicines are prescribed to help relieve symptoms (pain medicine, decongestants, nasal steroid sprays, or saline sprays).  However, for sinusitis related to a bacterial infection, your health care provider will prescribe antibiotic medicines. These are medicines that will help kill the bacteria causing the infection.  Rarely, sinusitis is caused by a fungal infection. In theses cases, your health care provider will prescribe antifungal medicine. For some cases of chronic sinusitis, surgery is needed. Generally, these are cases in which sinusitis recurs more than 3 times per year, despite other treatments. HOME CARE INSTRUCTIONS   Drink plenty of water. Water helps thin the mucus so your sinuses can drain more easily.  Use a humidifier.  Inhale steam 3 to 4 times a day (for example, sit in the bathroom with the shower running).  Apply a warm, moist washcloth to your face 3 to 4 times a day, or as directed by your health care provider.  Use saline nasal sprays to help moisten and clean your sinuses.  Take medicines only as directed by your health care provider.  If you were prescribed either an antibiotic or antifungal medicine,  finish it all even if you start to feel better. SEEK IMMEDIATE MEDICAL CARE IF:  You have increasing pain or severe headaches.  You have nausea, vomiting, or drowsiness.  You have swelling around your face.  You have vision problems.  You have a stiff neck.  You have difficulty breathing. MAKE SURE YOU:   Understand these instructions.  Will watch your condition.  Will get help right away if you are not doing well or get worse. Document Released: 09/25/2005 Document Revised: 02/09/2014 Document Reviewed: 10/10/2011 [facility] Patient Information 2015 [place], [place]. This information is not intended to replace advice given to you by your health care provider. Make sure you discuss any questions you have with your health care provider.

## 2015-04-13 ENCOUNTER — Other Ambulatory Visit: Payer: Self-pay | Admitting: Family Medicine

## 2015-04-14 NOTE — Telephone Encounter (Signed)
Please schedule PE and refill until then  

## 2015-04-14 NOTE — Telephone Encounter (Signed)
Electronic refill request, pt's last CPE was 11/14/13, she has had a few acute appt since but no recent f/u or CPE, please advise

## 2015-05-27 ENCOUNTER — Telehealth: Payer: Self-pay | Admitting: Family Medicine

## 2015-05-27 DIAGNOSIS — R739 Hyperglycemia, unspecified: Secondary | ICD-10-CM

## 2015-05-27 DIAGNOSIS — I10 Essential (primary) hypertension: Secondary | ICD-10-CM

## 2015-05-27 DIAGNOSIS — E785 Hyperlipidemia, unspecified: Secondary | ICD-10-CM

## 2015-05-27 NOTE — Telephone Encounter (Signed)
-----   Message from Ellamae Sia sent at 05/20/2015  2:58 PM EDT ----- Regarding: Lab orders for Friday, 8.20.16 Patient is scheduled for CPX labs, please order future labs, Thanks , Karna Christmas

## 2015-05-28 ENCOUNTER — Other Ambulatory Visit (INDEPENDENT_AMBULATORY_CARE_PROVIDER_SITE_OTHER): Payer: Medicare HMO

## 2015-05-28 DIAGNOSIS — E785 Hyperlipidemia, unspecified: Secondary | ICD-10-CM | POA: Diagnosis not present

## 2015-05-28 DIAGNOSIS — R739 Hyperglycemia, unspecified: Secondary | ICD-10-CM | POA: Diagnosis not present

## 2015-05-28 DIAGNOSIS — I10 Essential (primary) hypertension: Secondary | ICD-10-CM | POA: Diagnosis not present

## 2015-05-28 LAB — COMPREHENSIVE METABOLIC PANEL
ALT: 15 U/L (ref 0–35)
AST: 20 U/L (ref 0–37)
Albumin: 4.1 g/dL (ref 3.5–5.2)
Alkaline Phosphatase: 81 U/L (ref 39–117)
BUN: 25 mg/dL — ABNORMAL HIGH (ref 6–23)
CO2: 27 mEq/L (ref 19–32)
Calcium: 9.5 mg/dL (ref 8.4–10.5)
Chloride: 105 mEq/L (ref 96–112)
Creatinine, Ser: 1.02 mg/dL (ref 0.40–1.20)
GFR: 56.49 mL/min — ABNORMAL LOW (ref >60.00)
Glucose, Bld: 113 mg/dL — ABNORMAL HIGH (ref 70–99)
Potassium: 4.4 mEq/L (ref 3.5–5.1)
Sodium: 139 mEq/L (ref 135–145)
Total Bilirubin: 0.4 mg/dL (ref 0.2–1.2)
Total Protein: 6.7 g/dL (ref 6.0–8.3)

## 2015-05-28 LAB — CBC WITH DIFFERENTIAL/PLATELET
Basophils Absolute: 0 10*3/uL (ref 0.0–0.1)
Basophils Relative: 0.6 % (ref 0.0–3.0)
Eosinophils Absolute: 0.2 10*3/uL (ref 0.0–0.7)
Eosinophils Relative: 3.8 % (ref 0.0–5.0)
HCT: 38.3 % (ref 36.0–46.0)
Hemoglobin: 13 g/dL (ref 12.0–15.0)
Lymphocytes Relative: 24.5 % (ref 12.0–46.0)
Lymphs Abs: 1.6 10*3/uL (ref 0.7–4.0)
MCHC: 33.9 g/dL (ref 30.0–36.0)
MCV: 92.3 fl (ref 78.0–100.0)
Monocytes Absolute: 0.3 10*3/uL (ref 0.1–1.0)
Monocytes Relative: 5.3 % (ref 3.0–12.0)
Neutro Abs: 4.3 10*3/uL (ref 1.4–7.7)
Neutrophils Relative %: 65.8 % (ref 43.0–77.0)
Platelets: 215 10*3/uL (ref 150.0–400.0)
RBC: 4.14 Mil/uL (ref 3.87–5.11)
RDW: 12.8 % (ref 11.5–15.5)
WBC: 6.5 10*3/uL (ref 4.0–10.5)

## 2015-05-28 LAB — TSH: TSH: 2.47 u[IU]/mL (ref 0.35–4.50)

## 2015-05-28 LAB — LIPID PANEL
Cholesterol: 158 mg/dL (ref 0–200)
HDL: 38.2 mg/dL — ABNORMAL LOW (ref >39.00)
LDL Cholesterol: 93 mg/dL (ref 0–99)
NonHDL: 119.74
Total CHOL/HDL Ratio: 4
Triglycerides: 134 mg/dL (ref 0.0–149.0)
VLDL: 26.8 mg/dL (ref 0.0–40.0)

## 2015-05-28 LAB — HEMOGLOBIN A1C: Hgb A1c MFr Bld: 5.9 % (ref 4.6–6.5)

## 2015-06-11 ENCOUNTER — Ambulatory Visit (INDEPENDENT_AMBULATORY_CARE_PROVIDER_SITE_OTHER): Payer: Medicare HMO | Admitting: Family Medicine

## 2015-06-11 ENCOUNTER — Encounter: Payer: Self-pay | Admitting: Family Medicine

## 2015-06-11 VITALS — BP 124/72 | HR 83 | Temp 98.7°F | Ht 62.5 in | Wt 145.0 lb

## 2015-06-11 DIAGNOSIS — E785 Hyperlipidemia, unspecified: Secondary | ICD-10-CM

## 2015-06-11 DIAGNOSIS — M899 Disorder of bone, unspecified: Secondary | ICD-10-CM

## 2015-06-11 DIAGNOSIS — R739 Hyperglycemia, unspecified: Secondary | ICD-10-CM

## 2015-06-11 DIAGNOSIS — Z23 Encounter for immunization: Secondary | ICD-10-CM | POA: Diagnosis not present

## 2015-06-11 DIAGNOSIS — I10 Essential (primary) hypertension: Secondary | ICD-10-CM

## 2015-06-11 DIAGNOSIS — M949 Disorder of cartilage, unspecified: Secondary | ICD-10-CM

## 2015-06-11 DIAGNOSIS — Z0000 Encounter for general adult medical examination without abnormal findings: Secondary | ICD-10-CM | POA: Diagnosis not present

## 2015-06-11 NOTE — Progress Notes (Signed)
Pre visit review using our clinic review tool, if applicable. No additional management support is needed unless otherwise documented below in the visit note. 

## 2015-06-11 NOTE — Progress Notes (Signed)
Subjective:    Patient ID: [name], female    DOB: [birth year], 74 y.o.   MRN: [id number]  HPI Here for annual medicare wellness visit as well as chronic/acute medical problems and also annual preventative exam  I have personally reviewed the Medicare Annual Wellness questionnaire and have noted 1. The patient's medical and social history 2. Their use of alcohol, tobacco or illicit drugs 3. Their current medications and supplements 4. The patient's functional ability including ADL's, fall risks, home safety risks and hearing or visual             impairment. 5. Diet and physical activities 6. Evidence for depression or mood disorders  The patients weight, height, BMI have been recorded in the chart and visual acuity is per eye clinic.  I have made referrals, counseling and provided education to the patient based review of the above and I have provided the pt with a written personalized care plan for preventive services. Reviewed and updated provider list, see scanned forms.  Feeling fine   See scanned forms.  Routine anticipatory guidance given to patient.  See health maintenance. Colon cancer screening 3/15 normal (fam hx)  Breast cancer screening 2/15- has not had one this year  Self breast exam no lumps  No gyn problems  Flu vaccine- will get that today Tetanus vaccine 4/12  Pneumovax 2/15 - will get prevnar today Zoster vaccine 2/13  dexa 2/15 - stable with osteopenia , no falls or fractures  Advance directive - has a living will and POA - done this year  Cognitive function addressed- see scanned forms- and if abnormal then additional documentation follows.  Some standard issues/ misplaces things   PMH and SH reviewed  Meds, vitals, and allergies reviewed.   ROS: See HPI.  Otherwise negative.    bp is stable today  No cp or palpitations or headaches or edema  No side effects to medicines  BP Readings from Last 3 Encounters:  06/11/15 124/72  02/25/15 122/70   07/27/14 146/94     Cholesterol Statin and diet  Lab Results  Component Value Date   CHOL 158 05/28/2015   CHOL 156 11/07/2013   CHOL 145 03/08/2012   Lab Results  Component Value Date   HDL 38.20* 05/28/2015   HDL 42.80 11/07/2013   HDL 42.60 03/08/2012   Lab Results  Component Value Date   LDLCALC 93 05/28/2015   LDLCALC 81 11/07/2013   LDLCALC 83 03/08/2012   Lab Results  Component Value Date   TRIG 134.0 05/28/2015   TRIG 161.0* 11/07/2013   TRIG 95.0 03/08/2012   Lab Results  Component Value Date   CHOLHDL 4 05/28/2015   CHOLHDL 4 11/07/2013   CHOLHDL 3 03/08/2012   No results found for: LDLDIRECT HDL is too low - not enough exercise - [facility]  Only walks periodically    Hyperglycemia Lab Results  Component Value Date   HGBA1C 5.9 05/28/2015      Chemistry      Component Value Date/Time   NA 139 05/28/2015 0914   K 4.4 05/28/2015 0914   CL 105 05/28/2015 0914   CO2 27 05/28/2015 0914   BUN 25* 05/28/2015 0914   CREATININE 1.02 05/28/2015 0914      Component Value Date/Time   CALCIUM 9.5 05/28/2015 0914   ALKPHOS 81 05/28/2015 0914   AST 20 05/28/2015 0914   ALT 15 05/28/2015 0914   BILITOT 0.4  05/28/2015 0914      Lab Results  Component Value Date   WBC 6.5 05/28/2015   HGB 13.0 05/28/2015   HCT 38.3 05/28/2015   MCV 92.3 05/28/2015   PLT 215.0 05/28/2015    Lab Results  Component Value Date   TSH 2.47 05/28/2015     Wt is down 1 lb with bmi of 26    Patient Active Problem List   Diagnosis Date Noted  . Routine general medical examination at a health care facility 06/11/2015  . Red skin 07/27/2014  . Encounter for Medicare annual wellness exam 11/14/2013  . Dermatitis 11/14/2013  . Family history of colon cancer 11/14/2013  . Colon cancer screening 11/14/2013  . Right hip pain 01/31/2013  . Hypertension 01/27/2011  . Post-menopausal 01/27/2011  . BACK PAIN 03/13/2008  . Hyperlipidemia  12/20/2007  . CONSTIPATION, CHRONIC 12/20/2007  . Disorder of bone and cartilage 12/20/2007  . Hyperglycemia 12/20/2007   Past Medical History  Diagnosis Date  . Osteopenia   . Back pain   . Hyperlipidemia   . Hypertension   . Chronic constipation    Past Surgical History  Procedure Laterality Date  . Cleft palate repair    . Tubal ligation    . Nephrectomy living donor      Left  . Foot neuroma surgery  2/06   Social History  Substance Use Topics  . Smoking status: Never Smoker   . Smokeless tobacco: Never Used     Comment: smoked 1 year after high school, never since  . Alcohol Use: No   Family History  Problem Relation Age of Onset  . Colon cancer Mother   . Diabetes Mother   . Coronary artery disease Mother   . Osteoporosis Mother   . Dementia Mother   . Coronary artery disease Father   . Heart failure Father   . Hypertension Father   . Stomach cancer Neg Hx    Allergies  Allergen Reactions  . Codeine    Current Outpatient Prescriptions on File Prior to Visit  Medication Sig Dispense Refill  . aspirin 81 MG tablet Take 81 mg by mouth daily.    Marland Kitchen atorvastatin (LIPITOR) 10 MG tablet Take 1 tablet (10 mg total) by mouth daily. 90 tablet 0  . bisacodyl (DULCOLAX) 5 MG EC tablet Take 5 mg by mouth daily as needed.      Marland Kitchen lisinopril (PRINIVIL,ZESTRIL) 10 MG tablet TAKE ONE(1) TABLET BY MOUTH DAILY. 90 tablet 0  . Multiple Vitamins-Minerals (CENTRUM SILVER) tablet Take 1 tablet by mouth daily.      Marland Kitchen desonide (DESOWEN) 0.05 % cream Apply topically 2 (two) times daily. To affected areas (Patient not taking: Reported on 06/11/2015) 15 g 1   No current facility-administered medications on file prior to visit.    Review of Systems Review of Systems  Constitutional: Negative for fever, appetite change, fatigue and unexpected weight change.  Eyes: Negative for pain and visual disturbance.  Respiratory: Negative for cough and shortness of breath.   Cardiovascular:  Negative for cp or palpitations    Gastrointestinal: Negative for nausea, diarrhea and constipation.  Genitourinary: Negative for urgency and frequency.  Skin: Negative for pallor or rash   Neurological: Negative for weakness, light-headedness, numbness and headaches.  Hematological: Negative for adenopathy. Does not bruise/bleed easily.  Psychiatric/Behavioral: Negative for dysphoric mood. The patient is not nervous/anxious.         Objective:   Physical Exam  Constitutional: She appears well-developed  and well-nourished. No distress.  Well appearing   HENT:  Head: Normocephalic and atraumatic.  Right Ear: External ear normal.  Left Ear: External ear normal.  Mouth/Throat: Oropharynx is clear and moist.  Eyes: Conjunctivae and EOM are normal. Pupils are equal, round, and reactive to light. No scleral icterus.  Neck: Normal range of motion. Neck supple. No JVD present. Carotid bruit is not present. No thyromegaly present.  Cardiovascular: Normal rate, regular rhythm, normal heart sounds and intact distal pulses.  Exam reveals no gallop.   Pulmonary/Chest: Effort normal and breath sounds normal. No respiratory distress. She has no wheezes. She exhibits no tenderness.  Abdominal: Soft. Bowel sounds are normal. She exhibits no distension, no abdominal bruit and no mass. There is no tenderness.  Genitourinary: No breast swelling, tenderness, discharge or bleeding.  Breast exam: No mass, nodules, thickening, tenderness, bulging, retraction, inflamation, nipple discharge or skin changes noted.  No axillary or clavicular LA.      Musculoskeletal: Normal range of motion. She exhibits no edema or tenderness.  Lymphadenopathy:    She has no cervical adenopathy.  Neurological: She is alert. She has normal reflexes. No cranial nerve deficit. She exhibits normal muscle tone. Coordination normal.  Skin: Skin is warm and dry. No rash noted. No erythema. No pallor.  Lentigo diffusely  Psychiatric:  She has a normal mood and affect.          Assessment & Plan:   Problem List Items Addressed This Visit      Cardiovascular and Mediastinum   Hypertension - Primary    bp in fair control at this time  BP Readings from Last 1 Encounters:  06/11/15 124/72   No changes needed Disc lifstyle change with low sodium diet and exercise  Labs reviewed         Musculoskeletal and Integument   Disorder of bone and cartilage    Stable osteopenia 2/15 No falls or fx Disc need for calcium/ vitamin D/ wt bearing exercise and bone density test every 2 y to monitor Disc safety/ fracture risk in detail          Other   Encounter for Medicare annual wellness exam    Reviewed health habits including diet and exercise and skin cancer prevention Reviewed appropriate screening tests for age  Also reviewed health mt list, fam hx and immunization status , as well as social and family history   See HPI Nl memory changes for age-no red flags Labs reviewed Schedule your annual mammogram please  Flu shot today  prevnar vaccine today  Try to get 1200-1500 mg of calcium per day with at least 1000 iu of vitamin D - for bone health  If you cannot take the calcium- at least take the vitamin D Start back exercising       Hyperglycemia    Lab Results  Component Value Date   HGBA1C 5.9 05/28/2015   Overall controlled with lifestyle Disc imp of low glycemic diet and exercise to prevent DM      Hyperlipidemia    Disc goals for lipids and reasons to control them Rev labs with pt Rev low sat fat diet in detail Will continue atorvastatin which she tolerates well       Routine general medical examination at a health care facility    Reviewed health habits including diet and exercise and skin cancer prevention Reviewed appropriate screening tests for age  Also reviewed health mt list, fam hx and immunization status , as  well as social and family history   See HPI Nl memory changes for age-no  red flags Labs reviewed Schedule your annual mammogram please  Flu shot today  prevnar vaccine today        Other Visit Diagnoses    Need for prophylactic vaccination and inoculation against influenza        Relevant Orders    Flu Vaccine QUAD 36+ mos IM (Completed)    Need for vaccination with 13-polyvalent pneumococcal conjugate vaccine        Relevant Orders    Pneumococcal conjugate vaccine 13-valent IM (Completed)

## 2015-06-11 NOTE — Patient Instructions (Signed)
Schedule your annual mammogram please  Flu shot today  prevnar vaccine today  Try to get 1200-1500 mg of calcium per day with at least 1000 iu of vitamin D - for bone health  If you cannot take the calcium- at least take the vitamin D Start back exercising

## 2015-06-13 NOTE — Assessment & Plan Note (Signed)
bp in fair control at this time  BP Readings from Last 1 Encounters:  06/11/15 124/72   No changes needed Disc lifstyle change with low sodium diet and exercise  Labs reviewed

## 2015-06-13 NOTE — Assessment & Plan Note (Signed)
Reviewed health habits including diet and exercise and skin cancer prevention Reviewed appropriate screening tests for age  Also reviewed health mt list, fam hx and immunization status , as well as social and family history   See HPI Nl memory changes for age-no red flags Labs reviewed Schedule your annual mammogram please  Flu shot today  prevnar vaccine today  Try to get 1200-1500 mg of calcium per day with at least 1000 iu of vitamin D - for bone health  If you cannot take the calcium- at least take the vitamin D Start back exercising

## 2015-06-13 NOTE — Assessment & Plan Note (Signed)
Disc goals for lipids and reasons to control them Rev labs with pt Rev low sat fat diet in detail Will continue atorvastatin which she tolerates well

## 2015-06-13 NOTE — Assessment & Plan Note (Signed)
Reviewed health habits including diet and exercise and skin cancer prevention Reviewed appropriate screening tests for age  Also reviewed health mt list, fam hx and immunization status , as well as social and family history   See HPI Nl memory changes for age-no red flags Labs reviewed Schedule your annual mammogram please  Flu shot today  prevnar vaccine today

## 2015-06-13 NOTE — Assessment & Plan Note (Signed)
Lab Results  Component Value Date   HGBA1C 5.9 05/28/2015   Overall controlled with lifestyle Disc imp of low glycemic diet and exercise to prevent DM

## 2015-06-13 NOTE — Assessment & Plan Note (Signed)
Stable osteopenia 2/15 No falls or fx Disc need for calcium/ vitamin D/ wt bearing exercise and bone density test every 2 y to monitor Disc safety/ fracture risk in detail

## 2015-07-20 ENCOUNTER — Other Ambulatory Visit: Payer: Self-pay | Admitting: Family Medicine

## 2015-10-12 DIAGNOSIS — R69 Illness, unspecified: Secondary | ICD-10-CM | POA: Diagnosis not present

## 2015-10-28 ENCOUNTER — Ambulatory Visit: Payer: Medicare HMO | Admitting: Family Medicine

## 2015-11-05 ENCOUNTER — Encounter: Payer: Self-pay | Admitting: Family Medicine

## 2015-11-05 ENCOUNTER — Ambulatory Visit (INDEPENDENT_AMBULATORY_CARE_PROVIDER_SITE_OTHER): Payer: Medicare HMO | Admitting: Family Medicine

## 2015-11-05 VITALS — Ht 63.0 in | Wt 140.0 lb

## 2015-11-05 DIAGNOSIS — M25512 Pain in left shoulder: Secondary | ICD-10-CM

## 2015-11-05 DIAGNOSIS — M25561 Pain in right knee: Secondary | ICD-10-CM | POA: Diagnosis not present

## 2015-11-05 NOTE — Patient Instructions (Signed)
Your knee pain is due to arthritis. These are the 4 classes of medicine you can use for this: Tylenol 500mg  1-2 tabs three times a day for pain. Glucosamine sulfate 750mg  twice a day is a supplement that may help. Capsaicin, aspercreme, or biofreeze topically up to four times a day may also help with pain. Aleve 1-2 tabs twice a day with food Cortisone injections are an option. If cortisone injections do not help, there are different types of shots that may help but they take longer to take effect. It's important that you continue to stay active. Straight leg raises, knee extensions 3 sets of 10 once a day (add ankle weight if these become too easy). Consider physical therapy to strengthen muscles around the joint that hurts to take pressure off of the joint itself. Shoe inserts with good arch support may be helpful. Ethier or cane if needed. Heat or ice 15 minutes at a time 3-4 times a day as needed to help with pain. Water aerobics and cycling with low resistance are the best two types of exercise for arthritis. Follow up with me as needed.  You have rotator cuff impingement of your left shoulder. Try to avoid painful activities (overhead activities, lifting with extended arm) as much as possible. Medicines as noted above. Subacromial injection may be beneficial to help with pain and to decrease inflammation. Consider physical therapy with transition to home exercise program. Do home exercise program with theraband and scapular stabilization exercises daily - these are very important for long term relief even if an injection was given.  3 sets of 10 once a day. If not improving at follow-up we will consider further imaging, injection, physical therapy, and/or nitro patches. Follow up with me in 6 weeks or as needed for this issue.

## 2015-11-09 DIAGNOSIS — M25512 Pain in left shoulder: Secondary | ICD-10-CM | POA: Insufficient documentation

## 2015-11-09 DIAGNOSIS — M25561 Pain in right knee: Secondary | ICD-10-CM | POA: Insufficient documentation

## 2015-11-09 NOTE — Progress Notes (Signed)
PCP: Loura Pardon, MD  Subjective:   HPI: Patient is a 75 y.o. female here for right knee pain, left shoulder pain.  Patient reports she's had anterior right knee pain for years but worse past 2 months. Pain 1/10 at rest, dull but up to 8/10 and sharp with a lot of walking. Otherwise pain comes and goes. No locking, giving out. Last week was limping and swollen. Some mild pain left shoulder also with overhead activities. No skin changes, fever, other complaints.  Past Medical History  Diagnosis Date  . Osteopenia   . Back pain   . Hyperlipidemia   . Hypertension   . Chronic constipation     Current Outpatient Prescriptions on File Prior to Visit  Medication Sig Dispense Refill  . aspirin 81 MG tablet Take 81 mg by mouth daily.    Marland Kitchen atorvastatin (LIPITOR) 10 MG tablet TAKE 1 TABLET(10 MG) BY MOUTH DAILY 90 tablet 1  . bisacodyl (DULCOLAX) 5 MG EC tablet Take 5 mg by mouth daily as needed.      . desonide (DESOWEN) 0.05 % cream Apply topically 2 (two) times daily. To affected areas (Patient not taking: Reported on 06/11/2015) 15 g 1  . lisinopril (PRINIVIL,ZESTRIL) 10 MG tablet TAKE 1 TABLET BY MOUTH DAILY 90 tablet 1  . Multiple Vitamins-Minerals (CENTRUM SILVER) tablet Take 1 tablet by mouth daily.       No current facility-administered medications on file prior to visit.    Past Surgical History  Procedure Laterality Date  . Cleft palate repair    . Tubal ligation    . Nephrectomy living donor      Left  . Foot neuroma surgery  2/06    Allergies  Allergen Reactions  . Codeine     Social History   Social History  . Marital Status: Married    Spouse Name: N/A  . Number of Children: N/A  . Years of Education: N/A   Occupational History  . works at a [employer]  . Smoking status: Never Smoker   . Smokeless tobacco: Never Used     Comment: smoked 1 year after high school, never since  . Alcohol Use: No  . Drug Use: No  . Sexual  Activity: Not on file   Other Topics Concern  . Not on file   Social History Narrative    Family History  Problem Relation Age of Onset  . Colon cancer Mother   . Diabetes Mother   . Coronary artery disease Mother   . Osteoporosis Mother   . Dementia Mother   . Coronary artery disease Father   . Heart failure Father   . Hypertension Father   . Stomach cancer Neg Hx     Ht 5\' 3"  (1.6 m)  Wt 140 lb (63.504 kg)  BMI 24.81 kg/m2  Review of Systems: See HPI above.    Objective:  Physical Exam:  Gen: NAD  Right knee: No gross deformity, ecchymoses.  Minimal effusion. TTP mild medial joint line. FROM. Negative ant/post drawers. Negative valgus/varus testing. Negative lachmanns. Negative mcmurrays, apleys, patellar apprehension. NV intact distally.  Left knee: FROM without pain.  Left shoulder: No swelling, ecchymoses.  No gross deformity. No TTP. FROM with mild painful arc. Negative Hawkins, Neers. Negative Speeds, Yergasons. Strength 5/5 with empty can and resisted internal/external rotation. Negative apprehension. NV intact distally.  Right shoulder: FROM without pain.    Assessment & Plan:  1. Right knee  pain - consistent with DJD>  Discussed tylenol, nsaids, glucosamine, topical medications.  Declined cortisone injection or physical therapy for now.  Heat/ice, arch supports.  F/u prn.  2. Left shoulder rotator cuff impingement - shown home exercises to do daily.  Medicines as noted above.  Consider injection, PT, nitro patches if not improving.

## 2015-11-09 NOTE — Assessment & Plan Note (Signed)
Left shoulder rotator cuff impingement - shown home exercises to do daily.  Medicines as noted above.  Consider injection, PT, nitro patches if not improving.

## 2015-11-09 NOTE — Assessment & Plan Note (Signed)
consistent with DJD>  Discussed tylenol, nsaids, glucosamine, topical medications.  Declined cortisone injection or physical therapy for now.  Heat/ice, arch supports.  F/u prn.

## 2015-12-06 DIAGNOSIS — M7121 Synovial cyst of popliteal space [Baker], right knee: Secondary | ICD-10-CM | POA: Diagnosis not present

## 2015-12-06 DIAGNOSIS — M2241 Chondromalacia patellae, right knee: Secondary | ICD-10-CM | POA: Diagnosis not present

## 2015-12-06 DIAGNOSIS — M25561 Pain in right knee: Secondary | ICD-10-CM | POA: Diagnosis not present

## 2015-12-06 DIAGNOSIS — M1711 Unilateral primary osteoarthritis, right knee: Secondary | ICD-10-CM | POA: Diagnosis not present

## 2015-12-20 DIAGNOSIS — M25561 Pain in right knee: Secondary | ICD-10-CM | POA: Diagnosis not present

## 2015-12-20 DIAGNOSIS — M2241 Chondromalacia patellae, right knee: Secondary | ICD-10-CM | POA: Diagnosis not present

## 2015-12-20 DIAGNOSIS — M7121 Synovial cyst of popliteal space [Baker], right knee: Secondary | ICD-10-CM | POA: Diagnosis not present

## 2015-12-20 DIAGNOSIS — M1711 Unilateral primary osteoarthritis, right knee: Secondary | ICD-10-CM | POA: Diagnosis not present

## 2015-12-22 ENCOUNTER — Other Ambulatory Visit: Payer: Self-pay

## 2015-12-22 DIAGNOSIS — Z1231 Encounter for screening mammogram for malignant neoplasm of breast: Secondary | ICD-10-CM

## 2016-01-24 ENCOUNTER — Encounter: Payer: Self-pay | Admitting: Family Medicine

## 2016-01-24 ENCOUNTER — Other Ambulatory Visit: Payer: Self-pay | Admitting: Family Medicine

## 2016-01-24 ENCOUNTER — Ambulatory Visit (INDEPENDENT_AMBULATORY_CARE_PROVIDER_SITE_OTHER): Payer: Medicare HMO | Admitting: Family Medicine

## 2016-01-24 VITALS — BP 130/66 | HR 82 | Temp 98.4°F | Ht 62.5 in | Wt 146.2 lb

## 2016-01-24 DIAGNOSIS — J189 Pneumonia, unspecified organism: Secondary | ICD-10-CM

## 2016-01-24 MED ORDER — AZITHROMYCIN 250 MG PO TABS
ORAL_TABLET | ORAL | Status: DC
Start: 2016-01-24 — End: 2016-01-24

## 2016-01-24 MED ORDER — AZITHROMYCIN 250 MG PO TABS: ORAL_TABLET | ORAL | Status: DC

## 2016-01-24 NOTE — Progress Notes (Signed)
Pre visit review using our clinic review tool, if applicable. No additional management support is needed unless otherwise documented below in the visit note. 

## 2016-01-24 NOTE — Progress Notes (Signed)
Dr. Frederico Hamman T. Anup Brigham, MD, [facility] [place], [address] Phone: [phone] Fax: [phone]  01/24/2016  [name]: [name], MRN: [id number], DOB: November 08, 1940, 75 y.o.  Primary Physician:  Loura Pardon, MD   Chief Complaint  [name] presents with  . Cough    on and off x 6 weeks with chest tightness  . Shortness of Breath   Subjective:   [name] is a 75 y.o. very pleasant female [name] who presents with the following:  Cough for about six weeks, then thought better and kicked up again for more than a week.  A box of coricidin, and cough is so tight that he having some difficulty breathing. She has been coughing on and on intermittently for 6 weeks.  She has some chest tightness, and shortness of breath.  She currently is not running a fever.  She has not had any other issues such as nausea, vomiting, diarrhea.  She has had some occasional nasal stuffiness.  Past Medical History, Surgical History, Social History, Family History, Problem List, Medications, and Allergies have been reviewed and updated if relevant.  [name] Active Problem List   Diagnosis Date Noted  . Right knee pain 11/09/2015  . Left shoulder pain 11/09/2015  . Routine general medical examination at a health care facility 06/11/2015  . Red skin 07/27/2014  . Encounter for Medicare annual wellness exam 11/14/2013  . Dermatitis 11/14/2013  . Family history of colon cancer 11/14/2013  . Colon cancer screening 11/14/2013  . Right hip pain 01/31/2013  . Hypertension 01/27/2011  . Post-menopausal 01/27/2011  . BACK PAIN 03/13/2008  . Hyperlipidemia 12/20/2007  . CONSTIPATION, CHRONIC 12/20/2007  . Disorder of bone and cartilage 12/20/2007  . Hyperglycemia 12/20/2007    Past Medical History  Diagnosis Date  . Osteopenia   . Back pain   . Hyperlipidemia   . Hypertension   . Chronic constipation     Past Surgical History    Procedure Laterality Date  . Cleft palate repair    . Tubal ligation    . Nephrectomy living donor      Left  . Foot neuroma surgery  2/06    Social History   Social History  . Marital Status: Married    Spouse Name: N/A  . Number of Children: N/A  . Years of Education: N/A   Occupational History  . works at a [employer]  . Smoking status: Never Smoker   . Smokeless tobacco: Never Used     Comment: smoked 1 year after high school, never since  . Alcohol Use: No  . Drug Use: No  . Sexual Activity: Not on file   Other Topics Concern  . Not on file   Social History Narrative    Family History  Problem Relation Age of Onset  . Colon cancer Mother   . Diabetes Mother   . Coronary artery disease Mother   . Osteoporosis Mother   . Dementia Mother   . Coronary artery disease Father   . Heart failure Father   . Hypertension Father   . Stomach cancer Neg Hx     Allergies  Allergen Reactions  . Codeine     Medication list reviewed and updated in full in [facility].  ROS: GEN: Acute illness details above GI: Tolerating PO intake GU: maintaining adequate hydration and urination Pulm: + SOB Interactive and getting  along well at home.  Otherwise, ROS is as per the HPI.   Objective:   BP 130/66 mmHg  Pulse 82  Temp(Src) 98.4 F (36.9 C) (Oral)  Ht 5' 2.5" (1.588 m)  Wt 146 lb 4 oz (66.339 kg)  BMI 26.31 kg/m2  SpO2 95%   GEN: A and O x 3. WDWN. NAD.    ENT: Nose clear, ext NML.  No LAD.  No JVD.  TM's clear. Oropharynx clear.  PULM: Normal WOB, no distress. No crackles, wheezes, rhonchi. CV: RRR, no M/G/R, No rubs, No JVD.   EXT: warm and well-perfused, No c/c/e. PSYCH: Pleasant and conversant.    Laboratory and Imaging Data:  Assessment and Plan:   Walking pneumonia  Given the length of illness at 6 weeks, I think the covering for atypical pneumonia is prudent.  Pertussis can also not be excluded.  Follow-up:  No Follow-up on file.  New Prescriptions   AZITHROMYCIN (ZITHROMAX) 250 MG TABLET    2 tabs po on day 1, then 1 tab po for 4 days   Modified Medications   Modified Medication Previous Medication   ATORVASTATIN (LIPITOR) 10 MG TABLET atorvastatin (LIPITOR) 10 MG tablet      TAKE 1 TABLET(10 MG) BY MOUTH DAILY    TAKE 1 TABLET(10 MG) BY MOUTH DAILY   LISINOPRIL (PRINIVIL,ZESTRIL) 10 MG TABLET lisinopril (PRINIVIL,ZESTRIL) 10 MG tablet      TAKE 1 TABLET BY MOUTH DAILY    TAKE 1 TABLET BY MOUTH DAILY   No orders of the defined types were placed in this encounter.    Signed,  Maud Deed. Lenix Benoist, MD   [name]'s Medications  New Prescriptions   AZITHROMYCIN (ZITHROMAX) 250 MG TABLET    2 tabs po on day 1, then 1 tab po for 4 days  Previous Medications   ASPIRIN 81 MG TABLET    Take 81 mg by mouth daily.   BISACODYL (DULCOLAX) 5 MG EC TABLET    Take 5 mg by mouth daily as needed.     GLUCOSAMINE HCL (GLUCOSAMINE PO)    Take 1 tablet by mouth daily.   MULTIPLE VITAMINS-MINERALS (CENTRUM SILVER) TABLET    Take 1 tablet by mouth daily.    Modified Medications   Modified Medication Previous Medication   ATORVASTATIN (LIPITOR) 10 MG TABLET atorvastatin (LIPITOR) 10 MG tablet      TAKE 1 TABLET(10 MG) BY MOUTH DAILY    TAKE 1 TABLET(10 MG) BY MOUTH DAILY   LISINOPRIL (PRINIVIL,ZESTRIL) 10 MG TABLET lisinopril (PRINIVIL,ZESTRIL) 10 MG tablet      TAKE 1 TABLET BY MOUTH DAILY    TAKE 1 TABLET BY MOUTH DAILY  Discontinued Medications   DESONIDE (DESOWEN) 0.05 % CREAM    Apply topically 2 (two) times daily. To affected areas

## 2016-01-28 ENCOUNTER — Ambulatory Visit
Admission: RE | Admit: 2016-01-28 | Discharge: 2016-01-28 | Disposition: A | Discharge status: Home / Self Care | Payer: Medicare HMO | Source: Ambulatory Visit

## 2016-01-28 DIAGNOSIS — Z1231 Encounter for screening mammogram for malignant neoplasm of breast: Secondary | ICD-10-CM

## 2016-02-25 DIAGNOSIS — L821 Other seborrheic keratosis: Secondary | ICD-10-CM | POA: Diagnosis not present

## 2016-02-25 DIAGNOSIS — D225 Melanocytic nevi of trunk: Secondary | ICD-10-CM | POA: Diagnosis not present

## 2016-02-25 DIAGNOSIS — L918 Other hypertrophic disorders of the skin: Secondary | ICD-10-CM | POA: Diagnosis not present

## 2016-02-25 DIAGNOSIS — L814 Other melanin hyperpigmentation: Secondary | ICD-10-CM | POA: Diagnosis not present

## 2016-04-05 DIAGNOSIS — Z0100 Encounter for examination of eyes and vision without abnormal findings: Secondary | ICD-10-CM | POA: Diagnosis not present

## 2016-04-05 DIAGNOSIS — H04123 Dry eye syndrome of bilateral lacrimal glands: Secondary | ICD-10-CM | POA: Diagnosis not present

## 2016-04-05 DIAGNOSIS — H43813 Vitreous degeneration, bilateral: Secondary | ICD-10-CM | POA: Diagnosis not present

## 2016-04-05 DIAGNOSIS — H25813 Combined forms of age-related cataract, bilateral: Secondary | ICD-10-CM | POA: Diagnosis not present

## 2016-07-11 DIAGNOSIS — R69 Illness, unspecified: Secondary | ICD-10-CM | POA: Diagnosis not present

## 2016-07-31 ENCOUNTER — Other Ambulatory Visit: Payer: Self-pay | Admitting: Family Medicine

## 2016-08-01 NOTE — Telephone Encounter (Signed)
Info given to Greer to schedule and med refilled

## 2016-08-01 NOTE — Telephone Encounter (Signed)
No recent/future appts., please advise  

## 2016-08-01 NOTE — Telephone Encounter (Signed)
Please schedule annual exam and AVW if appropriate -winter or spring is fine  Refill until then  Thanks

## 2016-08-09 ENCOUNTER — Telehealth: Payer: Self-pay | Admitting: Family Medicine

## 2016-08-09 NOTE — Telephone Encounter (Signed)
Left message asking pt to call office Pt needs medicare wellness with lisa and cpx with dr tower [name] or spring is ok

## 2016-08-17 NOTE — Telephone Encounter (Signed)
Medicare wellness 4/6 cpx with dr tower 4/13 Pt aware

## 2016-11-03 DIAGNOSIS — E7800 Pure hypercholesterolemia, unspecified: Secondary | ICD-10-CM | POA: Diagnosis not present

## 2016-11-03 DIAGNOSIS — M13861 Other specified arthritis, right knee: Secondary | ICD-10-CM | POA: Diagnosis not present

## 2016-11-03 DIAGNOSIS — M13842 Other specified arthritis, left hand: Secondary | ICD-10-CM | POA: Diagnosis not present

## 2016-11-03 DIAGNOSIS — Z0000 Encounter for general adult medical examination without abnormal findings: Secondary | ICD-10-CM | POA: Diagnosis not present

## 2016-11-03 DIAGNOSIS — Z6826 Body mass index (BMI) 26.0-26.9, adult: Secondary | ICD-10-CM | POA: Diagnosis not present

## 2016-11-03 DIAGNOSIS — I10 Essential (primary) hypertension: Secondary | ICD-10-CM | POA: Diagnosis not present

## 2016-11-10 DIAGNOSIS — R69 Illness, unspecified: Secondary | ICD-10-CM | POA: Diagnosis not present

## 2016-11-24 ENCOUNTER — Other Ambulatory Visit: Payer: Self-pay

## 2016-11-24 MED ORDER — LISINOPRIL 10 MG PO TABS: 10.0000 mg | ORAL_TABLET | Freq: Every day | ORAL | 0 refills | Status: DC

## 2016-11-24 MED ORDER — ATORVASTATIN CALCIUM 10 MG PO TABS: ORAL_TABLET | ORAL | 0 refills | Status: DC

## 2016-11-24 NOTE — Telephone Encounter (Signed)
Pt request refill atorvastatin (last refilled #90 on 08/01/16) and lisinopril ( last refilled # 90 on 08/01/16) pt has CPX scheduled on 01/19/17. Pt will keep that appt and ck with pharmacy for pick up.[facility].

## 2016-12-15 ENCOUNTER — Encounter: Payer: Self-pay | Admitting: Family Medicine

## 2016-12-15 ENCOUNTER — Ambulatory Visit (INDEPENDENT_AMBULATORY_CARE_PROVIDER_SITE_OTHER): Payer: Medicare HMO | Admitting: Family Medicine

## 2016-12-15 VITALS — BP 166/96 | HR 84 | Temp 98.1°F | Resp 18 | Wt 144.2 lb

## 2016-12-15 DIAGNOSIS — J40 Bronchitis, not specified as acute or chronic: Secondary | ICD-10-CM | POA: Diagnosis not present

## 2016-12-15 DIAGNOSIS — R05 Cough: Secondary | ICD-10-CM

## 2016-12-15 DIAGNOSIS — R062 Wheezing: Secondary | ICD-10-CM | POA: Diagnosis not present

## 2016-12-15 DIAGNOSIS — R059 Cough, unspecified: Secondary | ICD-10-CM

## 2016-12-15 MED ORDER — ALBUTEROL SULFATE (2.5 MG/3ML) 0.083% IN NEBU
2.5000 mg | INHALATION_SOLUTION | Freq: Once | RESPIRATORY_TRACT | Status: AC
  Administered 2016-12-15: 2.5 mg via RESPIRATORY_TRACT

## 2016-12-15 MED ORDER — HYDROCODONE-HOMATROPINE 5-1.5 MG/5ML PO SYRP: 5.0000 mL | ORAL_SOLUTION | Freq: Three times a day (TID) | ORAL | 0 refills | Status: DC | PRN

## 2016-12-15 MED ORDER — ALBUTEROL SULFATE HFA 108 (90 BASE) MCG/ACT IN AERS: 2.0000 | INHALATION_SPRAY | RESPIRATORY_TRACT | 1 refills | Status: DC | PRN

## 2016-12-15 MED ORDER — AZITHROMYCIN 250 MG PO TABS: ORAL_TABLET | ORAL | 0 refills | Status: DC

## 2016-12-15 NOTE — Patient Instructions (Addendum)
It was a pleasure to see you today Can continue plain Mucinex Call 911 if you have difficulty breathing Let me or Dr. Glori Bickers know if you are not better with antibiotic and cough medicine or if you are feeling worse  Acute Bronchitis, Adult Acute bronchitis is sudden (acute) swelling of the air tubes (bronchi) in the lungs. Acute bronchitis causes these tubes to fill with mucus, which can make it hard to breathe. It can also cause coughing or wheezing. In adults, acute bronchitis usually goes away within 2 weeks. A cough caused by bronchitis may last up to 3 weeks. Smoking, allergies, and asthma can make the condition worse. Repeated episodes of bronchitis may cause further lung problems, such as chronic obstructive pulmonary disease (COPD). What are the causes? This condition can be caused by germs and by substances that irritate the lungs, including:  Cold and flu viruses. This condition is most often caused by the same virus that causes a cold.  Bacteria.  Exposure to tobacco smoke, dust, fumes, and air pollution. What increases the risk? This condition is more likely to develop in people who:  Have close contact with someone with acute bronchitis.  Are exposed to lung irritants, such as tobacco smoke, dust, fumes, and vapors.  Have a weak immune system.  Have a respiratory condition such as asthma. What are the signs or symptoms? Symptoms of this condition include:  A cough.  Coughing up clear, yellow, or green mucus.  Wheezing.  Chest congestion.  Shortness of breath.  A fever.  Body aches.  Chills.  A sore throat. How is this diagnosed? This condition is usually diagnosed with a physical exam. During the exam, your health care provider may order tests, such as chest X-rays, to rule out other conditions. He or she may also:  Test a sample of your mucus for bacterial infection.  Check the level of oxygen in your blood. This is done to check for pneumonia.  Do a  chest X-ray or lung function testing to rule out pneumonia and other conditions.  Perform blood tests. Your health care provider will also ask about your symptoms and medical history. How is this treated? Most cases of acute bronchitis clear up over time without treatment. Your health care provider may recommend:  Drinking more fluids. Drinking more makes your mucus thinner, which may make it easier to breathe.  Taking a medicine for a fever or cough.  Taking an antibiotic medicine.  Using an inhaler to help improve shortness of breath and to control a cough.  Using a cool mist vaporizer or humidifier to make it easier to breathe. Follow these instructions at home: Medicines   Take over-the-counter and prescription medicines only as told by your health care provider.  If you were prescribed an antibiotic, take it as told by your health care provider. Do not stop taking the antibiotic even if you start to feel better. General instructions   Get plenty of rest.  Drink enough fluids to keep your urine clear or pale yellow.  Avoid smoking and secondhand smoke. Exposure to cigarette smoke or irritating chemicals will make bronchitis worse. If you smoke and you need help quitting, ask your health care provider. Quitting smoking will help your lungs heal faster.  Use an inhaler, cool mist vaporizer, or humidifier as told by your health care provider.  Keep all follow-up visits as told by your health care provider. This is important. How is this prevented? To lower your risk of getting this  condition again:  Wash your hands often with soap and water. If soap and water are not available, use hand sanitizer.  Avoid contact with people who have cold symptoms.  Try not to touch your hands to your mouth, nose, or eyes.  Make sure to get the flu shot every year. Contact a health care provider if:  Your symptoms do not improve in 2 weeks of treatment. Get help right away if:  You  cough up blood.  You have chest pain.  You have severe shortness of breath.  You become dehydrated.  You faint or keep feeling like you are going to faint.  You keep vomiting.  You have a severe headache.  Your fever or chills gets worse. This information is not intended to replace advice given to you by your health care provider. Make sure you discuss any questions you have with your health care provider. Document Released: 11/02/2004 Document Revised: 04/19/2016 Document Reviewed: 03/15/2016 Elsevier Interactive Patient Education  2017 [employer].  How to Use a Metered Dose Inhaler A metered dose inhaler is a handheld device for taking medicine that must be breathed into the lungs (inhaled). The device can be used to deliver a variety of inhaled medicines, including:  Quick relief or rescue medicines, such as bronchodilators.  Controller medicines, such as corticosteroids. The medicine is delivered by pushing down on a metal canister to release a preset amount of spray and medicine. Each device contains the amount of medicine that is needed for a preset number of uses (inhalations). Your health care provider may recommend that you use a spacer with your inhaler to help you take the medicine more effectively. A spacer is a plastic tube with a mouthpiece on one end and an opening that connects to the inhaler on the other end. A spacer holds the medicine in a tube for a short time, which allows you to inhale more medicine. What are the risks? If you do not use your inhaler correctly, medicine might not reach your lungs to help you breathe. Inhaler medicine can cause side effects, such as:  Mouth or throat infection.  Cough.  Hoarseness.  Headache.  Nausea and vomiting.  Lung infection (pneumonia) in people who have a lung condition called COPD. How to use a metered dose inhaler without a spacer 1. Remove the cap from the inhaler. 2. If you are using the inhaler for the  first time, shake it for 5 seconds, turn it away from your face, then release 4 puffs into the air. This is called priming. 3. Shake the inhaler for 5 seconds. 4. Position the inhaler so the top of the canister faces up. 5. Put your index finger on the top of the medicine canister. Support the bottom of the inhaler with your thumb. 6. Breathe out normally and as completely as possible, away from the inhaler. 7. Either place the inhaler between your teeth and close your lips tightly around the mouthpiece, or hold the inhaler 1-2 inches (2.5-5 cm) away from your open mouth. Keep your tongue down out of the way. If you are unsure which technique to use, ask your health care provider. 8. Press the canister down with your index finger to release the medicine, then inhale deeply and slowly through your mouth (not your nose) until your lungs are completely filled. Inhaling should take 4-6 seconds. 9. Hold the medicine in your lungs for 5-10 seconds (10 seconds is best). This helps the medicine get into the small airways of  your lungs. 10. With your lips in a tight circle (pursed), breathe out slowly. 11. Repeat steps 3-10 until you have taken the number of puffs that your health care provider directed. Wait about 1 minute between puffs or as directed. 12. Put the cap on the inhaler. 13. If you are using a steroid inhaler, rinse your mouth with water, gargle, and spit out the water. Do not swallow the water. How to use a metered dose inhaler with a spacer 1. Remove the cap from the inhaler. 2. If you are using the inhaler for the first time, shake it for 5 seconds, turn it away from your face, then release 4 puffs into the air. This is called priming. 3. Shake the inhaler for 5 seconds. 4. Place the open end of the spacer onto the inhaler mouthpiece. 5. Position the inhaler so the top of the canister faces up and the spacer mouthpiece faces you. 6. Put your index finger on the top of the medicine canister.  Support the bottom of the inhaler and the spacer with your thumb. 7. Breathe out normally and as completely as possible, away from the spacer. 8. Place the spacer between your teeth and close your lips tightly around it. Keep your tongue down out of the way. 9. Press the canister down with your index finger to release the medicine, then inhale deeply and slowly through your mouth (not your nose) until your lungs are completely filled. Inhaling should take 4-6 seconds. 10. Hold the medicine in your lungs for 5-10 seconds (10 seconds is best). This helps the medicine get into the small airways of your lungs. 11. With your lips in a tight circle (pursed), breathe out slowly. 12. Repeat steps 3-11 until you have taken the number of puffs that your health care provider directed. Wait about 1 minute between puffs or as directed. 13. Remove the spacer from the inhaler and put the cap on the inhaler. 14. If you are using a steroid inhaler, rinse your mouth with water, gargle, and spit out the water. Do not swallow the water. Follow these instructions at home:  Take your inhaled medicine only as told by your health care provider. Do not use the inhaler more than directed by your health care provider.  Keep all follow-up visits as told by your health care provider. This is important.  If your inhaler has a counter, you can check it to determine how full your inhaler is. If your inhaler does not have a counter, ask your health care provider when you will need to refill your inhaler and write the refill date on a calendar or on your inhaler canister. Note that you cannot know when an inhaler is empty by shaking it.  Follow directions on the package insert for care and cleaning of your inhaler and spacer. Contact a health care provider if:  Symptoms are only partially relieved with your inhaler.  You are having trouble using your inhaler.  You have an increase in phlegm.  You have headaches. Get help  right away if:  You feel little or no relief after using your inhaler.  You have dizziness.  You have a fast heart rate.  You have chills or a fever.  You have night sweats.  There is blood in your phlegm. Summary  A metered dose inhaler is a handheld device for taking medicine that must be breathed into the lungs (inhaled).  The medicine is delivered by pushing down on a metal canister to release  a preset amount of spray and medicine.  Each device contains the amount of medicine that is needed for a preset number of uses (inhalations). This information is not intended to replace advice given to you by your health care provider. Make sure you discuss any questions you have with your health care provider. Document Released: 09/25/2005 Document Revised: 08/15/2016 Document Reviewed: 08/15/2016 Elsevier Interactive Patient Education  2017 [employer].

## 2016-12-15 NOTE — Progress Notes (Signed)
Subjective:    Patient ID: [name], female    DOB: Feb 14, 1941, 76 y.o.   MRN: [id number]  HPI This is a 76 yo female, patient of Dr. Glori Bickers, who presents today for an acute visit. She has had a cough x 1 week. Heats with burning hot water circulator. A little wheezing, cough is rarely productive. Occasional runny nose, no eye drainage/itching, no fever, no sore throat, no ear pain. Chest feels tight, mild SOB, started as a tickle. More cough if she gets overheated. Had similar episode last April, resolved with Azithromycin.  Checks blood pressure at home, never elevated.  Past Medical History:  Diagnosis Date  . Back pain   . Chronic constipation   . Hyperlipidemia   . Hypertension   . Osteopenia    Past Surgical History:  Procedure Laterality Date  . CLEFT PALATE REPAIR    . FOOT NEUROMA SURGERY  2/06  . NEPHRECTOMY LIVING DONOR     Left  . TUBAL LIGATION     Family History  Problem Relation Age of Onset  . Colon cancer Mother   . Diabetes Mother   . Coronary artery disease Mother   . Osteoporosis Mother   . Dementia Mother   . Coronary artery disease Father   . Heart failure Father   . Hypertension Father   . Stomach cancer Neg Hx    Social History  Substance Use Topics  . Smoking status: Never Smoker  . Smokeless tobacco: Never Used     Comment: smoked 1 year after high school, never since  . Alcohol use No      Review of Systems Per HPI    Objective:   Physical Exam  Constitutional: She is oriented to person, place, and time. She appears well-developed and well-nourished. No distress.  HENT:  Head: Normocephalic and atraumatic.  Right Ear: Tympanic membrane, external ear and ear canal normal.  Left Ear: Tympanic membrane, external ear and ear canal normal.  Nose: Nose normal.  Mouth/Throat: Uvula is midline and oropharynx is clear and moist.  Eyes: Conjunctivae are normal.  Cardiovascular: Normal rate, regular rhythm and normal heart sounds.     Pulmonary/Chest: Effort normal and breath sounds normal.  Neurological: She is alert and oriented to person, place, and time.  Skin: Skin is warm and dry. She is not diaphoretic.  Psychiatric: She has a normal mood and affect. Her behavior is normal. Judgment and thought content normal.  Vitals reviewed.     BP (!) 166/96 (BP Location: Right Arm, Patient Position: Sitting, Cuff Size: Normal)   Pulse 84   Temp 98.1 F (36.7 C) (Oral)   Resp 18   Wt 144 lb 3.2 oz (65.4 kg)   SpO2 97%   BMI 25.95 kg/m  BP Readings from Last 3 Encounters:  12/15/16 (!) 166/96  01/24/16 130/66  06/11/15 124/72   Albuterol neb given in office, patient reports decreased feeling of tightness, cough looser    Assessment & Plan:  1. Cough - albuterol (PROVENTIL) (2.5 MG/3ML) 0.083% nebulizer solution 2.5 mg; Take 3 mLs (2.5 mg total) by nebulization once. - HYDROcodone-homatropine (HYCODAN) 5-1.5 MG/5ML syrup; Take 5 mLs by mouth every 8 (eight) hours as needed for cough.  Dispense: 120 mL; Refill: 0 - albuterol (PROVENTIL HFA;VENTOLIN HFA) 108 (90 Base) MCG/ACT inhaler; Inhale 2 puffs into the lungs every 4 (four) hours as needed for wheezing or shortness of breath (cough, shortness of breath or wheezing.).  Dispense: 1 Inhaler; Refill:  1  2. Wheeze - albuterol (PROVENTIL) (2.5 MG/3ML) 0.083% nebulizer solution 2.5 mg; Take 3 mLs (2.5 mg total) by nebulization once. - albuterol (PROVENTIL HFA;VENTOLIN HFA) 108 (90 Base) MCG/ACT inhaler; Inhale 2 puffs into the lungs every 4 (four) hours as needed for wheezing or shortness of breath (cough, shortness of breath or wheezing.).  Dispense: 1 Inhaler; Refill: 1  3. Bronchitis -Provided written and verbal information regarding diagnosis and treatment. - RTC/ER precautions reviewed - albuterol (PROVENTIL HFA;VENTOLIN HFA) 108 (90 Base) MCG/ACT inhaler; Inhale 2 puffs into the lungs every 4 (four) hours as needed for wheezing or shortness of breath (cough,  shortness of breath or wheezing.).  Dispense: 1 Inhaler; Refill: 1 - azithromycin (ZITHROMAX) 250 MG tablet; 2 tabs po on day 1, then 1 tab po for 4 days  Dispense: 6 tablet; Refill: 0  - follow up as scheduled next month with Dr. Danielle Dess, FNP-BC  [facility]  12/15/2016 1:46 PM

## 2016-12-15 NOTE — Progress Notes (Signed)
Pre visit review using our clinic review tool, if applicable. No additional management support is needed unless otherwise documented below in the visit note. 

## 2017-01-07 ENCOUNTER — Telehealth: Payer: Self-pay | Admitting: Family Medicine

## 2017-01-07 DIAGNOSIS — Z0000 Encounter for general adult medical examination without abnormal findings: Secondary | ICD-10-CM

## 2017-01-07 DIAGNOSIS — R739 Hyperglycemia, unspecified: Secondary | ICD-10-CM

## 2017-01-07 NOTE — Telephone Encounter (Signed)
labs

## 2017-01-12 ENCOUNTER — Ambulatory Visit (INDEPENDENT_AMBULATORY_CARE_PROVIDER_SITE_OTHER): Payer: Medicare HMO

## 2017-01-12 VITALS — BP 120/70 | HR 74 | Temp 98.6°F | Ht 62.5 in | Wt 141.0 lb

## 2017-01-12 DIAGNOSIS — Z0000 Encounter for general adult medical examination without abnormal findings: Secondary | ICD-10-CM

## 2017-01-12 DIAGNOSIS — R739 Hyperglycemia, unspecified: Secondary | ICD-10-CM

## 2017-01-12 LAB — CBC WITH DIFFERENTIAL/PLATELET
Basophils Absolute: 0.1 10*3/uL (ref 0.0–0.1)
Basophils Relative: 0.8 % (ref 0.0–3.0)
Eosinophils Absolute: 0.3 10*3/uL (ref 0.0–0.7)
Eosinophils Relative: 4.6 % (ref 0.0–5.0)
HCT: 40.8 % (ref 36.0–46.0)
Hemoglobin: 13.8 g/dL (ref 12.0–15.0)
Lymphocytes Relative: 25.8 % (ref 12.0–46.0)
Lymphs Abs: 1.7 10*3/uL (ref 0.7–4.0)
MCHC: 33.9 g/dL (ref 30.0–36.0)
MCV: 91.6 fl (ref 78.0–100.0)
Monocytes Absolute: 0.4 10*3/uL (ref 0.1–1.0)
Monocytes Relative: 5.9 % (ref 3.0–12.0)
Neutro Abs: 4.2 10*3/uL (ref 1.4–7.7)
Neutrophils Relative %: 62.9 % (ref 43.0–77.0)
Platelets: 225 10*3/uL (ref 150.0–400.0)
RBC: 4.45 Mil/uL (ref 3.87–5.11)
RDW: 12.6 % (ref 11.5–15.5)
WBC: 6.7 10*3/uL (ref 4.0–10.5)

## 2017-01-12 LAB — COMPREHENSIVE METABOLIC PANEL
ALT: 14 U/L (ref 0–35)
AST: 19 U/L (ref 0–37)
Albumin: 4.4 g/dL (ref 3.5–5.2)
Alkaline Phosphatase: 94 U/L (ref 39–117)
BUN: 22 mg/dL (ref 6–23)
CO2: 32 mEq/L (ref 19–32)
Calcium: 9.9 mg/dL (ref 8.4–10.5)
Chloride: 103 mEq/L (ref 96–112)
Creatinine, Ser: 1.18 mg/dL (ref 0.40–1.20)
GFR: 47.53 mL/min — ABNORMAL LOW (ref >60.00)
Glucose, Bld: 103 mg/dL — ABNORMAL HIGH (ref 70–99)
Potassium: 4.7 mEq/L (ref 3.5–5.1)
Sodium: 139 mEq/L (ref 135–145)
Total Bilirubin: 0.4 mg/dL (ref 0.2–1.2)
Total Protein: 7.4 g/dL (ref 6.0–8.3)

## 2017-01-12 LAB — LIPID PANEL
Cholesterol: 185 mg/dL (ref 0–200)
HDL: 46.6 mg/dL (ref >39.00)
LDL Cholesterol: 111 mg/dL — ABNORMAL HIGH (ref 0–99)
NonHDL: 137.92
Total CHOL/HDL Ratio: 4
Triglycerides: 137 mg/dL (ref 0.0–149.0)
VLDL: 27.4 mg/dL (ref 0.0–40.0)

## 2017-01-12 LAB — TSH: TSH: 2.4 u[IU]/mL (ref 0.35–4.50)

## 2017-01-12 LAB — HEMOGLOBIN A1C: Hgb A1c MFr Bld: 6.1 % (ref 4.6–6.5)

## 2017-01-12 NOTE — Progress Notes (Signed)
Subjective:   [name] is a 76 y.o. female who presents for Medicare Annual (Subsequent) preventive examination.  Review of Systems:  N/A Cardiac Risk Factors include: advanced age (>43men, >57 women);dyslipidemia;hypertension     Objective:     Vitals: BP 120/70 (BP Location: Right Arm, Patient Position: Sitting, Cuff Size: Normal)   Pulse 74   Temp 98.6 F (37 C) (Oral)   Ht 5' 2.5" (1.588 m) Comment: no shoes  Wt 141 lb (64 kg)   SpO2 99%   BMI 25.38 kg/m   Body mass index is 25.38 kg/m.   Tobacco History  Smoking Status  . Never Smoker  Smokeless Tobacco  . Never Used    Comment: smoked 1 year after high school, never since     Counseling given: No   Past Medical History:  Diagnosis Date  . Back pain   . Chronic constipation   . Hyperlipidemia   . Hypertension   . Osteopenia    Past Surgical History:  Procedure Laterality Date  . CLEFT PALATE REPAIR    . FOOT NEUROMA SURGERY  2/06  . NEPHRECTOMY LIVING DONOR     Left  . TUBAL LIGATION     Family History  Problem Relation Age of Onset  . Colon cancer Mother   . Diabetes Mother   . Coronary artery disease Mother   . Osteoporosis Mother   . Dementia Mother   . Coronary artery disease Father   . Heart failure Father   . Hypertension Father   . Stomach cancer Neg Hx    History  Sexual Activity  . Sexual activity: Not on file    Outpatient Encounter Prescriptions as of 01/12/2017  Medication Sig  . albuterol (PROVENTIL HFA;VENTOLIN HFA) 108 (90 Base) MCG/ACT inhaler Inhale 2 puffs into the lungs every 4 (four) hours as needed for wheezing or shortness of breath (cough, shortness of breath or wheezing.).  Marland Kitchen aspirin 81 MG tablet Take 81 mg by mouth daily.  Marland Kitchen atorvastatin (LIPITOR) 10 MG tablet TAKE 1 TABLET(10 MG) BY MOUTH DAILY  . bisacodyl (DULCOLAX) 5 MG EC tablet Take 5 mg by mouth daily as needed.    . Cholecalciferol (VITAMIN D3 PO) Take 1 capsule by mouth daily.  . Glucosamine HCl  (GLUCOSAMINE PO) Take 1 tablet by mouth daily.  Marland Kitchen lisinopril (PRINIVIL,ZESTRIL) 10 MG tablet Take 1 tablet (10 mg total) by mouth daily.  . Multiple Vitamins-Minerals (CENTRUM SILVER) tablet Take 1 tablet by mouth daily.    . [DISCONTINUED] azithromycin (ZITHROMAX) 250 MG tablet 2 tabs po on day 1, then 1 tab po for 4 days  . [DISCONTINUED] FLUZONE HIGH-DOSE 0.5 ML SUSY TO BE ADMINISTERED BY PHARMACIST FOR IMMUNIZATION  . [DISCONTINUED] HYDROcodone-homatropine (HYCODAN) 5-1.5 MG/5ML syrup Take 5 mLs by mouth every 8 (eight) hours as needed for cough.   No facility-administered encounter medications on file as of 01/12/2017.     Activities of Daily Living In your present state of health, do you have any difficulty performing the following activities: 01/12/2017  Hearing? Y  Vision? N  Difficulty concentrating or making decisions? Y  Walking or climbing stairs? Y  Dressing or bathing? N  Doing errands, shopping? N  Preparing Food and eating ? N  Using the Toilet? N  In the past six months, have you accidently leaked urine? Y  Do you have problems with loss of bowel control? N  Managing your Medications? N  Managing your Finances? N  Housekeeping or  managing your Housekeeping? N  Some recent data might be hidden    Patient Care Team: Abner Greenspan, MD as PCP - General Shon Hough, MD as Consulting Physician (Ophthalmology)    Assessment:     Hearing Screening   125Hz  250Hz  500Hz  1000Hz  2000Hz  3000Hz  4000Hz  6000Hz  8000Hz   Right ear:   0 0 40  0    Left ear:   0 0 0  0    Vision Screening Comments: Last vision exam in 2017 with Dr. Juanito Doom   Exercise Activities and Dietary recommendations    Goals    . Increase physical activity          Starting 01/12/2017, I will attempt to walk at least 1 mile daily.       Fall Risk Fall Risk  01/12/2017 06/11/2015 11/14/2013  Falls in the past year? No No No   Depression Screen PHQ 2/9 Scores 01/12/2017 06/11/2015 11/14/2013  PHQ  - 2 Score 0 0 0     Cognitive Function MMSE - Mini Mental State Exam 01/12/2017  Orientation to time 5  Orientation to Place 5  Registration 3  Attention/ Calculation 0  Recall 2  Recall-comments pt was unable to recall 1 of 3 words  Language- name 2 objects 0  Language- repeat 1  Language- follow 3 step command 3  Language- read & follow direction 0  Write a sentence 0  Copy design 0  Total score 19     PLEASE NOTE: A Mini-Cog screen was completed. Maximum score is 20. A value of 0 denotes this part of Folstein MMSE was not completed or the patient failed this part of the Mini-Cog screening.   Mini-Cog Screening Orientation to Time - Max 5 pts Orientation to Place - Max 5 pts Registration - Max 3 pts Recall - Max 3 pts Language Repeat - Max 1 pts Language Follow 3 Step Command - Max 3 pts     Immunization History  Administered Date(s) Administered  . Influenza Split 09/20/2012  . Influenza,inj,Quad PF,36+ Mos 06/11/2015  . Influenza-Unspecified 07/09/2016  . Pneumococcal Conjugate-13 06/11/2015  . Pneumococcal Polysaccharide-23 11/14/2013  . Td 03/31/2002  . Tdap 01/27/2011  . Zoster 11/11/2011   Screening Tests Health Maintenance  Topic Date Due  . MAMMOGRAM  01/27/2017  . INFLUENZA VACCINE  05/09/2017  . TETANUS/TDAP  01/26/2021  . COLONOSCOPY  01/03/2024  . DEXA SCAN  Completed  . PNA vac Low Risk Adult  Completed      Plan:     I have personally reviewed and addressed the Medicare Annual Wellness questionnaire and have noted the following in the patient's chart:  A. Medical and social history B. Use of alcohol, tobacco or illicit drugs  C. Current medications and supplements D. Functional ability and status E.  Nutritional status F.  Physical activity G. Advance directives H. List of other physicians I.  Hospitalizations, surgeries, and ER visits in previous 12 months J.  [place] to include hearing, vision, cognitive,  depression L. Referrals and appointments - none  In addition, I have reviewed and discussed with patient certain preventive protocols, quality metrics, and best practice recommendations. A written personalized care plan for preventive services as well as general preventive health recommendations were provided to patient.  See attached scanned questionnaire for additional information.   Signed,   Lindell Noe, MHA, BS, LPN Health Coach

## 2017-01-12 NOTE — Progress Notes (Signed)
Pre visit review using our clinic review tool, if applicable. No additional management support is needed unless otherwise documented below in the visit note. 

## 2017-01-12 NOTE — Progress Notes (Signed)
I reviewed health advisor's note, was available for consultation, and agree with documentation and plan.  

## 2017-01-12 NOTE — Progress Notes (Signed)
PCP notes:   Health maintenance:  Mammogram - addressed; pt encouraged to contact facility to schedule appt  Abnormal screenings:   Hearing - failed Mini-Cog score: 19/20  Patient concerns:   Patient reports a cough that has persisted for more than 3 weeks. Patient received medical treatment but was unable to tolerate Hycodan. Patient was encouraged to increase intake of water and to discuss ACE inhibitor with PCP at next appt.   Nurse concerns:  None  Next PCP appt:   4/13 @ 1030

## 2017-01-12 NOTE — Patient Instructions (Signed)
Ms. Youkhana , Thank you for taking time to come for your Medicare Wellness Visit. I appreciate your ongoing commitment to your health goals. Please review the following plan we discussed and let me know if I can assist you in the future.   These are the goals we discussed: Goals    . Increase physical activity          Starting 01/12/2017, I will attempt to walk at least 1 mile daily.        This is a list of the screening recommended for you and due dates:  Health Maintenance  Topic Date Due  . Mammogram  01/27/2017  . Flu Shot  05/09/2017  . Tetanus Vaccine  01/26/2021  . Colon Cancer Screening  01/03/2024  . DEXA scan (bone density measurement)  Completed  . Pneumonia vaccines  Completed   Preventive Care for Adults  A healthy lifestyle and preventive care can promote health and wellness. Preventive health guidelines for adults include the following key practices.  . A routine yearly physical is a good way to check with your health care provider about your health and preventive screening. It is a chance to share any concerns and updates on your health and to receive a thorough exam.  . Visit your dentist for a routine exam and preventive care every 6 months. Brush your teeth twice a day and floss once a day. Good oral hygiene prevents tooth decay and gum disease.  . The frequency of eye exams is based on your age, health, family medical history, use  of contact lenses, and other factors. Follow your health care provider's ecommendations for frequency of eye exams.  . Eat a healthy diet. Foods like vegetables, fruits, whole grains, low-fat dairy products, and lean protein foods contain the nutrients you need without too many calories. Decrease your intake of foods high in solid fats, added sugars, and salt. Eat the right amount of calories for you. Get information about a proper diet from your health care provider, if necessary.  . Regular physical exercise is one of the most  important things you can do for your health. Most adults should get at least 150 minutes of moderate-intensity exercise (any activity that increases your heart rate and causes you to sweat) each week. In addition, most adults need muscle-strengthening exercises on 2 or more days a week.  Silver Sneakers may be a benefit available to you. To determine eligibility, you may visit the website: www.silversneakers.com or contact program at (209)299-8651 Mon-Fri between 8AM-8PM.   . Maintain a healthy weight. The body mass index (BMI) is a screening tool to identify possible weight problems. It provides an estimate of body fat based on height and weight. Your health care provider can find your BMI and can help you achieve or maintain a healthy weight.   For adults 20 years and older: ? A BMI below 18.5 is considered underweight. ? A BMI of 18.5 to 24.9 is normal. ? A BMI of 25 to 29.9 is considered overweight. ? A BMI of 30 and above is considered obese.   . Maintain normal blood lipids and cholesterol levels by exercising and minimizing your intake of saturated fat. Eat a balanced diet with plenty of fruit and vegetables. Blood tests for lipids and cholesterol should begin at age 41 and be repeated every 5 years. If your lipid or cholesterol levels are high, you are over 50, or you are at high risk for heart disease, you may need your  cholesterol levels checked more frequently. Ongoing high lipid and cholesterol levels should be treated with medicines if diet and exercise are not working.  . If you smoke, find out from your health care provider how to quit. If you do not use tobacco, please do not start.  . If you choose to drink alcohol, please do not consume more than 2 drinks per day. One drink is considered to be 12 ounces (355 mL) of beer, 5 ounces (148 mL) of wine, or 1.5 ounces (44 mL) of liquor.  . If you are 61-52 years old, ask your health care provider if you should take aspirin to prevent  strokes.  . Use sunscreen. Apply sunscreen liberally and repeatedly throughout the day. You should seek shade when your shadow is shorter than you. Protect yourself by wearing long sleeves, pants, a wide-brimmed hat, and sunglasses year round, whenever you are outdoors.  . Once a month, do a whole body skin exam, using a mirror to look at the skin on your back. Tell your health care provider of new moles, moles that have irregular borders, moles that are larger than a pencil eraser, or moles that have changed in shape or color.

## 2017-01-19 ENCOUNTER — Ambulatory Visit (INDEPENDENT_AMBULATORY_CARE_PROVIDER_SITE_OTHER): Payer: Medicare HMO | Admitting: Family Medicine

## 2017-01-19 ENCOUNTER — Encounter: Payer: Self-pay | Admitting: Family Medicine

## 2017-01-19 VITALS — BP 120/72 | HR 91 | Temp 98.7°F | Ht 62.5 in | Wt 142.8 lb

## 2017-01-19 DIAGNOSIS — Z800 Family history of malignant neoplasm of digestive organs: Secondary | ICD-10-CM

## 2017-01-19 DIAGNOSIS — Z0000 Encounter for general adult medical examination without abnormal findings: Secondary | ICD-10-CM

## 2017-01-19 DIAGNOSIS — Z1231 Encounter for screening mammogram for malignant neoplasm of breast: Secondary | ICD-10-CM | POA: Insufficient documentation

## 2017-01-19 DIAGNOSIS — R739 Hyperglycemia, unspecified: Secondary | ICD-10-CM | POA: Diagnosis not present

## 2017-01-19 DIAGNOSIS — E7800 Pure hypercholesterolemia, unspecified: Secondary | ICD-10-CM | POA: Diagnosis not present

## 2017-01-19 DIAGNOSIS — E2839 Other primary ovarian failure: Secondary | ICD-10-CM | POA: Diagnosis not present

## 2017-01-19 DIAGNOSIS — I10 Essential (primary) hypertension: Secondary | ICD-10-CM

## 2017-01-19 DIAGNOSIS — Z1211 Encounter for screening for malignant neoplasm of colon: Secondary | ICD-10-CM

## 2017-01-19 MED ORDER — ATORVASTATIN CALCIUM 10 MG PO TABS: ORAL_TABLET | ORAL | 3 refills | Status: DC

## 2017-01-19 MED ORDER — LISINOPRIL 10 MG PO TABS: 10.0000 mg | ORAL_TABLET | Freq: Every day | ORAL | 3 refills | Status: DC

## 2017-01-19 NOTE — Progress Notes (Signed)
Pre visit review using our clinic review tool, if applicable. No additional management support is needed unless otherwise documented below in the visit note. 

## 2017-01-19 NOTE — Progress Notes (Signed)
Subjective:    Patient ID: [name], female    DOB: 18-Dec-1940, 76 y.o.   MRN: [id number]  HPI Here for health maintenance exam and to review chronic medical problems    Feeling fine overall  Was sick for 3 weeks with bronchitis - and saw NP Carlean Purl  Could not take the cough med  Continues to have a little cough   Wt Readings from Last 3 Encounters:  01/19/17 142 lb 12 oz (64.8 kg)  01/12/17 141 lb (64 kg)  12/15/16 144 lb 3.2 oz (65.4 kg)  stable  Eats healthy  Exercise is a challenge - wants to make a goal of walking a mile per day  Not very motivated   (also has some walking at work) and takes the stairs  bmi is 25.6  Had amw 4/6 Failed hearing screen - she notices some difficulty with soft speakers , not ready for a hearing aide yet  Mini cog 19/20- unable to recall 1 word- she watches her memory status very closely    Mammogram is due this mo - she does not have it scheduled yet-wants [place] to do it  Self breast exam-no lumps/changes   Colonoscopy 3/15 - no recall due to age  Mother had colon cancer  No symptoms   dexa 2/15-stable osteopenia  No falls or fractures Taking ca and D  Walking for exercise  Wants to schedule dexa   Zoster vaccine 2/13  bp is stable today  No cp or palpitations or headaches or edema  No side effects to medicines  BP Readings from Last 3 Encounters:  01/19/17 120/72  01/12/17 120/70  12/15/16 (!) 166/96     Hx of hyperlipidemia Lab Results  Component Value Date   CHOL 185 01/12/2017   CHOL 158 05/28/2015   CHOL 156 11/07/2013   Lab Results  Component Value Date   HDL 46.60 01/12/2017   HDL 38.20 (L) 05/28/2015   HDL 42.80 11/07/2013   Lab Results  Component Value Date   LDLCALC 111 (H) 01/12/2017   [place] 93 05/28/2015   LDLCALC 81 11/07/2013   Lab Results  Component Value Date   TRIG 137.0 01/12/2017   TRIG 134.0 05/28/2015   TRIG 161.0 (H) 11/07/2013   Lab Results  Component Value Date   CHOLHDL 4  01/12/2017   CHOLHDL 4 05/28/2015   CHOLHDL 4 11/07/2013   No results found for: LDLDIRECT On atorvastatin and diet  HDL is up  LDL is also up  No missed doses   Hx of hyperglycemia Lab Results  Component Value Date   HGBA1C 6.1 01/12/2017   This is up slt was 5.9  Lab Results  Component Value Date   WBC 6.7 01/12/2017   HGB 13.8 01/12/2017   HCT 40.8 01/12/2017   MCV 91.6 01/12/2017   PLT 225.0 01/12/2017     Chemistry      Component Value Date/Time   NA 139 01/12/2017 0828   K 4.7 01/12/2017 0828   CL 103 01/12/2017 0828   CO2 32 01/12/2017 0828   BUN 22 01/12/2017 0828   CREATININE 1.18 01/12/2017 0828      Component Value Date/Time   CALCIUM 9.9 01/12/2017 0828   ALKPHOS 94 01/12/2017 0828   AST 19 01/12/2017 0828   ALT 14 01/12/2017 0828   BILITOT 0.4 01/12/2017 0828      Lab Results  Component Value Date   TSH 2.40 01/12/2017     Review of  Systems    Review of Systems  Constitutional: Negative for fever, appetite change, fatigue and unexpected weight change.  Eyes: Negative for pain and visual disturbance.  Respiratory: Negative for cough and shortness of breath.   Cardiovascular: Negative for cp or palpitations    Gastrointestinal: Negative for nausea, diarrhea and constipation.  Genitourinary: Negative for urgency and frequency.  Skin: Negative for pallor or rash   MSK pos for "bone spur" in her hand - bothers her a little  Neurological: Negative for weakness, light-headedness, numbness and headaches.  Hematological: Negative for adenopathy. Does not bruise/bleed easily.  Psychiatric/Behavioral: Negative for dysphoric mood. The patient is not nervous/anxious.      Objective:   Physical Exam  Constitutional: She appears well-developed and well-nourished. No distress.  Well appearing   HENT:  Head: Normocephalic and atraumatic.  Right Ear: External ear normal.  Left Ear: External ear normal.  Mouth/Throat: Oropharynx is clear and moist.    Eyes: Conjunctivae and EOM are normal. Pupils are equal, round, and reactive to light. No scleral icterus.  Neck: Normal range of motion. Neck supple. No JVD present. Carotid bruit is not present. No thyromegaly present.  Cardiovascular: Normal rate, regular rhythm, normal heart sounds and intact distal pulses.  Exam reveals no gallop.   Pulmonary/Chest: Effort normal and breath sounds normal. No respiratory distress. She has no wheezes. She exhibits no tenderness.  Abdominal: Soft. Bowel sounds are normal. She exhibits no distension, no abdominal bruit and no mass. There is no tenderness.  Genitourinary: No breast swelling, tenderness, discharge or bleeding.  Genitourinary Comments: Breast exam: No mass, nodules, thickening, tenderness, bulging, retraction, inflamation, nipple discharge or skin changes noted.  No axillary or clavicular LA.      Musculoskeletal: Normal range of motion. She exhibits no edema or tenderness.  No kyphosis   Lymphadenopathy:    She has no cervical adenopathy.  Neurological: She is alert. She has normal reflexes. No cranial nerve deficit. She exhibits normal muscle tone. Coordination normal.  Skin: Skin is warm and dry. No rash noted. No erythema. No pallor.  Psychiatric: She has a normal mood and affect.          Assessment & Plan:   Problem List Items Addressed This Visit      Cardiovascular and Mediastinum   Hypertension - Primary    bp in fair control at this time  BP Readings from Last 1 Encounters:  01/19/17 120/72   No changes needed Disc lifstyle change with low sodium diet and exercise  Labs reviewed       Relevant Medications   atorvastatin (LIPITOR) 10 MG tablet   lisinopril (PRINIVIL,ZESTRIL) 10 MG tablet     Other   Colon cancer screening    Disc cologuard program for screening in the future  Has family hx       Estrogen deficiency   Relevant Orders   DG Bone Density   Family history of colon cancer    Will plan cologuard in  2020      Hyperglycemia    Lab Results  Component Value Date   HGBA1C 6.1 01/12/2017   disc imp of low glycemic diet and wt loss to prevent DM2       Hyperlipidemia    Disc goals for lipids and reasons to control them Rev labs with pt Rev low sat fat diet in detail HDL and LDL up on atorvastatin and diet      Relevant Medications   atorvastatin (LIPITOR) 10  MG tablet   lisinopril (PRINIVIL,ZESTRIL) 10 MG tablet   Routine general medical examination at a health care facility    Reviewed health habits including diet and exercise and skin cancer prevention Reviewed appropriate screening tests for age  Also reviewed health mt list, fam hx and immunization status , as well as social and family history   AMW rev-she declines hearing aides  Disc imp of active brain/socialization for memory  Mammogram and dexa ordered       Screening mammogram, encounter for    Scheduled annual screening mammogram Nl breast exam today  Encouraged monthly self exams        Relevant Orders   MM DIGITAL SCREENING BILATERAL

## 2017-01-19 NOTE — Patient Instructions (Addendum)
Stop at check out for referral for mammogram and bone density  You need a new mattress for your back-please look into that   For cholesterol : Avoid red meat/ fried foods/ egg yolks/ fatty breakfast meats/ butter, cheese and high fat dairy/ and shellfish     For hyperglycemia - also watch carbs and sugar  Get you carbs from fruits and vegetables (instead of bread and crackers and pasta and rice and sweets)   For kidney health aim for 64 oz of water per day

## 2017-01-21 NOTE — Assessment & Plan Note (Signed)
Lab Results  Component Value Date   HGBA1C 6.1 01/12/2017   disc imp of low glycemic diet and wt loss to prevent DM2

## 2017-01-21 NOTE — Assessment & Plan Note (Signed)
Disc goals for lipids and reasons to control them Rev labs with pt Rev low sat fat diet in detail HDL and LDL up on atorvastatin and diet

## 2017-01-21 NOTE — Assessment & Plan Note (Signed)
Scheduled annual screening mammogram Nl breast exam today  Encouraged monthly self exams   

## 2017-01-21 NOTE — Assessment & Plan Note (Signed)
Disc cologuard program for screening in the future  Has family hx

## 2017-01-21 NOTE — Assessment & Plan Note (Signed)
Reviewed health habits including diet and exercise and skin cancer prevention Reviewed appropriate screening tests for age  Also reviewed health mt list, fam hx and immunization status , as well as social and family history   AMW rev-she declines hearing aides  Disc imp of active brain/socialization for memory  Mammogram and dexa ordered

## 2017-01-21 NOTE — Assessment & Plan Note (Signed)
bp in fair control at this time  BP Readings from Last 1 Encounters:  01/19/17 120/72   No changes needed Disc lifstyle change with low sodium diet and exercise  Labs reviewed

## 2017-01-21 NOTE — Assessment & Plan Note (Signed)
Will plan cologuard in 2020

## 2017-02-26 ENCOUNTER — Ambulatory Visit
Admission: RE | Admit: 2017-02-26 | Discharge: 2017-02-26 | Disposition: A | Discharge status: Home / Self Care | Payer: Medicare HMO | Source: Ambulatory Visit | Attending: Family Medicine | Admitting: Family Medicine

## 2017-02-26 DIAGNOSIS — M8589 Other specified disorders of bone density and structure, multiple sites: Secondary | ICD-10-CM | POA: Diagnosis not present

## 2017-02-26 DIAGNOSIS — Z1231 Encounter for screening mammogram for malignant neoplasm of breast: Secondary | ICD-10-CM | POA: Diagnosis not present

## 2017-02-26 DIAGNOSIS — E2839 Other primary ovarian failure: Secondary | ICD-10-CM

## 2017-04-06 DIAGNOSIS — H25813 Combined forms of age-related cataract, bilateral: Secondary | ICD-10-CM | POA: Diagnosis not present

## 2017-04-06 DIAGNOSIS — H43813 Vitreous degeneration, bilateral: Secondary | ICD-10-CM | POA: Diagnosis not present

## 2017-04-06 DIAGNOSIS — H35371 Puckering of macula, right eye: Secondary | ICD-10-CM | POA: Diagnosis not present

## 2017-04-06 DIAGNOSIS — H5213 Myopia, bilateral: Secondary | ICD-10-CM | POA: Diagnosis not present

## 2017-06-28 DIAGNOSIS — L821 Other seborrheic keratosis: Secondary | ICD-10-CM | POA: Diagnosis not present

## 2017-06-28 DIAGNOSIS — Z419 Encounter for procedure for purposes other than remedying health state, unspecified: Secondary | ICD-10-CM | POA: Diagnosis not present

## 2017-10-30 DIAGNOSIS — R69 Illness, unspecified: Secondary | ICD-10-CM | POA: Diagnosis not present

## 2017-11-16 DIAGNOSIS — Z833 Family history of diabetes mellitus: Secondary | ICD-10-CM | POA: Diagnosis not present

## 2017-11-16 DIAGNOSIS — Z823 Family history of stroke: Secondary | ICD-10-CM | POA: Diagnosis not present

## 2017-11-16 DIAGNOSIS — Z8249 Family history of ischemic heart disease and other diseases of the circulatory system: Secondary | ICD-10-CM | POA: Diagnosis not present

## 2017-11-16 DIAGNOSIS — I10 Essential (primary) hypertension: Secondary | ICD-10-CM | POA: Diagnosis not present

## 2017-11-16 DIAGNOSIS — E785 Hyperlipidemia, unspecified: Secondary | ICD-10-CM | POA: Diagnosis not present

## 2017-11-16 DIAGNOSIS — Z809 Family history of malignant neoplasm, unspecified: Secondary | ICD-10-CM | POA: Diagnosis not present

## 2017-11-16 DIAGNOSIS — Z7982 Long term (current) use of aspirin: Secondary | ICD-10-CM | POA: Diagnosis not present

## 2017-12-20 ENCOUNTER — Ambulatory Visit (INDEPENDENT_AMBULATORY_CARE_PROVIDER_SITE_OTHER): Payer: Medicare HMO | Admitting: Internal Medicine

## 2017-12-20 ENCOUNTER — Encounter: Payer: Self-pay | Admitting: Internal Medicine

## 2017-12-20 VITALS — BP 122/84 | HR 81 | Temp 98.1°F | Wt 141.0 lb

## 2017-12-20 DIAGNOSIS — J019 Acute sinusitis, unspecified: Secondary | ICD-10-CM | POA: Diagnosis not present

## 2017-12-20 DIAGNOSIS — B9689 Other specified bacterial agents as the cause of diseases classified elsewhere: Secondary | ICD-10-CM

## 2017-12-20 MED ORDER — AZITHROMYCIN 250 MG PO TABS: ORAL_TABLET | ORAL | 0 refills | Status: DC

## 2017-12-20 NOTE — Progress Notes (Signed)
HPI  Pt presents to the clinic today with c/o headache, nasal congestion and facial pressure. She reports this started 2 weeks ago. She describes the headache as pressure. She is blowing blood tinged yellow mucous out of her nose. She denies fever, chills or body aches. She has tried Tylenol Cold and Sinus, Mucinex and Benadryl with some relief. She has had sick contacts.  Review of Systems     Past Medical History:  Diagnosis Date  . Back pain   . Chronic constipation   . Hyperlipidemia   . Hypertension   . Osteopenia     Family History  Problem Relation Age of Onset  . Colon cancer Mother   . Diabetes Mother   . Coronary artery disease Mother   . Osteoporosis Mother   . Dementia Mother   . Coronary artery disease Father   . Heart failure Father   . Hypertension Father   . Stomach cancer Neg Hx     Social History   Socioeconomic History  . Marital status: Married    Spouse name: Not on file  . Number of children: Not on file  . Years of education: Not on file  . Highest education level: Not on file  Social Needs  . Financial resource strain: Not on file  . Food insecurity - worry: Not on file  . Food insecurity - inability: Not on file  . Transportation needs - medical: Not on file  . Transportation needs - non-medical: Not on file  Occupational History  . Occupation: works at [employer]  . Smoking status: Never Smoker  . Smokeless tobacco: Never Used  . Tobacco comment: smoked 1 year after high school, never since  Substance and Sexual Activity  . Alcohol use: No    Alcohol/week: 0.0 oz  . Drug use: No  . Sexual activity: Not on file  Other Topics Concern  . Not on file  Social History Narrative  . Not on file    Allergies  Allergen Reactions  . Codeine   . Hydrocodone-Homatropine     Caused flu-like symptoms     Constitutional: Positive headache. Denies fatigue, fever or abrupt weight changes.  HEENT:  Positive facial pain, nasal  congestion. Denies eye redness, ear pain, ringing in the ears, wax buildup, runny nose or sore throat. Respiratory: Denies cough, difficulty breathing or shortness of breath.  Cardiovascular: Denies chest pain, chest tightness, palpitations or swelling in the hands or feet.   No other specific complaints in a complete review of systems (except as listed in HPI above).  Objective:   BP 122/84   Pulse 81   Temp 98.1 F (36.7 C) (Oral)   Wt 141 lb (64 kg)   SpO2 98%   BMI 25.38 kg/m   General: Appears her stated age, in NAD. HEENT: Head: normal shape and size, maxillary sinus tenderness noted;  Ears: Tm's gray and intact, normal light reflex; Nose: mucosa boggy and moist, turbinates swollen; Throat/Mouth: + PND. Teeth present, mucosa pink and moist, no exudate noted, no lesions or ulcerations noted.  Neck:  No adenopathy noted.   Pulmonary/Chest: Normal effort and positive vesicular breath sounds. No respiratory distress. No wheezes, rales or ronchi noted.       Assessment & Plan:   Acute Bacterial Sinusitis  Can use a Neti Pot which can be purchased from your local drug store. Flonase 2 sprays each nostril for 3 days and then as needed. eRx for Azithromax x 5  days  RTC as needed or if symptoms persist. Webb Silversmith, NP

## 2017-12-20 NOTE — Patient Instructions (Signed)

## 2017-12-28 DIAGNOSIS — H35371 Puckering of macula, right eye: Secondary | ICD-10-CM | POA: Diagnosis not present

## 2017-12-28 DIAGNOSIS — H25813 Combined forms of age-related cataract, bilateral: Secondary | ICD-10-CM | POA: Diagnosis not present

## 2017-12-28 DIAGNOSIS — H43813 Vitreous degeneration, bilateral: Secondary | ICD-10-CM | POA: Diagnosis not present

## 2018-01-15 ENCOUNTER — Ambulatory Visit: Payer: Medicare HMO

## 2018-01-17 DIAGNOSIS — R69 Illness, unspecified: Secondary | ICD-10-CM | POA: Diagnosis not present

## 2018-01-20 ENCOUNTER — Telehealth: Payer: Self-pay | Admitting: Family Medicine

## 2018-01-20 DIAGNOSIS — I10 Essential (primary) hypertension: Secondary | ICD-10-CM

## 2018-01-20 DIAGNOSIS — R739 Hyperglycemia, unspecified: Secondary | ICD-10-CM

## 2018-01-20 DIAGNOSIS — E7800 Pure hypercholesterolemia, unspecified: Secondary | ICD-10-CM

## 2018-01-20 NOTE — Telephone Encounter (Signed)
-----   Message from Eustace Pen, LPN sent at 5/85/9292 11:23 AM EDT ----- Regarding: Labs 4/13 Lab orders needed. Thank you.  Insurance:  [employer]

## 2018-01-21 ENCOUNTER — Ambulatory Visit: Payer: Medicare HMO

## 2018-01-22 ENCOUNTER — Encounter: Payer: Medicare HMO | Admitting: Family Medicine

## 2018-01-22 ENCOUNTER — Telehealth: Payer: Self-pay

## 2018-01-22 NOTE — Telephone Encounter (Signed)
Patient missed [facility] appt on 01/21/18. Attempted to reach pt to reschedule appt. Attempt unsuccessful. Left message on primary phone.

## 2018-01-29 ENCOUNTER — Encounter: Payer: Self-pay | Admitting: Family Medicine

## 2018-01-29 ENCOUNTER — Ambulatory Visit (INDEPENDENT_AMBULATORY_CARE_PROVIDER_SITE_OTHER): Payer: Medicare HMO

## 2018-01-29 VITALS — BP 118/72 | HR 83 | Temp 98.8°F | Ht 62.5 in | Wt 138.8 lb

## 2018-01-29 DIAGNOSIS — E7800 Pure hypercholesterolemia, unspecified: Secondary | ICD-10-CM | POA: Diagnosis not present

## 2018-01-29 DIAGNOSIS — I10 Essential (primary) hypertension: Secondary | ICD-10-CM

## 2018-01-29 DIAGNOSIS — Z0000 Encounter for general adult medical examination without abnormal findings: Secondary | ICD-10-CM

## 2018-01-29 DIAGNOSIS — R739 Hyperglycemia, unspecified: Secondary | ICD-10-CM

## 2018-01-29 LAB — CBC WITH DIFFERENTIAL/PLATELET
Basophils Absolute: 0 10*3/uL (ref 0.0–0.1)
Basophils Relative: 0.5 % (ref 0.0–3.0)
Eosinophils Absolute: 0.2 10*3/uL (ref 0.0–0.7)
Eosinophils Relative: 3.9 % (ref 0.0–5.0)
HCT: 37.2 % (ref 36.0–46.0)
Hemoglobin: 12.8 g/dL (ref 12.0–15.0)
Lymphocytes Relative: 24.4 % (ref 12.0–46.0)
Lymphs Abs: 1.4 10*3/uL (ref 0.7–4.0)
MCHC: 34.5 g/dL (ref 30.0–36.0)
MCV: 92.7 fl (ref 78.0–100.0)
Monocytes Absolute: 0.4 10*3/uL (ref 0.1–1.0)
Monocytes Relative: 6.5 % (ref 3.0–12.0)
Neutro Abs: 3.8 10*3/uL (ref 1.4–7.7)
Neutrophils Relative %: 64.7 % (ref 43.0–77.0)
Platelets: 223 10*3/uL (ref 150.0–400.0)
RBC: 4.02 Mil/uL (ref 3.87–5.11)
RDW: 13.3 % (ref 11.5–15.5)
WBC: 5.9 10*3/uL (ref 4.0–10.5)

## 2018-01-29 LAB — COMPREHENSIVE METABOLIC PANEL
ALT: 16 U/L (ref 0–35)
AST: 24 U/L (ref 0–37)
Albumin: 4 g/dL (ref 3.5–5.2)
Alkaline Phosphatase: 90 U/L (ref 39–117)
BUN: 22 mg/dL (ref 6–23)
CO2: 30 mEq/L (ref 19–32)
Calcium: 9.4 mg/dL (ref 8.4–10.5)
Chloride: 104 mEq/L (ref 96–112)
Creatinine, Ser: 1.11 mg/dL (ref 0.40–1.20)
GFR: 50.87 mL/min — ABNORMAL LOW (ref >60.00)
Glucose, Bld: 104 mg/dL — ABNORMAL HIGH (ref 70–99)
Potassium: 5.2 mEq/L — ABNORMAL HIGH (ref 3.5–5.1)
Sodium: 138 mEq/L (ref 135–145)
Total Bilirubin: 0.5 mg/dL (ref 0.2–1.2)
Total Protein: 6.7 g/dL (ref 6.0–8.3)

## 2018-01-29 LAB — LIPID PANEL
Cholesterol: 147 mg/dL (ref 0–200)
HDL: 43.7 mg/dL (ref >39.00)
LDL Cholesterol: 85 mg/dL (ref 0–99)
NonHDL: 103.6
Total CHOL/HDL Ratio: 3
Triglycerides: 94 mg/dL (ref 0.0–149.0)
VLDL: 18.8 mg/dL (ref 0.0–40.0)

## 2018-01-29 LAB — TSH: TSH: 1.65 u[IU]/mL (ref 0.35–4.50)

## 2018-01-29 LAB — HEMOGLOBIN A1C: Hgb A1c MFr Bld: 5.9 % (ref 4.6–6.5)

## 2018-01-29 NOTE — Patient Instructions (Signed)
[name] , Thank you for taking time to come for your Medicare Wellness Visit. I appreciate your ongoing commitment to your health goals. Please review the following plan we discussed and let me know if I can assist you in the future.   These are the goals we discussed: Goals    . Increase physical activity     Starting Summer 2019 after retirement, I plan to exercise for 60 minutes 3 days per week.        This is a list of the screening recommended for you and due dates:  Health Maintenance  Topic Date Due  . Mammogram  02/26/2018  . Flu Shot  05/09/2018  . Tetanus Vaccine  01/26/2021  . Colon Cancer Screening  01/03/2024  . DEXA scan (bone density measurement)  Completed  . Pneumonia vaccines  Completed   Preventive Care for Adults  A healthy lifestyle and preventive care can promote health and wellness. Preventive health guidelines for adults include the following key practices.  . A routine yearly physical is a good way to check with your health care provider about your health and preventive screening. It is a chance to share any concerns and updates on your health and to receive a thorough exam.  . Visit your dentist for a routine exam and preventive care every 6 months. Brush your teeth twice a day and floss once a day. Good oral hygiene prevents tooth decay and gum disease.  . The frequency of eye exams is based on your age, health, family medical history, use  of contact lenses, and other factors. Follow your health care provider's recommendations for frequency of eye exams.  . Eat a healthy diet. Foods like vegetables, fruits, whole grains, low-fat dairy products, and lean protein foods contain the nutrients you need without too many calories. Decrease your intake of foods high in solid fats, added sugars, and salt. Eat the right amount of calories for you. Get information about a proper diet from your health care provider, if necessary.  . Regular physical exercise is  one of the most important things you can do for your health. Most adults should get at least 150 minutes of moderate-intensity exercise (any activity that increases your heart rate and causes you to sweat) each week. In addition, most adults need muscle-strengthening exercises on 2 or more days a week.  Silver Sneakers may be a benefit available to you. To determine eligibility, you may visit the website: www.silversneakers.com or contact program at [phone] Mon-Fri between 8AM-8PM.   . Maintain a healthy weight. The body mass index (BMI) is a screening tool to identify possible weight problems. It provides an estimate of body fat based on height and weight. Your health care provider can find your BMI and can help you achieve or maintain a healthy weight.   For adults 20 years and older: ? A BMI below 18.5 is considered underweight. ? A BMI of 18.5 to 24.9 is normal. ? A BMI of 25 to 29.9 is considered overweight. ? A BMI of 30 and above is considered obese.   . Maintain normal blood lipids and cholesterol levels by exercising and minimizing your intake of saturated fat. Eat a balanced diet with plenty of fruit and vegetables. Blood tests for lipids and cholesterol should begin at age 89 and be repeated every 5 years. If your lipid or cholesterol levels are high, you are over 50, or you are at high risk for heart disease, you may need your cholesterol  levels checked more frequently. Ongoing high lipid and cholesterol levels should be treated with medicines if diet and exercise are not working.  . If you smoke, find out from your health care provider how to quit. If you do not use tobacco, please do not start.  . If you choose to drink alcohol, please do not consume more than 2 drinks per day. One drink is considered to be 12 ounces (355 mL) of beer, 5 ounces (148 mL) of wine, or 1.5 ounces (44 mL) of liquor.  . If you are 11-22 years old, ask your health care provider if you should take  aspirin to prevent strokes.  . Use sunscreen. Apply sunscreen liberally and repeatedly throughout the day. You should seek shade when your shadow is shorter than you. Protect yourself by wearing long sleeves, pants, a wide-brimmed hat, and sunglasses year round, whenever you are outdoors.  . Once a month, do a whole body skin exam, using a mirror to look at the skin on your back. Tell your health care provider of new moles, moles that have irregular borders, moles that are larger than a pencil eraser, or moles that have changed in shape or color.

## 2018-01-29 NOTE — Progress Notes (Signed)
Subjective:   [name] is a 77 y.o. female who presents for Medicare Annual (Subsequent) preventive examination.  Review of Systems:  N/A Cardiac Risk Factors include: advanced age (>62men, >29 women);dyslipidemia;hypertension     Objective:     Vitals: BP 118/72 (BP Location: Left Arm, Patient Position: Sitting, Cuff Size: Normal)   Pulse 83   Temp 98.8 F (37.1 C) (Oral)   Ht 5' 2.5" (1.588 m) Comment: no shoes  Wt 138 lb 12 oz (62.9 kg)   SpO2 95%   BMI 24.97 kg/m   Body mass index is 24.97 kg/m.  Advanced Directives 01/29/2018 01/12/2017  Does Patient Have a Medical Advance Directive? Yes Yes  Type of Paramedic of [place];Living will [place];Living will  Copy of [place] in Chart? No - copy requested No - copy requested    Tobacco Social History   Tobacco Use  Smoking Status Never Smoker  Smokeless Tobacco Never Used  Tobacco Comment   smoked 1 year after high school, never since     Counseling given: No Comment: smoked 1 year after high school, never since   Clinical Intake:  Pre-visit preparation completed: Yes  Pain : No/denies pain Pain Score: 0-No pain     Nutritional Status: BMI 25 -29 Overweight Nutritional Risks: None Diabetes: No  How often do you need to have someone help you when you read instructions, pamphlets, or other written materials from your doctor or pharmacy?: 1 - Never What is the last grade level you completed in school?: 12th grade + 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 Back pain   . Chronic constipation   . Hyperlipidemia   . Hypertension   . Osteopenia    Past Surgical History:  Procedure Laterality Date  . CLEFT PALATE REPAIR    . FOOT NEUROMA SURGERY  2/06  . NEPHRECTOMY LIVING DONOR     Left  . TUBAL LIGATION      Family History  Problem Relation Age of Onset  . Colon cancer Mother   . Diabetes Mother   . Coronary artery disease Mother   . Osteoporosis Mother   . Dementia Mother   . Coronary artery disease Father   . Heart failure Father   . Hypertension Father   . Stomach cancer Neg Hx    Social History   Socioeconomic History  . Marital status: Married    Spouse name: Not on file  . Number of children: Not on file  . Years of education: Not on file  . Highest education level: Not on file  Occupational History  . Occupation: works at [employer]  . Financial resource strain: Not on file  . Food insecurity:    Worry: Not on file    Inability: Not on file  . Transportation needs:    Medical: Not on file    Non-medical: Not on file  Tobacco Use  . Smoking status: Never Smoker  . Smokeless tobacco: Never Used  . Tobacco comment: smoked 1 year after high school, never since  Substance and Sexual Activity  . Alcohol use: No    Alcohol/week: 0.0 oz  . Drug use: No  . Sexual activity: Not on file  Lifestyle  . Physical activity:    Days per week: Not on file  Minutes per session: Not on file  . Stress: Not on file  Relationships  . Social connections:    Talks on phone: Not on file    Gets together: Not on file    Attends religious service: Not on file    Active member of club or organization: Not on file    Attends meetings of clubs or organizations: Not on file    Relationship status: Not on file  Other Topics Concern  . Not on file  Social History Narrative  . Not on file    Outpatient Encounter Medications as of 01/29/2018  Medication Sig  . albuterol (PROVENTIL HFA;VENTOLIN HFA) 108 (90 Base) MCG/ACT inhaler Inhale 2 puffs into the lungs every 4 (four) hours as needed for wheezing or shortness of breath (cough, shortness of breath or wheezing.).  Marland Kitchen aspirin 81 MG tablet Take 81 mg by mouth daily.  Marland Kitchen atorvastatin (LIPITOR) 10 MG tablet TAKE 1 TABLET(10  MG) BY MOUTH DAILY  . bisacodyl (DULCOLAX) 5 MG EC tablet Take 5 mg by mouth daily as needed.    . Cholecalciferol (VITAMIN D3 PO) Take 1 capsule by mouth daily.  . Glucosamine HCl (GLUCOSAMINE PO) Take 1 tablet by mouth daily.  Marland Kitchen lisinopril (PRINIVIL,ZESTRIL) 10 MG tablet Take 1 tablet (10 mg total) by mouth daily.  . Multiple Vitamins-Minerals (CENTRUM SILVER) tablet Take 1 tablet by mouth daily.    . [DISCONTINUED] azithromycin (ZITHROMAX) 250 MG tablet Take 2 tabs today, then 1 tab daily x 4 days   No facility-administered encounter medications on file as of 01/29/2018.     Activities of Daily Living In your present state of health, do you have any difficulty performing the following activities: 01/29/2018  Hearing? Y  Vision? N  Difficulty concentrating or making decisions? Y  Walking or climbing stairs? N  Dressing or bathing? N  Doing errands, shopping? N  Preparing Food and eating ? N  Using the Toilet? N  In the past six months, have you accidently leaked urine? N  Do you have problems with loss of bowel control? N  Managing your Medications? N  Managing your Finances? N  Housekeeping or managing your Housekeeping? N  Some recent data might be hidden    Patient Care Team: Tower, Wynelle Fanny, MD as PCP - Aris Georgia, MD as Consulting Physician (Ophthalmology)    Assessment:   This is a routine wellness examination for [place].   Hearing Screening   125Hz  250Hz  500Hz  1000Hz  2000Hz  3000Hz  4000Hz  6000Hz  8000Hz   Right ear:   0 0 0  0    Left ear:   40 40 40  40    Vision Screening Comments: Vision exam in April 2019 with Dr. Kathrin Penner  Exercise Activities and Dietary recommendations Current Exercise Habits: The patient does not participate in regular exercise at present, Exercise limited by: None identified  Goals    . Increase physical activity     Starting Summer 2019 after retirement, I plan to exercise for 60 minutes 3 days per week.        Fall  Risk Fall Risk  01/29/2018 01/12/2017 06/11/2015 11/14/2013  Falls in the past year? No No No No   Depression Screen PHQ 2/9 Scores 01/29/2018 01/12/2017 06/11/2015 11/14/2013  PHQ - 2 Score 0 0 0 0  PHQ- 9 Score 0 - - -     Cognitive Function MMSE - Mini Mental State Exam 01/29/2018 01/12/2017  Orientation to time 5 5  Orientation to  Place 5 5  Registration 3 3  Attention/ Calculation 0 0  Recall 3 2  Recall-comments - pt was unable to recall 1 of 3 words  Language- name 2 objects 0 0  Language- repeat 1 1  Language- follow 3 step command 3 3  Language- read & follow direction 0 0  Write a sentence 0 0  Copy design 0 0  Total score 20 19     PLEASE NOTE: A Mini-Cog screen was completed. Maximum score is 20. A value of 0 denotes this part of [place] MMSE was not completed or the patient failed this part of the Mini-Cog screening.   Mini-Cog Screening Orientation to Time - Max 5 pts Orientation to Place - Max 5 pts Registration - Max 3 pts Recall - Max 3 pts Language Repeat - Max 1 pts Language Follow 3 Step Command - Max 3 pts     Immunization History  Administered Date(s) Administered  . Influenza Split 09/20/2012  . Influenza,inj,Quad PF,6+ Mos 06/11/2015  . Influenza-Unspecified 07/09/2016  . Pneumococcal Conjugate-13 06/11/2015  . Pneumococcal Polysaccharide-23 11/14/2013  . Td 03/31/2002  . Tdap 01/27/2011  . Zoster 11/11/2011    Screening Tests Health Maintenance  Topic Date Due  . MAMMOGRAM  02/26/2018  . INFLUENZA VACCINE  05/09/2018  . TETANUS/TDAP  01/26/2021  . COLONOSCOPY  01/03/2024  . DEXA SCAN  Completed  . PNA vac Low Risk Adult  Completed       Plan:     I have personally reviewed, addressed, and noted the following in the patient's chart:  A. Medical and social history B. Use of alcohol, tobacco or illicit drugs  C. Current medications and supplements D. Functional ability and status E.  Nutritional status F.  Physical activity G. Advance  directives H. List of other physicians I.  Hospitalizations, surgeries, and ER visits in previous 12 months J.  [place] to include hearing, vision, cognitive, depression L. Referrals and appointments - none  In addition, I have reviewed and discussed with patient certain preventive protocols, quality metrics, and best practice recommendations. A written personalized care plan for preventive services as well as general preventive health recommendations were provided to patient.  See attached scanned questionnaire for additional information.   Signed,   Lindell Noe, MHA, BS, LPN Health Coach

## 2018-01-29 NOTE — Progress Notes (Signed)
PCP notes:   Health maintenance:  No gaps identified.  Abnormal screenings:   Hearing - failed  Hearing Screening   125Hz  250Hz  500Hz  1000Hz  2000Hz  3000Hz  4000Hz  6000Hz  8000Hz   Right ear:   0 0 0  0    Left ear:   40 40 40  40     Patient concerns:   None  Nurse concerns:  None  Next PCP appt:   02/01/18 @ 1015  I reviewed health advisor's note, was available for consultation, and agree with documentation and plan. Loura Pardon MD

## 2018-02-01 ENCOUNTER — Ambulatory Visit (INDEPENDENT_AMBULATORY_CARE_PROVIDER_SITE_OTHER): Payer: Medicare HMO | Admitting: Family Medicine

## 2018-02-01 ENCOUNTER — Encounter: Payer: Self-pay | Admitting: Family Medicine

## 2018-02-01 VITALS — BP 128/68 | HR 83 | Temp 98.6°F | Ht 62.5 in | Wt 140.2 lb

## 2018-02-01 DIAGNOSIS — Z0000 Encounter for general adult medical examination without abnormal findings: Secondary | ICD-10-CM

## 2018-02-01 DIAGNOSIS — I10 Essential (primary) hypertension: Secondary | ICD-10-CM | POA: Diagnosis not present

## 2018-02-01 DIAGNOSIS — R739 Hyperglycemia, unspecified: Secondary | ICD-10-CM | POA: Diagnosis not present

## 2018-02-01 DIAGNOSIS — E875 Hyperkalemia: Secondary | ICD-10-CM | POA: Diagnosis not present

## 2018-02-01 DIAGNOSIS — M949 Disorder of cartilage, unspecified: Secondary | ICD-10-CM | POA: Diagnosis not present

## 2018-02-01 DIAGNOSIS — E7800 Pure hypercholesterolemia, unspecified: Secondary | ICD-10-CM | POA: Diagnosis not present

## 2018-02-01 DIAGNOSIS — M899 Disorder of bone, unspecified: Secondary | ICD-10-CM | POA: Diagnosis not present

## 2018-02-01 DIAGNOSIS — Z800 Family history of malignant neoplasm of digestive organs: Secondary | ICD-10-CM | POA: Diagnosis not present

## 2018-02-01 MED ORDER — ATORVASTATIN CALCIUM 10 MG PO TABS: ORAL_TABLET | ORAL | 3 refills | Status: DC

## 2018-02-01 MED ORDER — LISINOPRIL 10 MG PO TABS: 10.0000 mg | ORAL_TABLET | Freq: Every day | ORAL | 3 refills | Status: DC

## 2018-02-01 NOTE — Progress Notes (Signed)
Subjective:    Patient ID: [name], female    DOB: August 21, 1941, 77 y.o.   MRN: [id number]  HPI Here for health maintenance exam and to review chronic medical problems    Family has lived with them for 7 weeks building a house  They just moved out  Really enjoyed it   Has been doing well  Nothing new overall   Tends to get cramps in legs  Worse with steps    Wt Readings from Last 3 Encounters:  02/01/18 140 lb 4 oz (63.6 kg)  01/29/18 138 lb 12 oz (62.9 kg)  12/20/17 141 lb (64 kg)  mt her weight well  25.24 kg/m   Had amw on 4/23 Failed hearing in R ear -she has noticed that  Does not want further eval   Mammogram 5/18 -neg  (will set that up herself)  Self breast exam -no breaset lumps   Colonoscopy 3/15 no recall due to age Mother had colon cancer - age 78    bp is stable today  No cp or palpitations or headaches or edema  No side effects to medicines  BP Readings from Last 3 Encounters:  02/01/18 128/68  01/29/18 118/72  12/20/17 122/84      dexa 5/18 osteopenia  Down at the hip slightly T -1.4  No falls or fractures  Exercise -not enough - she likes to walk / just joined a gym/ is retiring in may   zostavax 2/13-interested  In shingrix    Hyperlipidemia Lab Results  Component Value Date   CHOL 147 01/29/2018   CHOL 185 01/12/2017   CHOL 158 05/28/2015   Lab Results  Component Value Date   HDL 43.70 01/29/2018   HDL 46.60 01/12/2017   HDL 38.20 (L) 05/28/2015   Lab Results  Component Value Date   LDLCALC 85 01/29/2018   LDLCALC 111 (H) 01/12/2017   LDLCALC 93 05/28/2015   Lab Results  Component Value Date   TRIG 94.0 01/29/2018   TRIG 137.0 01/12/2017   TRIG 134.0 05/28/2015   Lab Results  Component Value Date   CHOLHDL 3 01/29/2018   CHOLHDL 4 01/12/2017   CHOLHDL 4 05/28/2015   No results found for: LDLDIRECT Atorvastatin and diet  Is eating healthy /not as much - smaller portions in general   Hyperglycemia  Lab  Results  Component Value Date   HGBA1C 5.9 01/29/2018   Down from 6.1  Other labs Results for orders placed or performed in visit on 01/29/18  TSH  Result Value Ref Range   TSH 1.65 0.35 - 4.50 uIU/mL  Lipid panel  Result Value Ref Range   Cholesterol 147 0 - 200 mg/dL   Triglycerides 94.0 0.0 - 149.0 mg/dL   HDL 43.70 >39.00 mg/dL   VLDL 18.8 0.0 - 40.0 mg/dL   LDL Cholesterol 85 0 - 99 mg/dL   Total CHOL/HDL Ratio 3    NonHDL 103.60   Hemoglobin A1c  Result Value Ref Range   Hgb A1c MFr Bld 5.9 4.6 - 6.5 %  Comprehensive metabolic panel  Result Value Ref Range   Sodium 138 135 - 145 mEq/L   Potassium 5.2 (H) 3.5 - 5.1 mEq/L   Chloride 104 96 - 112 mEq/L   CO2 30 19 - 32 mEq/L   Glucose, Bld 104 (H) 70 - 99 mg/dL   BUN 22 6 - 23 mg/dL   Creatinine, Ser 1.11 0.40 - 1.20 mg/dL   Total Bilirubin 0.5  0.2 - 1.2 mg/dL   Alkaline Phosphatase 90 39 - 117 U/L   AST 24 0 - 37 U/L   ALT 16 0 - 35 U/L   Total Protein 6.7 6.0 - 8.3 g/dL   Albumin 4.0 3.5 - 5.2 g/dL   Calcium 9.4 8.4 - 10.5 mg/dL   GFR 50.87 (L) >60.00 mL/min  CBC with Differential/Platelet  Result Value Ref Range   WBC 5.9 4.0 - 10.5 K/uL   RBC 4.02 3.87 - 5.11 Mil/uL   Hemoglobin 12.8 12.0 - 15.0 g/dL   HCT 37.2 36.0 - 46.0 %   MCV 92.7 78.0 - 100.0 fl   MCHC 34.5 30.0 - 36.0 g/dL   RDW 13.3 11.5 - 15.5 %   Platelets 223.0 150.0 - 400.0 K/uL   Neutrophils Relative % 64.7 43.0 - 77.0 %   Lymphocytes Relative 24.4 12.0 - 46.0 %   Monocytes Relative 6.5 3.0 - 12.0 %   Eosinophils Relative 3.9 0.0 - 5.0 %   Basophils Relative 0.5 0.0 - 3.0 %   Neutro Abs 3.8 1.4 - 7.7 K/uL   Lymphs Abs 1.4 0.7 - 4.0 K/uL   Monocytes Absolute 0.4 0.1 - 1.0 K/uL   Eosinophils Absolute 0.2 0.0 - 0.7 K/uL   Basophils Absolute 0.0 0.0 - 0.1 K/uL    She does not take a K tablet Is on lisinopril  She eats a lot of bananas   Not enough water    Patient Active Problem List   Diagnosis Date Noted  . Hyperkalemia  02/01/2018  . Screening mammogram, encounter for 01/19/2017  . Estrogen deficiency 01/19/2017  . Right knee pain 11/09/2015  . Left shoulder pain 11/09/2015  . Routine general medical examination at a health care facility 06/11/2015  . Encounter for Medicare annual wellness exam 11/14/2013  . Dermatitis 11/14/2013  . Family history of colon cancer 11/14/2013  . Colon cancer screening 11/14/2013  . Right hip pain 01/31/2013  . Hypertension 01/27/2011  . Post-menopausal 01/27/2011  . BACK PAIN 03/13/2008  . Hyperlipidemia 12/20/2007  . CONSTIPATION, CHRONIC 12/20/2007  . Disorder of bone and cartilage 12/20/2007  . Hyperglycemia 12/20/2007   Past Medical History:  Diagnosis Date  . Back pain   . Chronic constipation   . Hyperlipidemia   . Hypertension   . Osteopenia    Past Surgical History:  Procedure Laterality Date  . CLEFT PALATE REPAIR    . FOOT NEUROMA SURGERY  2/06  . NEPHRECTOMY LIVING DONOR     Left  . TUBAL LIGATION     Social History   Tobacco Use  . Smoking status: Never Smoker  . Smokeless tobacco: Never Used  . Tobacco comment: smoked 1 year after high school, never since  Substance Use Topics  . Alcohol use: No    Alcohol/week: 0.0 oz  . Drug use: No   Family History  Problem Relation Age of Onset  . Colon cancer Mother   . Diabetes Mother   . Coronary artery disease Mother   . Osteoporosis Mother   . Dementia Mother   . Coronary artery disease Father   . Heart failure Father   . Hypertension Father   . Stomach cancer Neg Hx    Allergies  Allergen Reactions  . Codeine   . Hydrocodone-Homatropine     Caused flu-like symptoms   Current Outpatient Medications on File Prior to Visit  Medication Sig Dispense Refill  . albuterol (PROVENTIL HFA;VENTOLIN HFA) 108 (90 Base) MCG/ACT inhaler  Inhale 2 puffs into the lungs every 4 (four) hours as needed for wheezing or shortness of breath (cough, shortness of breath or wheezing.). 1 Inhaler 1  .  aspirin 81 MG tablet Take 81 mg by mouth daily.    . bisacodyl (DULCOLAX) 5 MG EC tablet Take 5 mg by mouth daily as needed.      . Cholecalciferol (VITAMIN D3 PO) Take 1 capsule by mouth daily.    . Glucosamine HCl (GLUCOSAMINE PO) Take 1 tablet by mouth daily.    . Multiple Vitamins-Minerals (CENTRUM SILVER) tablet Take 1 tablet by mouth daily.       No current facility-administered medications on file prior to visit.     Review of Systems  Constitutional: Negative for activity change, appetite change, fatigue, fever and unexpected weight change.  HENT: Negative for congestion, ear pain, rhinorrhea, sinus pressure and sore throat.   Eyes: Negative for pain, redness and visual disturbance.  Respiratory: Negative for cough, shortness of breath and wheezing.   Cardiovascular: Negative for chest pain and palpitations.  Gastrointestinal: Negative for abdominal pain, blood in stool, constipation and diarrhea.  Endocrine: Negative for polydipsia and polyuria.  Genitourinary: Negative for dysuria, frequency and urgency.  Musculoskeletal: Negative for arthralgias, back pain and myalgias.       Occ leg cramps  Skin: Negative for pallor and rash.  Allergic/Immunologic: Negative for environmental allergies.  Neurological: Negative for dizziness, syncope and headaches.  Hematological: Negative for adenopathy. Does not bruise/bleed easily.  Psychiatric/Behavioral: Negative for decreased concentration and dysphoric mood. The patient is not nervous/anxious.        Objective:   Physical Exam  Constitutional: She appears well-developed and well-nourished. No distress.  Well appearing   HENT:  Head: Normocephalic and atraumatic.  Right Ear: External ear normal.  Left Ear: External ear normal.  Mouth/Throat: Oropharynx is clear and moist.  Eyes: Pupils are equal, round, and reactive to light. Conjunctivae and EOM are normal. No scleral icterus.  Neck: Normal range of motion. Neck supple. No JVD  present. Carotid bruit is not present. No thyromegaly present.  Cardiovascular: Normal rate, regular rhythm, normal heart sounds and intact distal pulses. Exam reveals no gallop.  Pulmonary/Chest: Effort normal and breath sounds normal. No respiratory distress. She has no wheezes. She exhibits no tenderness. No breast swelling, tenderness, discharge or bleeding.  Abdominal: Soft. Bowel sounds are normal. She exhibits no distension, no abdominal bruit and no mass. There is no tenderness.  Genitourinary: No breast swelling, tenderness, discharge or bleeding.  Genitourinary Comments: Breast exam: No mass, nodules, thickening, tenderness, bulging, retraction, inflamation, nipple discharge or skin changes noted.  No axillary or clavicular LA.      Musculoskeletal: Normal range of motion. She exhibits no edema or tenderness.  Lymphadenopathy:    She has no cervical adenopathy.  Neurological: She is alert. She has normal reflexes. No cranial nerve deficit. She exhibits normal muscle tone. Coordination normal.  Skin: Skin is warm and dry. No rash noted. No erythema. No pallor.  Solar lentigines diffusely   Psychiatric: She has a normal mood and affect.          Assessment & Plan:   Problem List Items Addressed This Visit      Cardiovascular and Mediastinum   Hypertension    bp in fair control at this time  BP Readings from Last 1 Encounters:  02/01/18 128/68   No changes needed Disc lifstyle change with low sodium diet and exercise  Labs reviewed  Relevant Medications   atorvastatin (LIPITOR) 10 MG tablet   lisinopril (PRINIVIL,ZESTRIL) 10 MG tablet     Musculoskeletal and Integument   Disorder of bone and cartilage    dexa 5/18 -down at hip slightly  No falls/fractures  Walking/ gym for exercise  Continue to follow        Other   Family history of colon cancer    Colonoscopy 3/15 no recall due to age Ordered cologuard kit      Hyperglycemia    Lab Results    Component Value Date   HGBA1C 5.9 01/29/2018   Down from 6.1 disc imp of low glycemic diet and wt loss to prevent DM2       Hyperkalemia    On ace  Lab Results  Component Value Date   K 5.2 (H) 01/29/2018   Will eat less bananas-avoid suppl with K  Continue to watch  Consider change ace if it goes up      Hyperlipidemia    Disc goals for lipids and reasons to control them Rev labs with pt Rev low sat fat diet in detail Atorvastatin and diet  Good control      Relevant Medications   atorvastatin (LIPITOR) 10 MG tablet   lisinopril (PRINIVIL,ZESTRIL) 10 MG tablet   Routine general medical examination at a health care facility - Primary    Reviewed health habits including diet and exercise and skin cancer prevention Reviewed appropriate screening tests for age  Also reviewed health mt list, fam hx and immunization status , as well as social and family history   amw rev See HPI Labs rev  shingrix discussed cologuard ordered

## 2018-02-01 NOTE — Patient Instructions (Addendum)
Magnesium supplement may help cramps 250 mg  Also tonic water -for the quinine  Also lots of stretching / yoga   Mammogram is due in may  Don't forget to schedule it   We will order the cologuard screening for colon screen   Start regular exercise with retirement   If you are interested in the new shingles vaccine (Shingrix) - call your local pharmacy to check on coverage and availability  If interested - get  On a wait list at your pharmacy   Don't forget to get a flu shot every fall   Less bananas - choose a different fruit (potassium is a little high)  Let's re check potassium in a month

## 2018-02-03 NOTE — Assessment & Plan Note (Signed)
On ace  Lab Results  Component Value Date   K 5.2 (H) 01/29/2018   Will eat less bananas-avoid suppl with K  Continue to watch  Consider change ace if it goes up

## 2018-02-03 NOTE — Assessment & Plan Note (Signed)
Reviewed health habits including diet and exercise and skin cancer prevention Reviewed appropriate screening tests for age  Also reviewed health mt list, fam hx and immunization status , as well as social and family history   amw rev See HPI Labs rev  shingrix discussed cologuard ordered

## 2018-02-03 NOTE — Assessment & Plan Note (Signed)
Lab Results  Component Value Date   HGBA1C 5.9 01/29/2018   Down from 6.1 disc imp of low glycemic diet and wt loss to prevent DM2

## 2018-02-03 NOTE — Assessment & Plan Note (Signed)
bp in fair control at this time  BP Readings from Last 1 Encounters:  02/01/18 128/68   No changes needed Disc lifstyle change with low sodium diet and exercise  Labs reviewed

## 2018-02-03 NOTE — Assessment & Plan Note (Signed)
Colonoscopy 3/15 no recall due to age Ordered cologuard kit 

## 2018-02-03 NOTE — Assessment & Plan Note (Signed)
Disc goals for lipids and reasons to control them Rev labs with pt Rev low sat fat diet in detail Atorvastatin and diet  Good control

## 2018-02-03 NOTE — Assessment & Plan Note (Signed)
dexa 5/18 -down at hip slightly  No falls/fractures  Walking/ gym for exercise  Continue to follow

## 2018-02-25 DIAGNOSIS — Z1212 Encounter for screening for malignant neoplasm of rectum: Secondary | ICD-10-CM | POA: Diagnosis not present

## 2018-02-25 DIAGNOSIS — Z1211 Encounter for screening for malignant neoplasm of colon: Secondary | ICD-10-CM | POA: Diagnosis not present

## 2018-02-25 LAB — COLOGUARD

## 2018-02-26 LAB — COLOGUARD: Cologuard: NEGATIVE

## 2018-02-28 ENCOUNTER — Telehealth: Payer: Self-pay | Admitting: Family Medicine

## 2018-02-28 DIAGNOSIS — E875 Hyperkalemia: Secondary | ICD-10-CM

## 2018-02-28 NOTE — Telephone Encounter (Signed)
-----   Message from Lendon Collar, RT sent at 02/18/2018  5:26 PM EDT ----- Regarding: Lab orders for Friday 03/01/18 Please enter lab orders for Friday 03/01/18. Thanks-Lauren

## 2018-03-01 ENCOUNTER — Other Ambulatory Visit (INDEPENDENT_AMBULATORY_CARE_PROVIDER_SITE_OTHER): Payer: Medicare HMO

## 2018-03-01 DIAGNOSIS — E875 Hyperkalemia: Secondary | ICD-10-CM

## 2018-03-01 LAB — BASIC METABOLIC PANEL
BUN: 17 mg/dL (ref 6–23)
CO2: 30 mEq/L (ref 19–32)
Calcium: 10 mg/dL (ref 8.4–10.5)
Chloride: 103 mEq/L (ref 96–112)
Creatinine, Ser: 1.11 mg/dL (ref 0.40–1.20)
GFR: 50.85 mL/min — ABNORMAL LOW (ref >60.00)
Glucose, Bld: 106 mg/dL — ABNORMAL HIGH (ref 70–99)
Potassium: 4.8 mEq/L (ref 3.5–5.1)
Sodium: 140 mEq/L (ref 135–145)

## 2018-03-07 ENCOUNTER — Encounter: Payer: Self-pay | Admitting: Family Medicine

## 2018-03-13 ENCOUNTER — Encounter: Payer: Self-pay | Admitting: *Deleted

## 2018-04-16 ENCOUNTER — Other Ambulatory Visit: Payer: Self-pay | Admitting: Family Medicine

## 2018-04-16 DIAGNOSIS — Z1231 Encounter for screening mammogram for malignant neoplasm of breast: Secondary | ICD-10-CM

## 2018-04-24 DIAGNOSIS — R2231 Localized swelling, mass and lump, right upper limb: Secondary | ICD-10-CM | POA: Diagnosis not present

## 2018-04-24 DIAGNOSIS — M79644 Pain in right finger(s): Secondary | ICD-10-CM | POA: Diagnosis not present

## 2018-04-24 DIAGNOSIS — G5601 Carpal tunnel syndrome, right upper limb: Secondary | ICD-10-CM | POA: Diagnosis not present

## 2018-04-24 DIAGNOSIS — M25571 Pain in right ankle and joints of right foot: Secondary | ICD-10-CM | POA: Insufficient documentation

## 2018-05-08 ENCOUNTER — Ambulatory Visit
Admission: RE | Admit: 2018-05-08 | Discharge: 2018-05-08 | Disposition: A | Discharge status: Home / Self Care | Payer: Medicare HMO | Source: Ambulatory Visit | Attending: Family Medicine | Admitting: Family Medicine

## 2018-05-08 DIAGNOSIS — Z1231 Encounter for screening mammogram for malignant neoplasm of breast: Secondary | ICD-10-CM | POA: Diagnosis not present

## 2018-05-14 DIAGNOSIS — R69 Illness, unspecified: Secondary | ICD-10-CM | POA: Diagnosis not present

## 2018-05-22 DIAGNOSIS — M79644 Pain in right finger(s): Secondary | ICD-10-CM | POA: Diagnosis not present

## 2018-05-27 DIAGNOSIS — M79644 Pain in right finger(s): Secondary | ICD-10-CM | POA: Diagnosis not present

## 2018-05-27 DIAGNOSIS — R2231 Localized swelling, mass and lump, right upper limb: Secondary | ICD-10-CM | POA: Diagnosis not present

## 2018-05-27 DIAGNOSIS — G5600 Carpal tunnel syndrome, unspecified upper limb: Secondary | ICD-10-CM | POA: Insufficient documentation

## 2018-06-26 DIAGNOSIS — R69 Illness, unspecified: Secondary | ICD-10-CM | POA: Diagnosis not present

## 2018-08-23 ENCOUNTER — Ambulatory Visit: Payer: Medicare HMO | Admitting: Family Medicine

## 2018-08-26 ENCOUNTER — Ambulatory Visit (INDEPENDENT_AMBULATORY_CARE_PROVIDER_SITE_OTHER): Payer: Medicare HMO | Admitting: Family Medicine

## 2018-08-26 ENCOUNTER — Encounter: Payer: Self-pay | Admitting: Family Medicine

## 2018-08-26 VITALS — BP 140/76 | HR 81 | Temp 98.6°F | Ht 62.5 in | Wt 136.5 lb

## 2018-08-26 DIAGNOSIS — M79605 Pain in left leg: Secondary | ICD-10-CM | POA: Diagnosis not present

## 2018-08-26 DIAGNOSIS — M545 Low back pain, unspecified: Secondary | ICD-10-CM

## 2018-08-26 MED ORDER — METHOCARBAMOL 500 MG PO TABS: 500.0000 mg | ORAL_TABLET | Freq: Three times a day (TID) | ORAL | 0 refills | Status: DC | PRN

## 2018-08-26 NOTE — Assessment & Plan Note (Signed)
Intermittent (at times severe)- improved today  In active elderly female Given info on back pain and stretches/exercises  Px methocarbamol to use prn if needed with caution of sedation Ref to PT for eval/tx - to try to prevent re occurrence  Update if no improvement

## 2018-08-26 NOTE — Patient Instructions (Signed)
We will refer you to physical therapy for intermittent back pain   I sent a px for a muscle relaxer to use as needed with caution of sedation (only when needed) Heat - when symptomatic as well (10 min at at time)

## 2018-08-26 NOTE — Progress Notes (Signed)
Subjective:    Patient ID: [name], female    DOB: 08/26/1941, 77 y.o.   MRN: [id number]  HPI Here for back pain   Now is much improved It comes and goes  If she moves the wrong way- severe pain in L low back -hard to work and function  A week ago- could not get comfortable  Will ease up and then gets bad again   It does shoot down leg to the knee  No numbness or weakness No b/b problems   Took ibuprofen  Heat   Yesterday was able to haul wood and stack it   Exercise- fit for life (silver sneaker class) -with a lot of stretching or zumba  Walks 1 mile on treadmill also  3 d per week  Enjoys it    Patient Active Problem List   Diagnosis Date Noted  . Hyperkalemia 02/01/2018  . Screening mammogram, encounter for 01/19/2017  . Estrogen deficiency 01/19/2017  . Right knee pain 11/09/2015  . Left shoulder pain 11/09/2015  . Routine general medical examination at a health care facility 06/11/2015  . Encounter for Medicare annual wellness exam 11/14/2013  . Dermatitis 11/14/2013  . Family history of colon cancer 11/14/2013  . Colon cancer screening 11/14/2013  . Right hip pain 01/31/2013  . Hypertension 01/27/2011  . Post-menopausal 01/27/2011  . Low back pain radiating to left leg 03/13/2008  . Hyperlipidemia 12/20/2007  . CONSTIPATION, CHRONIC 12/20/2007  . Disorder of bone and cartilage 12/20/2007  . Hyperglycemia 12/20/2007   Past Medical History:  Diagnosis Date  . Back pain   . Chronic constipation   . Hyperlipidemia   . Hypertension   . Osteopenia    Past Surgical History:  Procedure Laterality Date  . CLEFT PALATE REPAIR    . FOOT NEUROMA SURGERY  2/06  . NEPHRECTOMY LIVING DONOR     Left  . TUBAL LIGATION     Social History   Tobacco Use  . Smoking status: Never Smoker  . Smokeless tobacco: Never Used  . Tobacco comment: smoked 1 year after high school, never since  Substance Use Topics  . Alcohol use: No    Alcohol/week: 0.0  standard drinks  . Drug use: No   Family History  Problem Relation Age of Onset  . Colon cancer Mother   . Diabetes Mother   . Coronary artery disease Mother   . Osteoporosis Mother   . Dementia Mother   . Coronary artery disease Father   . Heart failure Father   . Hypertension Father   . Stomach cancer Neg Hx    Allergies  Allergen Reactions  . Codeine   . Hydrocodone-Homatropine     Caused flu-like symptoms   Current Outpatient Medications on File Prior to Visit  Medication Sig Dispense Refill  . albuterol (PROVENTIL HFA;VENTOLIN HFA) 108 (90 Base) MCG/ACT inhaler Inhale 2 puffs into the lungs every 4 (four) hours as needed for wheezing or shortness of breath (cough, shortness of breath or wheezing.). 1 Inhaler 1  . aspirin 81 MG tablet Take 81 mg by mouth daily.    Marland Kitchen atorvastatin (LIPITOR) 10 MG tablet TAKE 1 TABLET(10 MG) BY MOUTH DAILY 90 tablet 3  . bisacodyl (DULCOLAX) 5 MG EC tablet Take 5 mg by mouth daily as needed.      . Cholecalciferol (VITAMIN D3 PO) Take 1 capsule by mouth daily.    . Glucosamine HCl (GLUCOSAMINE PO) Take 1 tablet by mouth daily.    Marland Kitchen  lisinopril (PRINIVIL,ZESTRIL) 10 MG tablet Take 1 tablet (10 mg total) by mouth daily. 90 tablet 3  . Multiple Vitamins-Minerals (CENTRUM SILVER) tablet Take 1 tablet by mouth daily.       No current facility-administered medications on file prior to visit.     Review of Systems  Constitutional: Negative for activity change, appetite change, fatigue, fever and unexpected weight change.  HENT: Negative for congestion, ear pain, rhinorrhea, sinus pressure and sore throat.   Eyes: Negative for pain, redness and visual disturbance.  Respiratory: Negative for cough, shortness of breath and wheezing.   Cardiovascular: Negative for chest pain and palpitations.  Gastrointestinal: Negative for abdominal pain, blood in stool, constipation and diarrhea.  Endocrine: Negative for polydipsia and polyuria.  Genitourinary:  Negative for dysuria, frequency and urgency.  Musculoskeletal: Positive for back pain. Negative for arthralgias and myalgias.  Skin: Negative for pallor and rash.  Allergic/Immunologic: Negative for environmental allergies.  Neurological: Negative for dizziness, syncope, weakness, numbness and headaches.  Hematological: Negative for adenopathy. Does not bruise/bleed easily.  Psychiatric/Behavioral: Negative for decreased concentration and dysphoric mood. The patient is not nervous/anxious.        Objective:   Physical Exam  Constitutional: She appears well-developed and well-nourished. No distress.  Well appearing   HENT:  Head: Normocephalic and atraumatic.  Eyes: Pupils are equal, round, and reactive to light. Conjunctivae and EOM are normal. No scleral icterus.  Neck: Normal range of motion. Neck supple.  Cardiovascular: Normal rate and regular rhythm.  Pulmonary/Chest: Effort normal and breath sounds normal. She has no wheezes. She has no rales.  Abdominal: Soft. Bowel sounds are normal. She exhibits no distension. There is no tenderness.  Musculoskeletal: She exhibits tenderness.       Right shoulder: She exhibits tenderness and spasm. She exhibits normal range of motion, no bony tenderness, no swelling, no effusion, no crepitus, no deformity, normal pulse and normal strength.       Lumbar back: She exhibits decreased range of motion, tenderness and spasm. She exhibits no bony tenderness and no edema.  Some tenderness over L piriformis and SI joint area  Nl rom today/not in pain  Neg SLR Nl gait No neuro changes   Some loss of lordosis noted   Lymphadenopathy:    She has no cervical adenopathy.  Neurological: She is alert. She has normal strength and normal reflexes. She displays no atrophy. No cranial nerve deficit or sensory deficit. She exhibits normal muscle tone. Coordination normal.  Negative SLR  Skin: Skin is warm and dry. No rash noted. No erythema. No pallor.    Psychiatric: She has a normal mood and affect.  Pleasant           Assessment & Plan:   Problem List Items Addressed This Visit      Other   Low back pain radiating to left leg - Primary    Intermittent (at times severe)- improved today  In active elderly female Given info on back pain and stretches/exercises  Px methocarbamol to use prn if needed with caution of sedation Ref to PT for eval/tx - to try to prevent re occurrence  Update if no improvement       Relevant Medications   methocarbamol (ROBAXIN) 500 MG tablet   Other Relevant Orders   Ambulatory referral to Physical Therapy

## 2018-08-29 DIAGNOSIS — M5416 Radiculopathy, lumbar region: Secondary | ICD-10-CM | POA: Diagnosis not present

## 2018-08-29 DIAGNOSIS — M6281 Muscle weakness (generalized): Secondary | ICD-10-CM | POA: Diagnosis not present

## 2018-08-29 DIAGNOSIS — M545 Low back pain: Secondary | ICD-10-CM | POA: Diagnosis not present

## 2018-08-29 DIAGNOSIS — M79605 Pain in left leg: Secondary | ICD-10-CM | POA: Diagnosis not present

## 2018-09-13 DIAGNOSIS — L570 Actinic keratosis: Secondary | ICD-10-CM | POA: Diagnosis not present

## 2018-09-13 DIAGNOSIS — D225 Melanocytic nevi of trunk: Secondary | ICD-10-CM | POA: Diagnosis not present

## 2018-09-13 DIAGNOSIS — L821 Other seborrheic keratosis: Secondary | ICD-10-CM | POA: Diagnosis not present

## 2018-09-13 DIAGNOSIS — L814 Other melanin hyperpigmentation: Secondary | ICD-10-CM | POA: Diagnosis not present

## 2018-09-13 DIAGNOSIS — L918 Other hypertrophic disorders of the skin: Secondary | ICD-10-CM | POA: Diagnosis not present

## 2018-12-19 DIAGNOSIS — R69 Illness, unspecified: Secondary | ICD-10-CM | POA: Diagnosis not present

## 2019-01-23 ENCOUNTER — Other Ambulatory Visit: Payer: Self-pay | Admitting: Family Medicine

## 2019-02-03 ENCOUNTER — Other Ambulatory Visit: Payer: Self-pay

## 2019-02-03 ENCOUNTER — Ambulatory Visit (INDEPENDENT_AMBULATORY_CARE_PROVIDER_SITE_OTHER): Payer: Medicare HMO

## 2019-02-03 DIAGNOSIS — Z0000 Encounter for general adult medical examination without abnormal findings: Secondary | ICD-10-CM

## 2019-02-03 NOTE — Progress Notes (Signed)
No charge. Patient was unable to complete encounter.

## 2019-02-06 ENCOUNTER — Ambulatory Visit: Payer: Medicare HMO

## 2019-02-10 ENCOUNTER — Encounter: Payer: Self-pay | Admitting: Family Medicine

## 2019-02-10 ENCOUNTER — Ambulatory Visit (INDEPENDENT_AMBULATORY_CARE_PROVIDER_SITE_OTHER): Payer: Medicare HMO | Admitting: Family Medicine

## 2019-02-10 ENCOUNTER — Ambulatory Visit: Payer: Medicare HMO | Admitting: Family Medicine

## 2019-02-10 ENCOUNTER — Other Ambulatory Visit: Payer: Self-pay

## 2019-02-10 VITALS — BP 136/77 | HR 92 | Ht 62.5 in | Wt 130.0 lb

## 2019-02-10 VITALS — BP 136/77 | HR 92 | Wt 130.0 lb

## 2019-02-10 DIAGNOSIS — R7303 Prediabetes: Secondary | ICD-10-CM

## 2019-02-10 DIAGNOSIS — Z0000 Encounter for general adult medical examination without abnormal findings: Secondary | ICD-10-CM | POA: Diagnosis not present

## 2019-02-10 DIAGNOSIS — E7800 Pure hypercholesterolemia, unspecified: Secondary | ICD-10-CM | POA: Diagnosis not present

## 2019-02-10 DIAGNOSIS — M949 Disorder of cartilage, unspecified: Secondary | ICD-10-CM

## 2019-02-10 DIAGNOSIS — I10 Essential (primary) hypertension: Secondary | ICD-10-CM | POA: Diagnosis not present

## 2019-02-10 DIAGNOSIS — M899 Disorder of bone, unspecified: Secondary | ICD-10-CM

## 2019-02-10 MED ORDER — ATORVASTATIN CALCIUM 10 MG PO TABS: 10.0000 mg | ORAL_TABLET | Freq: Every day | ORAL | 3 refills | Status: DC

## 2019-02-10 MED ORDER — LISINOPRIL 10 MG PO TABS: 10.0000 mg | ORAL_TABLET | Freq: Every day | ORAL | 0 refills | Status: DC

## 2019-02-10 NOTE — Progress Notes (Signed)
Virtual Visit via Video Note  I connected with [name] on 02/10/19 at  2:30 PM EDT by a video enabled telemedicine application and verified that I am speaking with the correct person using two identifiers.  Location: Patient: at home Provider: in the office  Video quality is good    I discussed the limitations of evaluation and management by telemedicine and the availability of in person appointments. The patient expressed understanding and agreed to proceed.  History of Present Illness: Patient is here for amw and review of chronic medical problems  I have personally reviewed the Medicare Annual Wellness questionnaire and have noted 1. The patient's medical and social history 2. Their use of alcohol, tobacco or illicit drugs 3. Their current medications and supplements 4. The patient's functional ability including ADL's, fall risks, home safety risks and hearing or visual             impairment. 5. Diet and physical activities 6. Evidence for depression or mood disorders  The patients weight, height, BMI have been recorded in the chart and visual acuity is per eye clinic.  I have made referrals, counseling and provided education to the patient based review of the above and I have provided the pt with a written personalized care plan for preventive services. Reviewed and updated provider list, see scanned forms.  See scanned forms.  Routine anticipatory guidance given to patient.  See health maintenance. Colon cancer screening 2015 nl colonoscopy  Breast cancer screening- due for mammogram 7/20 Self breast exam-no lumps  Flu vaccine 9/19 Tetanus vaccine 4/12 Pneumovax 9/16-up to date  Zoster vaccine - zostavax 2/13 dexa 5/18 with stable osteopenia  Exercises-fit for life / now outdoor walking  No falls or fractures  Takes ca and D   Depression screen =score 0 Cognitive function addressed- see scanned forms- and if abnormal then additional documentation follows.  No  changes   Vision no changes  Hearing no changes   Has living will and POA (her POA is her husband) , daughter would be 2nd  PMH and SH reviewed  Meds, vitals, and allergies reviewed.   ROS: See HPI.  Otherwise negative.    Wt Readings from Last 3 Encounters:  02/10/19 130 lb (59 kg)  08/26/18 136 lb 8 oz (61.9 kg)  02/01/18 140 lb 4 oz (63.6 kg)  trying not to gain weight- careful diet  bmi 23.4  bp is stable today  No cp or palpitations or headaches or edema  No side effects to medicines  BP Readings from Last 3 Encounters:  02/10/19 136/77  02/10/19 136/77  08/26/18 140/76    2nd check this time 112/62  Takes lisinopril   Due for labs- will set up drive through labs    Hyperlipidemia Lab Results  Component Value Date   CHOL 147 01/29/2018   HDL 43.70 01/29/2018   LDLCALC 85 01/29/2018   TRIG 94.0 01/29/2018   CHOLHDL 3 01/29/2018   Due for labs  Taking atorvastatin and eating well   Prediabetes Lab Results  Component Value Date   HGBA1C 5.9 01/29/2018  due for labs   Running after young great grand kids a lot  Stays very active   Review of Systems  Constitutional: Negative for chills, fever and malaise/fatigue.  HENT: Positive for hearing loss. Negative for ear discharge and ear pain.   Eyes: Negative for blurred vision.  Respiratory: Negative for cough and shortness of breath.   Cardiovascular: Negative for chest pain, palpitations  and leg swelling.  Gastrointestinal: Negative for constipation, heartburn and nausea.  Genitourinary: Negative for dysuria.  Musculoskeletal: Negative for myalgias.       Back pain is much better sciatica)  Skin: Negative for itching and rash.  Neurological: Negative for dizziness and headaches.  Psychiatric/Behavioral: Negative for depression and memory loss. The patient is not nervous/anxious.         Patient Active Problem List   Diagnosis Date Noted  . Hyperkalemia 02/01/2018  . Screening mammogram, encounter  for 01/19/2017  . Estrogen deficiency 01/19/2017  . Right knee pain 11/09/2015  . Left shoulder pain 11/09/2015  . Routine general medical examination at a health care facility 06/11/2015  . Encounter for Medicare annual wellness exam 11/14/2013  . Dermatitis 11/14/2013  . Family history of colon cancer 11/14/2013  . Colon cancer screening 11/14/2013  . Right hip pain 01/31/2013  . Hypertension 01/27/2011  . Post-menopausal 01/27/2011  . Low back pain radiating to left leg 03/13/2008  . Hyperlipidemia 12/20/2007  . CONSTIPATION, CHRONIC 12/20/2007  . Disorder of bone and cartilage 12/20/2007  . Prediabetes 12/20/2007   Past Medical History:  Diagnosis Date  . Back pain   . Chronic constipation   . Hyperlipidemia   . Hypertension   . Osteopenia    Past Surgical History:  Procedure Laterality Date  . CLEFT PALATE REPAIR    . FOOT NEUROMA SURGERY  2/06  . NEPHRECTOMY LIVING DONOR     Left  . TUBAL LIGATION     Social History   Tobacco Use  . Smoking status: Never Smoker  . Smokeless tobacco: Never Used  . Tobacco comment: smoked 1 year after high school, never since  Substance Use Topics  . Alcohol use: No    Alcohol/week: 0.0 standard drinks  . Drug use: No   Family History  Problem Relation Age of Onset  . Colon cancer Mother   . Diabetes Mother   . Coronary artery disease Mother   . Osteoporosis Mother   . Dementia Mother   . Coronary artery disease Father   . Heart failure Father   . Hypertension Father   . Stomach cancer Neg Hx    Allergies  Allergen Reactions  . Codeine   . Hydrocodone-Homatropine     Caused flu-like symptoms   Current Outpatient Medications on File Prior to Visit  Medication Sig Dispense Refill  . albuterol (PROVENTIL HFA;VENTOLIN HFA) 108 (90 Base) MCG/ACT inhaler Inhale 2 puffs into the lungs every 4 (four) hours as needed for wheezing or shortness of breath (cough, shortness of breath or wheezing.). 1 Inhaler 1  . aspirin 81 MG  tablet Take 81 mg by mouth daily.    Marland Kitchen atorvastatin (LIPITOR) 10 MG tablet TAKE 1 TABLET(10 MG) BY MOUTH DAILY 90 tablet 0  . bisacodyl (DULCOLAX) 5 MG EC tablet Take 5 mg by mouth daily as needed.      . Cholecalciferol (VITAMIN D3 PO) Take 1 capsule by mouth daily.    . Glucosamine HCl (GLUCOSAMINE PO) Take 1 tablet by mouth daily.    Marland Kitchen lisinopril (PRINIVIL,ZESTRIL) 10 MG tablet TAKE 1 TABLET BY MOUTH EVERY DAY 90 tablet 0  . methocarbamol (ROBAXIN) 500 MG tablet Take 1 tablet (500 mg total) by mouth every 8 (eight) hours as needed for muscle spasms (back pain). Caution of sedation 30 tablet 0  . Multiple Vitamins-Minerals (CENTRUM SILVER) tablet Take 1 tablet by mouth daily.       No current facility-administered medications  on file prior to visit.     Observations/Objective: Patient is well appearing  Looks like her usual self  No facial swelling or asymmetry  No hoarseness or slurred speech  Mentally sharp  Mood is good/normal affect No obvious skin changes or rash  Pt had good rom of limbs and walked well while on the phone    Assessment and Plan: Problem List Items Addressed This Visit      Cardiovascular and Mediastinum   Hypertension    bp in fair control at this time  BP Readings from Last 1 Encounters:  02/10/19 136/77   No changes needed Labs ordered  Disc lifstyle change with low sodium diet and exercise          Musculoskeletal and Integument   Disorder of bone and cartilage    Due for dexa (2y) but cannot schedule it yet due to the pandemic  Pt aware  No falls or fx Taking ca and D Active/exercising          Other   Hyperlipidemia    Disc goals for lipids and reasons to control them Rev last labs with pt Rev low sat fat diet in detail Due for a lipid profile-will order future labs for drive through  Continues atorvastatin       Prediabetes    Due for A1C Has lost 6 lb with healthy diet and exercise  disc imp of low glycemic diet and wt loss  to prevent DM2  Will plan fasting labs /drive through       Encounter for Medicare annual wellness exam - Primary    Doing well Due for mammogram in July-pt will schedule Have to hold off on dexa order due to the pandemic  No red flags/gaps Had zostavax-but is also a candidate for shingrix when available at her pharmacy Due for labs for her chronic medications Pt has advance directive No vision problems and declines hearing aides at this time  Eating healthy and exercising  Taking ca and D            Follow Up Instructions:    I discussed the assessment and treatment plan with the patient. The patient was provided an opportunity to ask questions and all were answered. The patient agreed with the plan and demonstrated an understanding of the instructions.   The patient was advised to call back or seek an in-person evaluation if the symptoms worsen or if the condition fails to improve as anticipated.     Loura Pardon, MD

## 2019-02-10 NOTE — Assessment & Plan Note (Signed)
bp in fair control at this time  BP Readings from Last 1 Encounters:  02/10/19 136/77   No changes needed Labs ordered  Disc lifstyle change with low sodium diet and exercise

## 2019-02-10 NOTE — Assessment & Plan Note (Signed)
Doing well Due for mammogram in July-pt will schedule Have to hold off on dexa order due to the pandemic  No red flags/gaps Had zostavax-but is also a candidate for shingrix when available at her pharmacy Due for labs for her chronic medications Pt has advance directive No vision problems and declines hearing aides at this time  Eating healthy and exercising  Taking ca and D

## 2019-02-10 NOTE — Assessment & Plan Note (Signed)
Disc goals for lipids and reasons to control them Rev last labs with pt Rev low sat fat diet in detail Due for a lipid profile-will order future labs for drive through  Continues atorvastatin

## 2019-02-10 NOTE — Assessment & Plan Note (Signed)
Due for A1C Has lost 6 lb with healthy diet and exercise  disc imp of low glycemic diet and wt loss to prevent DM2  Will plan fasting labs /drive through

## 2019-02-10 NOTE — Progress Notes (Signed)
Opened in error -could not reach pt    Observations/Objective:   Assessment and Plan:   Follow Up Instructions:    I discussed the assessment and treatment plan with the patient. The patient was provided an opportunity to ask questions and all were answered. The patient agreed with the plan and demonstrated an understanding of the instructions.   The patient was advised to call back or seek an in-person evaluation if the symptoms worsen or if the condition fails to improve as anticipated.     Loura Pardon, MD

## 2019-02-10 NOTE — Assessment & Plan Note (Signed)
Due for dexa (2y) but cannot schedule it yet due to the pandemic  Pt aware  No falls or fx Taking ca and D Active/exercising

## 2019-02-12 ENCOUNTER — Other Ambulatory Visit (INDEPENDENT_AMBULATORY_CARE_PROVIDER_SITE_OTHER): Payer: Medicare HMO

## 2019-02-12 ENCOUNTER — Other Ambulatory Visit: Payer: Self-pay

## 2019-02-12 DIAGNOSIS — E7800 Pure hypercholesterolemia, unspecified: Secondary | ICD-10-CM | POA: Diagnosis not present

## 2019-02-12 DIAGNOSIS — I10 Essential (primary) hypertension: Secondary | ICD-10-CM

## 2019-02-12 DIAGNOSIS — R7303 Prediabetes: Secondary | ICD-10-CM | POA: Diagnosis not present

## 2019-02-12 LAB — COMPREHENSIVE METABOLIC PANEL
ALT: 14 U/L (ref 0–35)
AST: 20 U/L (ref 0–37)
Albumin: 3.9 g/dL (ref 3.5–5.2)
Alkaline Phosphatase: 86 U/L (ref 39–117)
BUN: 24 mg/dL — ABNORMAL HIGH (ref 6–23)
CO2: 28 mEq/L (ref 19–32)
Calcium: 9 mg/dL (ref 8.4–10.5)
Chloride: 105 mEq/L (ref 96–112)
Creatinine, Ser: 1.05 mg/dL (ref 0.40–1.20)
GFR: 50.89 mL/min — ABNORMAL LOW (ref >60.00)
Glucose, Bld: 105 mg/dL — ABNORMAL HIGH (ref 70–99)
Potassium: 5.5 mEq/L — ABNORMAL HIGH (ref 3.5–5.1)
Sodium: 138 mEq/L (ref 135–145)
Total Bilirubin: 0.3 mg/dL (ref 0.2–1.2)
Total Protein: 6.6 g/dL (ref 6.0–8.3)

## 2019-02-12 LAB — CBC WITH DIFFERENTIAL/PLATELET
Basophils Absolute: 0 10*3/uL (ref 0.0–0.1)
Basophils Relative: 0.6 % (ref 0.0–3.0)
Eosinophils Absolute: 0.3 10*3/uL (ref 0.0–0.7)
Eosinophils Relative: 4 % (ref 0.0–5.0)
HCT: 39 % (ref 36.0–46.0)
Hemoglobin: 13.1 g/dL (ref 12.0–15.0)
Lymphocytes Relative: 20.6 % (ref 12.0–46.0)
Lymphs Abs: 1.4 10*3/uL (ref 0.7–4.0)
MCHC: 33.6 g/dL (ref 30.0–36.0)
MCV: 94.4 fl (ref 78.0–100.0)
Monocytes Absolute: 0.3 10*3/uL (ref 0.1–1.0)
Monocytes Relative: 4.5 % (ref 3.0–12.0)
Neutro Abs: 4.6 10*3/uL (ref 1.4–7.7)
Neutrophils Relative %: 70.3 % (ref 43.0–77.0)
Platelets: 205 10*3/uL (ref 150.0–400.0)
RBC: 4.13 Mil/uL (ref 3.87–5.11)
RDW: 12.8 % (ref 11.5–15.5)
WBC: 6.6 10*3/uL (ref 4.0–10.5)

## 2019-02-12 LAB — LIPID PANEL
Cholesterol: 159 mg/dL (ref 0–200)
HDL: 49.8 mg/dL (ref >39.00)
LDL Cholesterol: 87 mg/dL (ref 0–99)
NonHDL: 108.82
Total CHOL/HDL Ratio: 3
Triglycerides: 111 mg/dL (ref 0.0–149.0)
VLDL: 22.2 mg/dL (ref 0.0–40.0)

## 2019-02-12 LAB — TSH: TSH: 2.29 u[IU]/mL (ref 0.35–4.50)

## 2019-02-12 LAB — HEMOGLOBIN A1C: Hgb A1c MFr Bld: 6 % (ref 4.6–6.5)

## 2019-02-13 ENCOUNTER — Telehealth: Payer: Self-pay | Admitting: *Deleted

## 2019-02-13 NOTE — Telephone Encounter (Signed)
Left VM requesting pt to call the office regarding lab results

## 2019-02-14 NOTE — Telephone Encounter (Signed)
Left 2nd VM requesting pt to call the office back  

## 2019-02-25 NOTE — Telephone Encounter (Signed)
Addressed through result notes  

## 2019-03-04 ENCOUNTER — Telehealth: Payer: Self-pay | Admitting: Family Medicine

## 2019-03-04 DIAGNOSIS — E875 Hyperkalemia: Secondary | ICD-10-CM

## 2019-03-04 NOTE — Telephone Encounter (Signed)
-----   Message from Cloyd Stagers, RT sent at 02/27/2019  1:25 PM EDT ----- Regarding: Lab orders for Thursday 5.28.2020 Please place lab orders for Thursday 5.28.2020 Thank you, Dyke Maes RT(R)

## 2019-03-06 ENCOUNTER — Telehealth: Payer: Self-pay | Admitting: *Deleted

## 2019-03-06 ENCOUNTER — Other Ambulatory Visit (INDEPENDENT_AMBULATORY_CARE_PROVIDER_SITE_OTHER): Payer: Medicare HMO

## 2019-03-06 DIAGNOSIS — E875 Hyperkalemia: Secondary | ICD-10-CM

## 2019-03-06 LAB — BASIC METABOLIC PANEL
BUN: 18 mg/dL (ref 6–23)
CO2: 28 mEq/L (ref 19–32)
Calcium: 9 mg/dL (ref 8.4–10.5)
Chloride: 106 mEq/L (ref 96–112)
Creatinine, Ser: 1.07 mg/dL (ref 0.40–1.20)
GFR: 49.78 mL/min — ABNORMAL LOW (ref >60.00)
Glucose, Bld: 101 mg/dL — ABNORMAL HIGH (ref 70–99)
Potassium: 4.9 mEq/L (ref 3.5–5.1)
Sodium: 140 mEq/L (ref 135–145)

## 2019-03-06 NOTE — Telephone Encounter (Signed)
Left VM requesting pt to call back regarding her lab results

## 2019-03-07 DIAGNOSIS — S46212A Strain of muscle, fascia and tendon of other parts of biceps, left arm, initial encounter: Secondary | ICD-10-CM | POA: Diagnosis not present

## 2019-03-07 DIAGNOSIS — M25512 Pain in left shoulder: Secondary | ICD-10-CM | POA: Diagnosis not present

## 2019-03-12 MED ORDER — LISINOPRIL 5 MG PO TABS: 5.0000 mg | ORAL_TABLET | Freq: Every day | ORAL | 2 refills | Status: DC

## 2019-03-12 NOTE — Telephone Encounter (Signed)
-----   Message from Abner Greenspan, MD sent at 03/06/2019 12:31 PM EDT ----- K is better Continue 1/2 of lisinopril pill daily and please change on her med list Thanks

## 2019-03-12 NOTE — Telephone Encounter (Signed)
Pt did view results on mychart. She has been taking 1/2 tab of 10mg  of lisinopril but it's really hard to cut the pill in half and she requested we just send in a 5 mg tablet. Rx sent and pt aware

## 2019-07-09 DIAGNOSIS — R69 Illness, unspecified: Secondary | ICD-10-CM | POA: Diagnosis not present

## 2019-07-09 DIAGNOSIS — M79645 Pain in left finger(s): Secondary | ICD-10-CM | POA: Diagnosis not present

## 2019-07-17 DIAGNOSIS — Z7982 Long term (current) use of aspirin: Secondary | ICD-10-CM | POA: Diagnosis not present

## 2019-07-17 DIAGNOSIS — I10 Essential (primary) hypertension: Secondary | ICD-10-CM | POA: Diagnosis not present

## 2019-07-17 DIAGNOSIS — E785 Hyperlipidemia, unspecified: Secondary | ICD-10-CM | POA: Diagnosis not present

## 2019-07-17 DIAGNOSIS — R32 Unspecified urinary incontinence: Secondary | ICD-10-CM | POA: Diagnosis not present

## 2019-07-31 DIAGNOSIS — H43813 Vitreous degeneration, bilateral: Secondary | ICD-10-CM | POA: Diagnosis not present

## 2019-07-31 DIAGNOSIS — H25813 Combined forms of age-related cataract, bilateral: Secondary | ICD-10-CM | POA: Diagnosis not present

## 2019-07-31 DIAGNOSIS — H35371 Puckering of macula, right eye: Secondary | ICD-10-CM | POA: Diagnosis not present

## 2019-07-31 DIAGNOSIS — H5213 Myopia, bilateral: Secondary | ICD-10-CM | POA: Diagnosis not present

## 2019-09-12 ENCOUNTER — Other Ambulatory Visit: Payer: Self-pay

## 2019-09-12 DIAGNOSIS — Z20822 Contact with and (suspected) exposure to covid-19: Secondary | ICD-10-CM

## 2019-09-15 LAB — NOVEL CORONAVIRUS, NAA: SARS-CoV-2, NAA: NOT DETECTED

## 2019-09-24 DIAGNOSIS — R69 Illness, unspecified: Secondary | ICD-10-CM | POA: Diagnosis not present

## 2019-10-24 DIAGNOSIS — L821 Other seborrheic keratosis: Secondary | ICD-10-CM | POA: Diagnosis not present

## 2019-10-24 DIAGNOSIS — D224 Melanocytic nevi of scalp and neck: Secondary | ICD-10-CM | POA: Diagnosis not present

## 2019-10-24 DIAGNOSIS — L814 Other melanin hyperpigmentation: Secondary | ICD-10-CM | POA: Diagnosis not present

## 2019-10-24 DIAGNOSIS — L570 Actinic keratosis: Secondary | ICD-10-CM | POA: Diagnosis not present

## 2019-10-24 DIAGNOSIS — L918 Other hypertrophic disorders of the skin: Secondary | ICD-10-CM | POA: Diagnosis not present

## 2019-10-24 DIAGNOSIS — D1801 Hemangioma of skin and subcutaneous tissue: Secondary | ICD-10-CM | POA: Diagnosis not present

## 2019-10-28 ENCOUNTER — Other Ambulatory Visit: Payer: Self-pay | Admitting: *Deleted

## 2019-10-28 DIAGNOSIS — Z20822 Contact with and (suspected) exposure to covid-19: Secondary | ICD-10-CM | POA: Diagnosis not present

## 2019-10-29 LAB — NOVEL CORONAVIRUS, NAA: SARS-CoV-2, NAA: NOT DETECTED

## 2019-10-31 ENCOUNTER — Ambulatory Visit: Payer: Medicare HMO | Attending: Family Medicine

## 2019-10-31 DIAGNOSIS — Z23 Encounter for immunization: Secondary | ICD-10-CM | POA: Insufficient documentation

## 2019-10-31 NOTE — Progress Notes (Signed)
   Covid-19 Vaccination Clinic  Name:  XZAVIA FELTY    MRN: [id number] DOB: 03-08-1941  10/31/2019  Ms. Everhart was observed post Covid-19 immunization for 15 minutes without incidence. She was provided with Vaccine Information Sheet and instruction to access the V-Safe system.   Ms. Schall was instructed to call 911 with any severe reactions post vaccine: Marland Kitchen Difficulty breathing  . Swelling of your face and throat  . A fast heartbeat  . A bad rash all over your body  . Dizziness and weakness    Immunizations Administered    Name Date Dose VIS Date Route   Pfizer COVID-19 Vaccine 10/31/2019  1:11 PM 0.3 mL 09/19/2019 Intramuscular   Manufacturer: [place]   Lot: [id number]   [place]: [id number]

## 2019-11-13 ENCOUNTER — Other Ambulatory Visit: Payer: Self-pay | Admitting: Family Medicine

## 2019-11-13 DIAGNOSIS — Z1231 Encounter for screening mammogram for malignant neoplasm of breast: Secondary | ICD-10-CM

## 2019-11-22 ENCOUNTER — Ambulatory Visit: Payer: Medicare HMO | Attending: Internal Medicine

## 2019-11-22 DIAGNOSIS — Z23 Encounter for immunization: Secondary | ICD-10-CM

## 2019-11-22 NOTE — Progress Notes (Signed)
   Covid-19 Vaccination Clinic  Name:  JESSEL PUCCIARELLI    MRN: [id number] DOB: 1941-03-27  11/22/2019  Ms. Braly was observed post Covid-19 immunization for 15 minutes without incidence. She was provided with Vaccine Information Sheet and instruction to access the V-Safe system.   Ms. Pellicane was instructed to call 911 with any severe reactions post vaccine: Marland Kitchen Difficulty breathing  . Swelling of your face and throat  . A fast heartbeat  . A bad rash all over your body  . Dizziness and weakness    Immunizations Administered    Name Date Dose VIS Date Route   Pfizer COVID-19 Vaccine 11/22/2019 11:17 AM 0.3 mL 09/19/2019 Intramuscular   Manufacturer: [place]   Lot: [id number]   [place]: [id number]

## 2019-12-15 ENCOUNTER — Other Ambulatory Visit: Payer: Self-pay | Admitting: Family Medicine

## 2019-12-16 ENCOUNTER — Other Ambulatory Visit: Payer: Self-pay

## 2019-12-16 ENCOUNTER — Ambulatory Visit
Admission: RE | Admit: 2019-12-16 | Discharge: 2019-12-16 | Disposition: A | Discharge status: Home / Self Care | Payer: Medicare HMO | Source: Ambulatory Visit | Attending: Family Medicine | Admitting: Family Medicine

## 2019-12-16 DIAGNOSIS — Z1231 Encounter for screening mammogram for malignant neoplasm of breast: Secondary | ICD-10-CM

## 2020-01-02 ENCOUNTER — Ambulatory Visit (INDEPENDENT_AMBULATORY_CARE_PROVIDER_SITE_OTHER): Payer: Medicare HMO | Admitting: Family Medicine

## 2020-01-02 ENCOUNTER — Other Ambulatory Visit: Payer: Self-pay

## 2020-01-02 ENCOUNTER — Encounter: Payer: Self-pay | Admitting: Family Medicine

## 2020-01-02 DIAGNOSIS — N6489 Other specified disorders of breast: Secondary | ICD-10-CM | POA: Diagnosis not present

## 2020-01-02 NOTE — Assessment & Plan Note (Signed)
Moderate sized hematoma on L lower/medial breast caused by trauma of mammogram No pain  Pt does take asa which inc risk of bleeding Reassured  Looks to be improving  Pt inst that this will likely take quite a while to fade and longer for the lump underneath to go away  Will monitor  She will call if any new symptoms present of if this worsens

## 2020-01-02 NOTE — Patient Instructions (Signed)
You have a hematoma from the trauma of the mammogram   The color will very slowly get better  The lump underneath will likely persist for months   Nothing to do but protect from trauma If pain- let me know

## 2020-01-02 NOTE — Progress Notes (Signed)
Subjective:    Patient ID: [name], female    DOB: 01-10-1941, 79 y.o.   MRN: [id number]  This visit occurred during the SARS-CoV-2 public health emergency.  Safety protocols were in place, including screening questions prior to the visit, additional usage of staff PPE, and extensive cleaning of exam room while observing appropriate contact time as indicated for disinfecting solutions.    HPI Pt presents for hematoma on L breast from her mammogram   She takes 81 mg asa daily   Had her covid vaccines  Also first shingrix vaccine   Had her mammogram on 3/9 Several days later noticed a bruise (quite large)  Taking a long time to go away  Does not hurt     MamMM 3D SCREEN BREAST BILATERAL (Accession [id number]) (Order [id number]) Imaging Date: 12/16/2019 Department: [facility] Released By: [place], [place]: Darica Goren, Wynelle Fanny, MD  Exam Status  Status  Final [99]  PACS Intelerad Image Link  Show images for MM 3D SCREEN BREAST BILATERAL  Study Result  CLINICAL DATA:  Screening.  EXAM: DIGITAL SCREENING BILATERAL MAMMOGRAM WITH TOMO AND CAD  COMPARISON:  Previous exam(s).  ACR Breast Density Category b: There are scattered areas of fibroglandular density.  FINDINGS: There are no findings suspicious for malignancy. Images were processed with CAD.  IMPRESSION: No mammographic evidence of malignancy. A result letter of this screening mammogram will be mailed directly to the patient.  RECOMMENDATION: Screening mammogram in one year. (Code:[id number]-B-01Y)  BI-RADS CATEGORY  1: Negative.   Electronically Signed   By: Lajean Manes M.D.   On: 12/17/2019 08:41   Patient Active Problem List   Diagnosis Date Noted  . Hematoma of left breast 01/02/2020  . Hyperkalemia 02/01/2018  . Screening mammogram, encounter for 01/19/2017  . Estrogen deficiency 01/19/2017  . Routine general medical examination at a health care  facility 06/11/2015  . Encounter for Medicare annual wellness exam 11/14/2013  . Dermatitis 11/14/2013  . Family history of colon cancer 11/14/2013  . Colon cancer screening 11/14/2013  . Hypertension 01/27/2011  . Post-menopausal 01/27/2011  . Low back pain radiating to left leg 03/13/2008  . Hyperlipidemia 12/20/2007  . CONSTIPATION, CHRONIC 12/20/2007  . Disorder of bone and cartilage 12/20/2007  . Prediabetes 12/20/2007   Past Medical History:  Diagnosis Date  . Back pain   . Chronic constipation   . Hyperlipidemia   . Hypertension   . Osteopenia    Past Surgical History:  Procedure Laterality Date  . CLEFT PALATE REPAIR    . FOOT NEUROMA SURGERY  2/06  . NEPHRECTOMY LIVING DONOR     Left  . TUBAL LIGATION     Social History   Tobacco Use  . Smoking status: Never Smoker  . Smokeless tobacco: Never Used  . Tobacco comment: smoked 1 year after high school, never since  Substance Use Topics  . Alcohol use: No    Alcohol/week: 0.0 standard drinks  . Drug use: No   Family History  Problem Relation Age of Onset  . Colon cancer Mother   . Diabetes Mother   . Coronary artery disease Mother   . Osteoporosis Mother   . Dementia Mother   . Coronary artery disease Father   . Heart failure Father   . Hypertension Father   . Stomach cancer Neg Hx    Allergies  Allergen Reactions  . Codeine   . Hydrocodone-Homatropine     Caused flu-like symptoms  Current Outpatient Medications on File Prior to Visit  Medication Sig Dispense Refill  . albuterol (PROVENTIL HFA;VENTOLIN HFA) 108 (90 Base) MCG/ACT inhaler Inhale 2 puffs into the lungs every 4 (four) hours as needed for wheezing or shortness of breath (cough, shortness of breath or wheezing.). 1 Inhaler 1  . aspirin 81 MG tablet Take 81 mg by mouth daily.    Marland Kitchen atorvastatin (LIPITOR) 10 MG tablet Take 1 tablet (10 mg total) by mouth daily. 90 tablet 3  . bisacodyl (DULCOLAX) 5 MG EC tablet Take 5 mg by mouth daily as  needed.      . Cholecalciferol (VITAMIN D3 PO) Take 1 capsule by mouth daily.    . Glucosamine HCl (GLUCOSAMINE PO) Take 1 tablet by mouth daily.    Marland Kitchen lisinopril (ZESTRIL) 5 MG tablet TAKE 1 TABLET BY MOUTH EVERY DAY 90 tablet 0  . methocarbamol (ROBAXIN) 500 MG tablet Take 1 tablet (500 mg total) by mouth every 8 (eight) hours as needed for muscle spasms (back pain). Caution of sedation 30 tablet 0  . Multiple Vitamins-Minerals (CENTRUM SILVER) tablet Take 1 tablet by mouth daily.       No current facility-administered medications on file prior to visit.      Review of Systems  Constitutional: Negative for activity change, appetite change, fatigue, fever and unexpected weight change.  HENT: Negative for congestion, ear pain, rhinorrhea, sinus pressure and sore throat.   Eyes: Negative for pain, redness and visual disturbance.  Respiratory: Negative for cough, shortness of breath and wheezing.   Cardiovascular: Negative for chest pain and palpitations.  Gastrointestinal: Negative for abdominal pain, blood in stool, constipation and diarrhea.  Endocrine: Negative for polydipsia and polyuria.  Genitourinary: Negative for dysuria, frequency and urgency.  Musculoskeletal: Negative for arthralgias, back pain and myalgias.  Skin: Negative for pallor and rash.       Bruise on breast  Allergic/Immunologic: Negative for environmental allergies.  Neurological: Negative for dizziness, syncope and headaches.  Hematological: Negative for adenopathy. Does not bruise/bleed easily.  Psychiatric/Behavioral: Negative for decreased concentration and dysphoric mood. The patient is not nervous/anxious.        Objective:   Physical Exam Constitutional:      General: She is not in acute distress.    Appearance: Normal appearance. She is normal weight. She is not ill-appearing.  Eyes:     General: No scleral icterus.    Conjunctiva/sclera: Conjunctivae normal.     Pupils: Pupils are equal, round, and  reactive to light.  Cardiovascular:     Rate and Rhythm: Regular rhythm. Tachycardia present.     Pulses: Normal pulses.     Heart sounds: Normal heart sounds.  Pulmonary:     Effort: Pulmonary effort is normal.     Breath sounds: Normal breath sounds.  Genitourinary:    Comments: L breast :  5-6 cm area of ecchymosis (purple in middle and yellow on edges) with small (1 cm) lump underneath consistent with a hematoma in lower/medial quadrant of breast  Not tender  No other findings Musculoskeletal:     Cervical back: Normal range of motion and neck supple.  Lymphadenopathy:     Cervical: No cervical adenopathy.  Skin:    General: Skin is warm and dry.     Findings: Bruising present. No rash.     Comments: L breast   Neurological:     Mental Status: She is alert.     Sensory: No sensory deficit.  Psychiatric:  Mood and Affect: Mood normal.           Assessment & Plan:   Problem List Items Addressed This Visit      Other   Hematoma of left breast    Moderate sized hematoma on L lower/medial breast caused by trauma of mammogram No pain  Pt does take asa which inc risk of bleeding Reassured  Looks to be improving  Pt inst that this will likely take quite a while to fade and longer for the lump underneath to go away  Will monitor  She will call if any new symptoms present of if this worsens

## 2020-01-08 DIAGNOSIS — S82401A Unspecified fracture of shaft of right fibula, initial encounter for closed fracture: Secondary | ICD-10-CM | POA: Insufficient documentation

## 2020-01-08 DIAGNOSIS — M25571 Pain in right ankle and joints of right foot: Secondary | ICD-10-CM | POA: Insufficient documentation

## 2020-01-08 DIAGNOSIS — S82424A Nondisplaced transverse fracture of shaft of right fibula, initial encounter for closed fracture: Secondary | ICD-10-CM | POA: Diagnosis not present

## 2020-01-08 DIAGNOSIS — M79671 Pain in right foot: Secondary | ICD-10-CM | POA: Diagnosis not present

## 2020-01-21 DIAGNOSIS — M25571 Pain in right ankle and joints of right foot: Secondary | ICD-10-CM | POA: Diagnosis not present

## 2020-01-21 DIAGNOSIS — S82424D Nondisplaced transverse fracture of shaft of right fibula, subsequent encounter for closed fracture with routine healing: Secondary | ICD-10-CM | POA: Diagnosis not present

## 2020-02-11 DIAGNOSIS — S82424D Nondisplaced transverse fracture of shaft of right fibula, subsequent encounter for closed fracture with routine healing: Secondary | ICD-10-CM | POA: Diagnosis not present

## 2020-02-11 DIAGNOSIS — M25571 Pain in right ankle and joints of right foot: Secondary | ICD-10-CM | POA: Diagnosis not present

## 2020-02-16 ENCOUNTER — Telehealth: Payer: Self-pay | Admitting: Family Medicine

## 2020-02-16 DIAGNOSIS — R7303 Prediabetes: Secondary | ICD-10-CM

## 2020-02-16 DIAGNOSIS — E7800 Pure hypercholesterolemia, unspecified: Secondary | ICD-10-CM

## 2020-02-16 DIAGNOSIS — I10 Essential (primary) hypertension: Secondary | ICD-10-CM

## 2020-02-16 NOTE — Telephone Encounter (Signed)
-----   Message from Ellamae Sia sent at 02/04/2020  3:01 PM EDT ----- Regarding: Lab orders for Tuesday, 5.11.21  AWV lab orders, please.

## 2020-02-17 ENCOUNTER — Other Ambulatory Visit (INDEPENDENT_AMBULATORY_CARE_PROVIDER_SITE_OTHER): Payer: Medicare HMO

## 2020-02-17 ENCOUNTER — Telehealth: Payer: Self-pay

## 2020-02-17 ENCOUNTER — Ambulatory Visit (INDEPENDENT_AMBULATORY_CARE_PROVIDER_SITE_OTHER): Payer: Medicare HMO

## 2020-02-17 VITALS — Wt 135.0 lb

## 2020-02-17 DIAGNOSIS — E7800 Pure hypercholesterolemia, unspecified: Secondary | ICD-10-CM | POA: Diagnosis not present

## 2020-02-17 DIAGNOSIS — I10 Essential (primary) hypertension: Secondary | ICD-10-CM

## 2020-02-17 DIAGNOSIS — R7303 Prediabetes: Secondary | ICD-10-CM | POA: Diagnosis not present

## 2020-02-17 DIAGNOSIS — Z0000 Encounter for general adult medical examination without abnormal findings: Secondary | ICD-10-CM | POA: Diagnosis not present

## 2020-02-17 LAB — CBC WITH DIFFERENTIAL/PLATELET
Basophils Absolute: 0 10*3/uL (ref 0.0–0.1)
Basophils Relative: 0.5 % (ref 0.0–3.0)
Eosinophils Absolute: 0.3 10*3/uL (ref 0.0–0.7)
Eosinophils Relative: 4.5 % (ref 0.0–5.0)
HCT: 37.5 % (ref 36.0–46.0)
Hemoglobin: 12.8 g/dL (ref 12.0–15.0)
Lymphocytes Relative: 23.3 % (ref 12.0–46.0)
Lymphs Abs: 1.5 10*3/uL (ref 0.7–4.0)
MCHC: 34.2 g/dL (ref 30.0–36.0)
MCV: 92.6 fl (ref 78.0–100.0)
Monocytes Absolute: 0.4 10*3/uL (ref 0.1–1.0)
Monocytes Relative: 5.6 % (ref 3.0–12.0)
Neutro Abs: 4.4 10*3/uL (ref 1.4–7.7)
Neutrophils Relative %: 66.1 % (ref 43.0–77.0)
Platelets: 217 10*3/uL (ref 150.0–400.0)
RBC: 4.05 Mil/uL (ref 3.87–5.11)
RDW: 12.5 % (ref 11.5–15.5)
WBC: 6.6 10*3/uL (ref 4.0–10.5)

## 2020-02-17 LAB — COMPREHENSIVE METABOLIC PANEL
ALT: 14 U/L (ref 0–35)
AST: 18 U/L (ref 0–37)
Albumin: 4.1 g/dL (ref 3.5–5.2)
Alkaline Phosphatase: 85 U/L (ref 39–117)
BUN: 18 mg/dL (ref 6–23)
CO2: 29 mEq/L (ref 19–32)
Calcium: 9.5 mg/dL (ref 8.4–10.5)
Chloride: 106 mEq/L (ref 96–112)
Creatinine, Ser: 1 mg/dL (ref 0.40–1.20)
GFR: 53.69 mL/min — ABNORMAL LOW (ref >60.00)
Glucose, Bld: 110 mg/dL — ABNORMAL HIGH (ref 70–99)
Potassium: 5.1 mEq/L (ref 3.5–5.1)
Sodium: 140 mEq/L (ref 135–145)
Total Bilirubin: 0.3 mg/dL (ref 0.2–1.2)
Total Protein: 6.7 g/dL (ref 6.0–8.3)

## 2020-02-17 LAB — LIPID PANEL
Cholesterol: 172 mg/dL (ref 0–200)
HDL: 41.9 mg/dL (ref >39.00)
LDL Cholesterol: 97 mg/dL (ref 0–99)
NonHDL: 130.51
Total CHOL/HDL Ratio: 4
Triglycerides: 170 mg/dL — ABNORMAL HIGH (ref 0.0–149.0)
VLDL: 34 mg/dL (ref 0.0–40.0)

## 2020-02-17 LAB — HEMOGLOBIN A1C: Hgb A1c MFr Bld: 6 % (ref 4.6–6.5)

## 2020-02-17 LAB — TSH: TSH: 2.67 u[IU]/mL (ref 0.35–4.50)

## 2020-02-17 NOTE — Telephone Encounter (Signed)
[name], with [facility], l/m stating she was following up on a fax she sent over on 02/04/20 to Dr Glori Bickers to suggest ordering Bone Density test and to have this done before the end of September. If any questions for [facility] her direct line is [phone]  Patient does have Physical with Dr Glori Bickers on 02/20/20

## 2020-02-17 NOTE — Progress Notes (Addendum)
PCP notes:  Health Maintenance: No gaps noted   Abnormal Screenings: none   Patient concerns: none   Nurse concerns: none   Next PCP appt.: 02/20/2020 @ 8:30 am   I reviewed health advisor's note, was available for consultation, and agree with documentation and plan. Loura Pardon MD

## 2020-02-17 NOTE — Telephone Encounter (Signed)
We will discuss this with her at her physical

## 2020-02-17 NOTE — Patient Instructions (Signed)
[name] , Thank you for taking time to come for your Medicare Wellness Visit. I appreciate your ongoing commitment to your health goals. Please review the following plan we discussed and let me know if I can assist you in the future.   Screening recommendations/referrals: Colonoscopy: Up to date, completed 01/02/2014 Mammogram: Up to date, completed 12/16/2019 Bone Density: completed 02/26/2017 Recommended yearly ophthalmology/optometry visit for glaucoma screening and checkup Recommended yearly dental visit for hygiene and checkup  Vaccinations: Influenza vaccine: Up to date, completed 07/09/2019 Pneumococcal vaccine: Completed series Tdap vaccine: Up to date, completed 01/27/2011 Shingles vaccine: administered 07/23/2019    Advanced directives: Please bring a copy of your POA (Power of [place]) and/or Living Will to your next appointment.   Conditions/risks identified: hypertension, hyperlipidemia  Next appointment: 02/20/2020 @ 8:30 am    Preventive Care 65 Years and Older, Female Preventive care refers to lifestyle choices and visits with your health care provider that can promote health and wellness. What does preventive care include?  A yearly physical exam. This is also called an annual well check.  Dental exams once or twice a year.  Routine eye exams. Ask your health care provider how often you should have your eyes checked.  Personal lifestyle choices, including:  Daily care of your teeth and gums.  Regular physical activity.  Eating a healthy diet.  Avoiding tobacco and drug use.  Limiting alcohol use.  Practicing safe sex.  Taking low-dose aspirin every day.  Taking vitamin and mineral supplements as recommended by your health care provider. What happens during an annual well check? The services and screenings done by your health care provider during your annual well check will depend on your age, overall health, lifestyle risk factors, and family history  of disease. Counseling  Your health care provider may ask you questions about your:  Alcohol use.  Tobacco use.  Drug use.  Emotional well-being.  Home and relationship well-being.  Sexual activity.  Eating habits.  History of falls.  Memory and ability to understand (cognition).  Work and work Statistician.  Reproductive health. Screening  You may have the following tests or measurements:  Height, weight, and BMI.  Blood pressure.  Lipid and cholesterol levels. These may be checked every 5 years, or more frequently if you are over 53 years old.  Skin check.  Lung cancer screening. You may have this screening every year starting at age 60 if you have a 30-pack-year history of smoking and currently smoke or have quit within the past 15 years.  Fecal occult blood test (FOBT) of the stool. You may have this test every year starting at age 78.  Flexible sigmoidoscopy or colonoscopy. You may have a sigmoidoscopy every 5 years or a colonoscopy every 10 years starting at age 36.  Hepatitis C blood test.  Hepatitis B blood test.  Sexually transmitted disease (STD) testing.  Diabetes screening. This is done by checking your blood sugar (glucose) after you have not eaten for a while (fasting). You may have this done every 1-3 years.  Bone density scan. This is done to screen for osteoporosis. You may have this done starting at age 37.  Mammogram. This may be done every 1-2 years. Talk to your health care provider about how often you should have regular mammograms. Talk with your health care provider about your test results, treatment options, and if necessary, the need for more tests. Vaccines  Your health care provider may recommend certain vaccines, such as:  Influenza  vaccine. This is recommended every year.  Tetanus, diphtheria, and acellular pertussis (Tdap, Td) vaccine. You may need a Td booster every 10 years.  Zoster vaccine. You may need this after age 15.   Pneumococcal 13-valent conjugate (PCV13) vaccine. One dose is recommended after age 53.  Pneumococcal polysaccharide (PPSV23) vaccine. One dose is recommended after age 75. Talk to your health care provider about which screenings and vaccines you need and how often you need them. This information is not intended to replace advice given to you by your health care provider. Make sure you discuss any questions you have with your health care provider. Document Released: 10/22/2015 Document Revised: 06/14/2016 Document Reviewed: 07/27/2015 Elsevier Interactive Patient Education  2017 [employer] Prevention in the Home Falls can cause injuries. They can happen to people of all ages. There are many things you can do to make your home safe and to help prevent falls. What can I do on the outside of my home?  Regularly fix the edges of walkways and driveways and fix any cracks.  Remove anything that might make you trip as you walk through a door, such as a raised step or threshold.  Trim any bushes or trees on the path to your home.  Use bright outdoor lighting.  Clear any walking paths of anything that might make someone trip, such as rocks or tools.  Regularly check to see if handrails are loose or broken. Make sure that both sides of any steps have handrails.  Any raised decks and porches should have guardrails on the edges.  Have any leaves, snow, or ice cleared regularly.  Use sand or salt on walking paths during winter.  Clean up any spills in your garage right away. This includes oil or grease spills. What can I do in the bathroom?  Use night lights.  Install grab bars by the toilet and in the tub and shower. Do not use towel bars as grab bars.  Use non-skid mats or decals in the tub or shower.  If you need to sit down in the shower, use a plastic, non-slip stool.  Keep the floor dry. Clean up any water that spills on the floor as soon as it happens.  Remove soap  buildup in the tub or shower regularly.  Attach bath mats securely with double-sided non-slip rug tape.  Do not have throw rugs and other things on the floor that can make you trip. What can I do in the bedroom?  Use night lights.  Make sure that you have a light by your bed that is easy to reach.  Do not use any sheets or blankets that are too big for your bed. They should not hang down onto the floor.  Have a firm chair that has side arms. You can use this for support while you get dressed.  Do not have throw rugs and other things on the floor that can make you trip. What can I do in the kitchen?  Clean up any spills right away.  Avoid walking on wet floors.  Keep items that you use a lot in easy-to-reach places.  If you need to reach something above you, use a strong step stool that has a grab bar.  Keep electrical cords out of the way.  Do not use floor polish or wax that makes floors slippery. If you must use wax, use non-skid floor wax.  Do not have throw rugs and other things on the floor that can  make you trip. What can I do with my stairs?  Do not leave any items on the stairs.  Make sure that there are handrails on both sides of the stairs and use them. Fix handrails that are broken or loose. Make sure that handrails are as long as the stairways.  Check any carpeting to make sure that it is firmly attached to the stairs. Fix any carpet that is loose or worn.  Avoid having throw rugs at the top or bottom of the stairs. If you do have throw rugs, attach them to the floor with carpet tape.  Make sure that you have a light switch at the top of the stairs and the bottom of the stairs. If you do not have them, ask someone to add them for you. What else can I do to help prevent falls?  Wear shoes that:  Do not have high heels.  Have rubber bottoms.  Are comfortable and fit you well.  Are closed at the toe. Do not wear sandals.  If you use a stepladder:  Make  sure that it is fully opened. Do not climb a closed stepladder.  Make sure that both sides of the stepladder are locked into place.  Ask someone to hold it for you, if possible.  Clearly mark and make sure that you can see:  Any grab bars or handrails.  First and last steps.  Where the edge of each step is.  Use tools that help you move around (mobility aids) if they are needed. These include:  Canes.  Walkers.  Scooters.  Crutches.  Turn on the lights when you go into a dark area. Replace any light bulbs as soon as they burn out.  Set up your furniture so you have a clear path. Avoid moving your furniture around.  If any of your floors are uneven, fix them.  If there are any pets around you, be aware of where they are.  Review your medicines with your doctor. Some medicines can make you feel dizzy. This can increase your chance of falling. Ask your doctor what other things that you can do to help prevent falls. This information is not intended to replace advice given to you by your health care provider. Make sure you discuss any questions you have with your health care provider. Document Released: 07/22/2009 Document Revised: 03/02/2016 Document Reviewed: 10/30/2014 Elsevier Interactive Patient Education  2017 [employer].

## 2020-02-17 NOTE — Progress Notes (Addendum)
Subjective:   KALYN SENNA is a 79 y.o. female who presents for Medicare Annual (Subsequent) preventive examination.  Review of Systems: N/A    This visit is being conducted through telemedicine via telephone at the nurse health advisor's home address due to the COVID-19 pandemic. This patient has given me verbal consent via doximity to conduct this visit, patient states they are participating from their home address. Patient and myself are on the telephone call. There is no referral for this visit. Some vital signs may be absent or patient reported.    Patient identification: identified by name, DOB, and current address   Cardiac Risk Factors include: advanced age (>69men, >49 women);hypertension;dyslipidemia     Objective:     Vitals: Wt 135 lb (61.2 kg)   BMI 24.30 kg/m   Body mass index is 24.3 kg/m.  Advanced Directives 02/17/2020 01/29/2018 01/12/2017  Does Patient Have a Medical Advance Directive? Yes Yes Yes  Type of Paramedic of [place];Living will [place];Living will [place];Living will  Copy of [place] in Chart? No - copy requested No - copy requested No - copy requested    Tobacco Social History   Tobacco Use  Smoking Status Never Smoker  Smokeless Tobacco Never Used  Tobacco Comment   smoked 1 year after high school, never since     Counseling given: Not Answered Comment: smoked 1 year after high school, never since   Clinical Intake:  Pre-visit preparation completed: Yes  Pain : 0-10 Pain Score: 9  Pain Type: Chronic pain Pain Location: Leg Pain Orientation: Right Pain Descriptors / Indicators: Aching Pain Onset: More than a month ago Pain Frequency: Intermittent     Nutritional Risks: None Diabetes: No  How often do you need to have someone help you when you read instructions, pamphlets, or other written materials from your doctor or pharmacy?: 1 -  Never What is the last grade level you completed in school?: 12th  Interpreter Needed?: No  Information entered by :: CJohnson, LPN  Past Medical History:  Diagnosis Date   Back pain    Chronic constipation    Hyperlipidemia    Hypertension    Osteopenia    Past Surgical History:  Procedure Laterality Date   CLEFT PALATE REPAIR     FOOT NEUROMA SURGERY  2/06   NEPHRECTOMY LIVING DONOR     Left   TUBAL LIGATION     Family History  Problem Relation Age of Onset   Colon cancer Mother    Diabetes Mother    Coronary artery disease Mother    Osteoporosis Mother    Dementia Mother    Coronary artery disease Father    Heart failure Father    Hypertension Father    Stomach cancer Neg Hx    Social History   Socioeconomic History   Marital status: Married    Spouse name: Not on file   Number of children: Not on file   Years of education: Not on file   Highest education level: Not on file  Occupational History   Occupation: works at a church  Tobacco Use   Smoking status: Never Smoker   Smokeless tobacco: Never Used   Tobacco comment: smoked 1 year after high school, never since  Substance and Sexual Activity   Alcohol use: No    Alcohol/week: 0.0 standard drinks   Drug use: No   Sexual activity: Not on file  Other  Topics Concern   Not on file  Social History Narrative   Not on file   Social Determinants of Health   Financial Resource Strain: Low Risk    Difficulty of Paying Living Expenses: Not hard at all  Food Insecurity: No Food Insecurity   Worried About Charity fundraiser in the Last Year: Never true   [place] in the Last Year: Never true  Transportation Needs: No Transportation Needs   Lack of Transportation (Medical): No   Lack of Transportation (Non-Medical): No  Physical Activity: Inactive   Days of Exercise per Week: 0 days   Minutes of Exercise per Session: 0 min  Stress: No Stress Concern Present   Feeling of Stress : Not at all   Social Connections:    Frequency of Communication with Friends and Family:    Frequency of Social Gatherings with Friends and Family:    Attends Religious Services:    Active Member of Clubs or Organizations:    Attends Archivist Meetings:    Marital Status:     Outpatient Encounter Medications as of 02/17/2020  Medication Sig   albuterol (PROVENTIL HFA;VENTOLIN HFA) 108 (90 Base) MCG/ACT inhaler Inhale 2 puffs into the lungs every 4 (four) hours as needed for wheezing or shortness of breath (cough, shortness of breath or wheezing.).   aspirin 81 MG tablet Take 81 mg by mouth daily.   atorvastatin (LIPITOR) 10 MG tablet Take 1 tablet (10 mg total) by mouth daily.   bisacodyl (DULCOLAX) 5 MG EC tablet Take 5 mg by mouth daily as needed.     Cholecalciferol (VITAMIN D3 PO) Take 1 capsule by mouth daily.   Glucosamine HCl (GLUCOSAMINE PO) Take 1 tablet by mouth daily.   lisinopril (ZESTRIL) 5 MG tablet TAKE 1 TABLET BY MOUTH EVERY DAY   methocarbamol (ROBAXIN) 500 MG tablet Take 1 tablet (500 mg total) by mouth every 8 (eight) hours as needed for muscle spasms (back pain). Caution of sedation   Multiple Vitamins-Minerals (CENTRUM SILVER) tablet Take 1 tablet by mouth daily.     No facility-administered encounter medications on file as of 02/17/2020.    Activities of Daily Living In your present state of health, do you have any difficulty performing the following activities: 02/17/2020  Hearing? Y  Comment some hearing loss noted  Vision? N  Difficulty concentrating or making decisions? Y  Comment some memory loss issues  Walking or climbing stairs? N  Dressing or bathing? N  Doing errands, shopping? N  Preparing Food and eating ? N  Using the Toilet? N  In the past six months, have you accidently leaked urine? Y  Comment wears a pad daily  Do you have problems with loss of bowel control? N  Managing your Medications? N  Managing your Finances? N  Housekeeping or  managing your Housekeeping? N  Some recent data might be hidden    Patient Care Team: Tower, Wynelle Fanny, MD as PCP - Aris Georgia, MD as Consulting Physician (Ophthalmology)    Assessment:   This is a routine wellness examination for [place].  Exercise Activities and Dietary recommendations Current Exercise Habits: The patient does not participate in regular exercise at present, Exercise limited by: None identified  Goals      Increase physical activity     Starting 01/12/2017, I will attempt to walk at least 1 mile daily.      Increase physical activity     Starting Summer 2019  after retirement, I plan to exercise for 60 minutes 3 days per week.      Patient Stated     02/17/2020, I will maintain and continue medications as prescribed.         Fall Risk Fall Risk  02/17/2020 01/29/2018 01/12/2017 06/11/2015 11/14/2013  Falls in the past year? 1 No No No No  Comment fell and broke leg - - - -  Number falls in past yr: 0 - - - -  Injury with Fall? 1 - - - -  Risk for fall due to : Medication side effect - - - -  Follow up Falls evaluation completed;Falls prevention discussed - - - -   Is the patient's home free of loose throw rugs in walkways, pet beds, electrical cords, etc?   yes      Grab bars in the bathroom? yes      Handrails on the stairs?   yes      Adequate lighting?   yes  Timed Get Up and Go performed: N/A  Depression Screen PHQ 2/9 Scores 02/17/2020 02/10/2019 01/29/2018 01/12/2017  PHQ - 2 Score 0 0 0 0  PHQ- 9 Score 0 - 0 -     Cognitive Function MMSE - Mini Mental State Exam 02/17/2020 01/29/2018 01/12/2017  Orientation to time 5 5 5   Orientation to Place 5 5 5   Registration 3 3 3   Attention/ Calculation 5 0 0  Recall 3 3 2   Recall-comments - - pt was unable to recall 1 of 3 words  Language- name 2 objects - 0 0  Language- repeat 1 1 1   Language- follow 3 step command - 3 3  Language- read & follow direction - 0 0  Write a sentence - 0 0  Copy design  - 0 0  Total score - 20 19  Mini Cog  Mini-Cog screen was completed. Maximum score is 22. A value of 0 denotes this part of the MMSE was not completed or the patient failed this part of the Mini-Cog screening.       Immunization History  Administered Date(s) Administered   Influenza Split 09/20/2012   Influenza, High Dose Seasonal PF 06/26/2018, 07/09/2019   Influenza,inj,Quad PF,6+ Mos 06/11/2015   Influenza-Unspecified 07/09/2016   PFIZER SARS-COV-2 Vaccination 10/31/2019, 11/22/2019   Pneumococcal Conjugate-13 06/11/2015   Pneumococcal Polysaccharide-23 11/14/2013   Td 03/31/2002   Tdap 01/27/2011   Zoster 11/11/2011   Zoster Recombinat (Shingrix) 07/23/2019    Qualifies for Shingles Vaccine: administered 07/23/2019  Screening Tests Health Maintenance  Topic Date Due   INFLUENZA VACCINE  05/09/2020   MAMMOGRAM  12/15/2020   TETANUS/TDAP  01/26/2021   DEXA SCAN  Completed   COVID-19 Vaccine  Completed   PNA vac Low Risk Adult  Completed    Cancer Screenings: Lung: Low Dose CT Chest recommended if Age 37-80 years, 30 pack-year currently smoking OR have quit w/in 15 years. Patient does not qualify. Breast:  Up to date on Mammogram: Yes, completed 12/16/2019   Bone Density/Dexa: completed 02/26/2017 Colorectal: completed 01/02/2014  Additional Screenings:  Hepatitis C Screening: N/A      Plan:    Patient will maintain and continue medications as prescribed.   I have personally reviewed and noted the following in the patient's chart:   Medical and social history Use of alcohol, tobacco or illicit drugs  Current medications and supplements Functional ability and status Nutritional status Physical activity Advanced directives List of other physicians Hospitalizations, surgeries, and  ER visits in previous 12 months Vitals Screenings to include cognitive, depression, and falls Referrals and appointments  In addition, I have reviewed and discussed with patient  certain preventive protocols, quality metrics, and best practice recommendations. A written personalized care plan for preventive services as well as general preventive health recommendations were provided to patient.     Andrez Grime, LPN  [id number]  I reviewed health advisor's note, was available for consultation, and agree with documentation and plan. Loura Pardon MD

## 2020-02-20 ENCOUNTER — Ambulatory Visit (INDEPENDENT_AMBULATORY_CARE_PROVIDER_SITE_OTHER): Payer: Medicare HMO | Admitting: Family Medicine

## 2020-02-20 ENCOUNTER — Encounter: Payer: Self-pay | Admitting: Family Medicine

## 2020-02-20 ENCOUNTER — Other Ambulatory Visit: Payer: Self-pay

## 2020-02-20 VITALS — BP 124/72 | HR 80 | Temp 97.1°F | Ht 62.5 in | Wt 139.6 lb

## 2020-02-20 DIAGNOSIS — R7303 Prediabetes: Secondary | ICD-10-CM | POA: Diagnosis not present

## 2020-02-20 DIAGNOSIS — I10 Essential (primary) hypertension: Secondary | ICD-10-CM

## 2020-02-20 DIAGNOSIS — Z800 Family history of malignant neoplasm of digestive organs: Secondary | ICD-10-CM | POA: Diagnosis not present

## 2020-02-20 DIAGNOSIS — E7800 Pure hypercholesterolemia, unspecified: Secondary | ICD-10-CM | POA: Diagnosis not present

## 2020-02-20 DIAGNOSIS — E2839 Other primary ovarian failure: Secondary | ICD-10-CM | POA: Diagnosis not present

## 2020-02-20 DIAGNOSIS — Z0000 Encounter for general adult medical examination without abnormal findings: Secondary | ICD-10-CM

## 2020-02-20 DIAGNOSIS — M8589 Other specified disorders of bone density and structure, multiple sites: Secondary | ICD-10-CM | POA: Diagnosis not present

## 2020-02-20 MED ORDER — LISINOPRIL 5 MG PO TABS: 5.0000 mg | ORAL_TABLET | Freq: Every day | ORAL | 3 refills | Status: DC

## 2020-02-20 MED ORDER — ATORVASTATIN CALCIUM 10 MG PO TABS: 10.0000 mg | ORAL_TABLET | Freq: Every day | ORAL | 3 refills | Status: DC

## 2020-02-20 NOTE — Patient Instructions (Addendum)
Let's get a bone density test  I think we need to consider medication to prevent fractures (like alendronate)  Call and schedule that   Take care of yourself   Labs look good

## 2020-02-20 NOTE — Progress Notes (Signed)
Subjective:    Patient ID: [name], female    DOB: [birth year], 79 y.o.   MRN: [id number]  This visit occurred during the SARS-CoV-2 public health emergency.  Safety protocols were in place, including screening questions prior to the visit, additional usage of staff PPE, and extensive cleaning of exam room while observing appropriate contact time as indicated for disinfecting solutions.    HPI Here for health maintenance exam and to review chronic medical problems    Had amw on 5/11-no gaps or concerns   Has had covid vaccines Also shingrix -due for 2nd one   Wt Readings from Last 3 Encounters:  02/20/20 139 lb 9.6 oz (63.3 kg)  02/17/20 135 lb (61.2 kg)  01/02/20 139 lb 6 oz (63.2 kg)  fairly stable weight  25.13 kg/m   Mammogram was 3/21 Self breast exam -no lumps or changes (had a bruise this year)    dexa 5/18 -stable osteopenia at spine  slt dec bmd at [facility] [place]-tripped on tree stump  Fractures= she broke her R fibula/ankle  in march- working in the yard Supplements-take ca and D  Exercise - goes to exercise classes and walks   bp is stable today  No cp or palpitations or headaches or edema  No side effects to medicines  BP Readings from Last 3 Encounters:  02/20/20 124/72  01/02/20 120/70  02/10/19 136/77     Pulse Readings from Last 3 Encounters:  02/20/20 80  01/02/20 99  02/10/19 92    Colon screening-colonoscopy 5/15  No recall due to age  Has a family hx cologuard 2019 -normal   Hyperlipidemia  Lab Results  Component Value Date   CHOL 172 02/17/2020   CHOL 159 02/12/2019   CHOL 147 01/29/2018   Lab Results  Component Value Date   HDL 41.90 02/17/2020   HDL 49.80 02/12/2019   HDL 43.70 01/29/2018   Lab Results  Component Value Date   LDLCALC 97 02/17/2020   LDLCALC 87 02/12/2019   LDLCALC 85 01/29/2018   Lab Results  Component Value Date   TRIG 170.0 (H) 02/17/2020   TRIG 111.0 02/12/2019   TRIG 94.0 01/29/2018   Lab  Results  Component Value Date   CHOLHDL 4 02/17/2020   CHOLHDL 3 02/12/2019   CHOLHDL 3 01/29/2018   No results found for: LDLDIRECT Atorvastatin and diet  Has had disruption in routine due to leg fx Does not eat a lot of fatty foods   Prediabetes Lab Results  Component Value Date   HGBA1C 6.0 02/17/2020   Unchanged  Lab Results  Component Value Date   CREATININE 1.00 02/17/2020   BUN 18 02/17/2020   NA 140 02/17/2020   K 5.1 02/17/2020   CL 106 02/17/2020   CO2 29 02/17/2020   Lab Results  Component Value Date   ALT 14 02/17/2020   AST 18 02/17/2020   ALKPHOS 85 02/17/2020   BILITOT 0.3 02/17/2020    Lab Results  Component Value Date   WBC 6.6 02/17/2020   HGB 12.8 02/17/2020   HCT 37.5 02/17/2020   MCV 92.6 02/17/2020   PLT 217.0 02/17/2020    Lab Results  Component Value Date   TSH 2.67 02/17/2020    Patient Active Problem List   Diagnosis Date Noted  . Hematoma of left breast 01/02/2020  . Hyperkalemia 02/01/2018  . Screening mammogram, encounter for 01/19/2017  . Estrogen deficiency 01/19/2017  . Routine general medical examination at a health  care facility 06/11/2015  . Encounter for Medicare annual wellness exam 11/14/2013  . Dermatitis 11/14/2013  . Family history of colon cancer 11/14/2013  . Colon cancer screening 11/14/2013  . Hypertension 01/27/2011  . Osteopenia 01/27/2011  . Post-menopausal 01/27/2011  . Low back pain radiating to left leg 03/13/2008  . Hyperlipidemia 12/20/2007  . CONSTIPATION, CHRONIC 12/20/2007  . Disorder of bone and cartilage 12/20/2007  . Prediabetes 12/20/2007   Past Medical History:  Diagnosis Date  . Back pain   . Chronic constipation   . Hyperlipidemia   . Hypertension   . Osteopenia    Past Surgical History:  Procedure Laterality Date  . CLEFT PALATE REPAIR    . FOOT NEUROMA SURGERY  2/06  . NEPHRECTOMY LIVING DONOR     Left  . TUBAL LIGATION     Social History   Tobacco Use  . Smoking  status: Never Smoker  . Smokeless tobacco: Never Used  . Tobacco comment: smoked 1 year after high school, never since  Substance Use Topics  . Alcohol use: No    Alcohol/week: 0.0 standard drinks  . Drug use: No   Family History  Problem Relation Age of Onset  . Colon cancer Mother   . Diabetes Mother   . Coronary artery disease Mother   . Osteoporosis Mother   . Dementia Mother   . Coronary artery disease Father   . Heart failure Father   . Hypertension Father   . Stomach cancer Neg Hx    Allergies  Allergen Reactions  . Codeine   . Hydrocodone-Homatropine     Caused flu-like symptoms   Current Outpatient Medications on File Prior to Visit  Medication Sig Dispense Refill  . albuterol (PROVENTIL HFA;VENTOLIN HFA) 108 (90 Base) MCG/ACT inhaler Inhale 2 puffs into the lungs every 4 (four) hours as needed for wheezing or shortness of breath (cough, shortness of breath or wheezing.). 1 Inhaler 1  . aspirin 81 MG tablet Take 81 mg by mouth daily.    . bisacodyl (DULCOLAX) 5 MG EC tablet Take 5 mg by mouth daily as needed.      . Cholecalciferol (VITAMIN D3 PO) Take 1 capsule by mouth daily.    . Glucosamine HCl (GLUCOSAMINE PO) Take 1 tablet by mouth daily.    . methocarbamol (ROBAXIN) 500 MG tablet Take 1 tablet (500 mg total) by mouth every 8 (eight) hours as needed for muscle spasms (back pain). Caution of sedation 30 tablet 0  . Multiple Vitamins-Minerals (CENTRUM SILVER) tablet Take 1 tablet by mouth daily.       No current facility-administered medications on file prior to visit.    Review of Systems  Constitutional: Negative for activity change, appetite change, fatigue, fever and unexpected weight change.  HENT: Negative for congestion, ear pain, rhinorrhea, sinus pressure and sore throat.   Eyes: Negative for pain, redness and visual disturbance.  Respiratory: Negative for cough, shortness of breath and wheezing.   Cardiovascular: Negative for chest pain and  palpitations.  Gastrointestinal: Negative for abdominal pain, blood in stool, constipation and diarrhea.  Endocrine: Negative for polydipsia and polyuria.  Genitourinary: Negative for dysuria, frequency and urgency.  Musculoskeletal: Negative for arthralgias, back pain and myalgias.       Healing leg fx-in a boot   Skin: Negative for pallor and rash.  Allergic/Immunologic: Negative for environmental allergies.  Neurological: Negative for dizziness, syncope and headaches.  Hematological: Negative for adenopathy. Does not bruise/bleed easily.  Psychiatric/Behavioral: Negative for decreased  concentration and dysphoric mood. The patient is not nervous/anxious.        Objective:   Physical Exam Constitutional:      General: She is not in acute distress.    Appearance: Normal appearance. She is well-developed and normal weight. She is not ill-appearing or diaphoretic.  HENT:     Head: Normocephalic and atraumatic.     Right Ear: Tympanic membrane, ear canal and external ear normal.     Left Ear: Tympanic membrane, ear canal and external ear normal.     Nose: Nose normal. No congestion.     Mouth/Throat:     Mouth: Mucous membranes are moist.     Pharynx: Oropharynx is clear. No posterior oropharyngeal erythema.  Eyes:     General: No scleral icterus.    Extraocular Movements: Extraocular movements intact.     Conjunctiva/sclera: Conjunctivae normal.     Pupils: Pupils are equal, round, and reactive to light.  Neck:     Thyroid: No thyromegaly.     Vascular: No carotid bruit or JVD.  Cardiovascular:     Rate and Rhythm: Normal rate and regular rhythm.     Pulses: Normal pulses.     Heart sounds: Normal heart sounds. No gallop.   Pulmonary:     Effort: Pulmonary effort is normal. No respiratory distress.     Breath sounds: Normal breath sounds. No wheezing.     Comments: Good air exch Chest:     Chest wall: No tenderness.  Abdominal:     General: Bowel sounds are normal. There  is no distension or abdominal bruit.     Palpations: Abdomen is soft. There is no mass.     Tenderness: There is no abdominal tenderness.     Hernia: No hernia is present.  Genitourinary:    Comments: Breast exam: No mass, nodules, thickening, tenderness, bulging, retraction, inflamation, nipple discharge or skin changes noted.  No axillary or clavicular LA.     Musculoskeletal:        General: No tenderness. Normal range of motion.     Cervical back: Normal range of motion and neck supple. No rigidity. No muscular tenderness.     Right lower leg: No edema.     Left lower leg: No edema.     Comments: No kyphosis   No swelling  R leg is healing/ has a boot  Lymphadenopathy:     Cervical: No cervical adenopathy.  Skin:    General: Skin is warm and dry.     Coloration: Skin is not pale.     Findings: No erythema or rash.     Comments: Solar lentigines diffusely   Neurological:     Mental Status: She is alert. Mental status is at baseline.     Cranial Nerves: No cranial nerve deficit.     Motor: No abnormal muscle tone.     Coordination: Coordination normal.     Gait: Gait normal.     Deep Tendon Reflexes: Reflexes are normal and symmetric. Reflexes normal.  Psychiatric:        Mood and Affect: Mood normal.        Cognition and Memory: Cognition and memory normal.           Assessment & Plan:   Problem List Items Addressed This Visit      Cardiovascular and Mediastinum   Hypertension    bp in fair control at this time  BP Readings from Last 1 Encounters:  02/20/20 124/72  No changes needed Most recent labs reviewed  Disc lifstyle change with low sodium diet and exercise        Relevant Medications   atorvastatin (LIPITOR) 10 MG tablet   lisinopril (ZESTRIL) 5 MG tablet     Musculoskeletal and Integument   Osteopenia    dexa is due- will schedule that  Recent fracture of lower leg  Disc fall avoidance in detail Taking ca and D Good exercise  Will likely  discuss starting alendronate after dexa returns        Other   Hyperlipidemia    Disc goals for lipids and reasons to control them Rev last labs with pt Rev low sat fat diet in detail  Plans on getting back to exercise now that leg is healing      Relevant Medications   atorvastatin (LIPITOR) 10 MG tablet   lisinopril (ZESTRIL) 5 MG tablet   Prediabetes    Lab Results  Component Value Date   HGBA1C 6.0 02/17/2020   disc imp of low glycemic diet and wt loss to prevent DM2  stable      Family history of colon cancer    cologuard neg 2019  That is good for 3 y      Routine general medical examination at a health care facility - Primary    Reviewed health habits including diet and exercise and skin cancer prevention Reviewed appropriate screening tests for age  Also reviewed health mt list, fam hx and immunization status , as well as social and family history   Rev amw  Had covid vaccines Due for one more shigrix  Wt is stable Planned dexa/will likely need OP medication  cologuard 2019 UTD Plans to return to exercise when leg is healed Disc fall precautions in detail         Estrogen deficiency   Relevant Orders   DG Bone Density

## 2020-02-21 NOTE — Assessment & Plan Note (Signed)
bp in fair control at this time  BP Readings from Last 1 Encounters:  02/20/20 124/72   No changes needed Most recent labs reviewed  Disc lifstyle change with low sodium diet and exercise

## 2020-02-21 NOTE — Assessment & Plan Note (Signed)
Disc goals for lipids and reasons to control them Rev last labs with pt Rev low sat fat diet in detail  Plans on getting back to exercise now that leg is healing

## 2020-02-21 NOTE — Assessment & Plan Note (Signed)
cologuard neg 2019  That is good for 3 y

## 2020-02-21 NOTE — Assessment & Plan Note (Signed)
Reviewed health habits including diet and exercise and skin cancer prevention Reviewed appropriate screening tests for age  Also reviewed health mt list, fam hx and immunization status , as well as social and family history   Rev amw  Had covid vaccines Due for one more shigrix  Wt is stable Planned dexa/will likely need OP medication  cologuard 2019 UTD Plans to return to exercise when leg is healed Disc fall precautions in detail

## 2020-02-21 NOTE — Assessment & Plan Note (Signed)
dexa is due- will schedule that  Recent fracture of lower leg  Disc fall avoidance in detail Taking ca and D Good exercise  Will likely discuss starting alendronate after dexa returns

## 2020-02-21 NOTE — Assessment & Plan Note (Signed)
Lab Results  Component Value Date   HGBA1C 6.0 02/17/2020   disc imp of low glycemic diet and wt loss to prevent DM2  stable

## 2020-03-03 DIAGNOSIS — M25571 Pain in right ankle and joints of right foot: Secondary | ICD-10-CM | POA: Diagnosis not present

## 2020-04-08 DIAGNOSIS — S82424K Nondisplaced transverse fracture of shaft of right fibula, subsequent encounter for closed fracture with nonunion: Secondary | ICD-10-CM | POA: Diagnosis not present

## 2020-04-08 DIAGNOSIS — M79671 Pain in right foot: Secondary | ICD-10-CM | POA: Insufficient documentation

## 2020-04-09 DIAGNOSIS — S82424K Nondisplaced transverse fracture of shaft of right fibula, subsequent encounter for closed fracture with nonunion: Secondary | ICD-10-CM | POA: Diagnosis not present

## 2020-04-20 ENCOUNTER — Other Ambulatory Visit: Payer: Self-pay

## 2020-04-20 ENCOUNTER — Ambulatory Visit
Admission: RE | Admit: 2020-04-20 | Discharge: 2020-04-20 | Disposition: A | Discharge status: Home / Self Care | Payer: Medicare HMO | Source: Ambulatory Visit | Attending: Family Medicine | Admitting: Family Medicine

## 2020-04-20 DIAGNOSIS — Z780 Asymptomatic menopausal state: Secondary | ICD-10-CM | POA: Diagnosis not present

## 2020-04-20 DIAGNOSIS — E2839 Other primary ovarian failure: Secondary | ICD-10-CM

## 2020-04-20 DIAGNOSIS — M810 Age-related osteoporosis without current pathological fracture: Secondary | ICD-10-CM | POA: Diagnosis not present

## 2020-04-20 DIAGNOSIS — M85852 Other specified disorders of bone density and structure, left thigh: Secondary | ICD-10-CM | POA: Diagnosis not present

## 2020-04-21 ENCOUNTER — Telehealth: Payer: Self-pay | Admitting: *Deleted

## 2020-04-21 NOTE — Telephone Encounter (Addendum)
Left VM requesting pt to call the office back regarding her DEXA results. See Dr. Marliss Coots comments below

## 2020-04-21 NOTE — Telephone Encounter (Signed)
-----   Message from Abner Greenspan, MD sent at 04/20/2020  8:09 PM EDT ----- Significant osteoporosis in forearm  Please f/u to discuss medication for fracture prevention

## 2020-04-26 NOTE — Telephone Encounter (Signed)
Left 2nd VM requesting pt to call the office back  

## 2020-04-27 ENCOUNTER — Encounter: Payer: Self-pay | Admitting: *Deleted

## 2020-04-27 NOTE — Telephone Encounter (Signed)
Letter mailed

## 2020-05-07 ENCOUNTER — Other Ambulatory Visit: Payer: Self-pay

## 2020-05-07 ENCOUNTER — Encounter: Payer: Self-pay | Admitting: Family Medicine

## 2020-05-07 ENCOUNTER — Ambulatory Visit (INDEPENDENT_AMBULATORY_CARE_PROVIDER_SITE_OTHER): Payer: Medicare HMO | Admitting: Family Medicine

## 2020-05-07 VITALS — BP 124/70 | HR 84 | Temp 96.9°F | Ht 62.5 in | Wt 136.5 lb

## 2020-05-07 DIAGNOSIS — M899 Disorder of bone, unspecified: Secondary | ICD-10-CM | POA: Diagnosis not present

## 2020-05-07 DIAGNOSIS — M8000XA Age-related osteoporosis with current pathological fracture, unspecified site, initial encounter for fracture: Secondary | ICD-10-CM

## 2020-05-07 DIAGNOSIS — M949 Disorder of cartilage, unspecified: Secondary | ICD-10-CM

## 2020-05-07 NOTE — Progress Notes (Signed)
Subjective:    Patient ID: [name], female    DOB: 05-11-1941, 79 y.o.   MRN: [id number]  This visit occurred during the SARS-CoV-2 public health emergency.  Safety protocols were in place, including screening questions prior to the visit, additional usage of staff PPE, and extensive cleaning of exam room while observing appropriate contact time as indicated for disinfecting solutions.    HPI Pt presents to discuss bone density test results from earlier this month  Wt Readings from Last 3 Encounters:  05/07/20 136 lb 8 oz (61.9 kg)  02/20/20 139 lb 9.6 oz (63.3 kg)  02/17/20 135 lb (61.2 kg)   24.57 kg/m   Past h/o osteopenia  Her forearm score was in OP range at -3.7   Several fractures  She does take ca and D Exercises as well  Noted she started taking osteo bi flex  Never smoked   Fall on L side and fractured R leg (4 mo ago)- she was working in the yard pulling weeds bent from the waist -her R leg got stuck in roots- she pivoted and fell Sees Dr Delilah Shan 2 areas in fibula  R leg/foot fracture currently  Is currently using a bone stimulator for healing/leg  Finger fracture L hand in the past from a fall   She feels more unsteady in the boot-being careful  Still works in the yard    L shoulder- painful to lift past 90 deg since fall  No swelling   Has lost several inches    Post menopausal  No steroids or high risk medications   No GI problems /no reflux  No significant dental problems   (past dental surgery as a teen after an accident)  Nothing since     Lab Results  Component Value Date   TSH 2.67 02/17/2020   Lab Results  Component Value Date   CALCIUM 9.5 02/17/2020   PHOS 3.6 05/12/2010      Family history Mother had OP and lost height    Report assessment : ASSESSMENT: The BMD measured at Forearm Radius 33% is 0.562 g/cm2 with a T-score of -3.7. This patient is considered osteoporotic according to [place]  [facility]) criteria. The scan quality is good. Lumbar spine was not utilized due to advanced degenerative changes and surgical artifacts.  Site Region Measured Date Measured Age YA BMD Significant CHANGE T-score Right Forearm Radius 33% 04/20/2020 77.6 -3.7 0.562 g/cm2  DualFemur Neck Left 04/20/2020 77.6 -1.3 0.863 g/cm2 DualFemur Neck Left 02/26/2017 74.5 -1.5 0.846 g/cm2 *  DualFemur Total Mean 04/20/2020 77.6 -0.7 0.923 g/cm2 * DualFemur Total Mean 02/26/2017 74.5 -0.5 0.956 g/cm2   Patient Active Problem List   Diagnosis Date Noted  . Hematoma of left breast 01/02/2020  . Hyperkalemia 02/01/2018  . Screening mammogram, encounter for 01/19/2017  . Estrogen deficiency 01/19/2017  . Routine general medical examination at a health care facility 06/11/2015  . Encounter for Medicare annual wellness exam 11/14/2013  . Dermatitis 11/14/2013  . Family history of colon cancer 11/14/2013  . Colon cancer screening 11/14/2013  . Hypertension 01/27/2011  . Osteoporosis 01/27/2011  . Post-menopausal 01/27/2011  . Low back pain radiating to left leg 03/13/2008  . Hyperlipidemia 12/20/2007  . CONSTIPATION, CHRONIC 12/20/2007  . Prediabetes 12/20/2007   Past Medical History:  Diagnosis Date  . Back pain   . Chronic constipation   . Hyperlipidemia   . Hypertension   . Osteopenia    Past Surgical History:  Procedure  Laterality Date  . CLEFT PALATE REPAIR    . FOOT NEUROMA SURGERY  2/06  . NEPHRECTOMY LIVING DONOR     Left  . TUBAL LIGATION     Social History   Tobacco Use  . Smoking status: Never Smoker  . Smokeless tobacco: Never Used  . Tobacco comment: smoked 1 year after high school, never since  Vaping Use  . Vaping Use: Never used  Substance Use Topics  . Alcohol use: No    Alcohol/week: 0.0 standard drinks  . Drug use: No   Family History  Problem Relation Age of Onset  . Colon cancer Mother   . Diabetes Mother   . Coronary artery disease Mother   .  Osteoporosis Mother   . Dementia Mother   . Coronary artery disease Father   . Heart failure Father   . Hypertension Father   . Stomach cancer Neg Hx    Allergies  Allergen Reactions  . Codeine   . Hydrocodone-Homatropine     Caused flu-like symptoms   Current Outpatient Medications on File Prior to Visit  Medication Sig Dispense Refill  . aspirin 81 MG tablet Take 81 mg by mouth daily.    Marland Kitchen atorvastatin (LIPITOR) 10 MG tablet Take 1 tablet (10 mg total) by mouth daily. 90 tablet 3  . bisacodyl (DULCOLAX) 5 MG EC tablet Take 5 mg by mouth daily as needed.      . Boswellia-Glucosamine-Vit D (OSTEO BI-FLEX ONE PER DAY PO) Take 1 tablet by mouth daily.    . Cholecalciferol (VITAMIN D3 PO) Take 1 capsule by mouth daily.    Marland Kitchen lisinopril (ZESTRIL) 5 MG tablet Take 1 tablet (5 mg total) by mouth daily. 90 tablet 3  . Multiple Vitamins-Minerals (CENTRUM SILVER) tablet Take 1 tablet by mouth daily.       No current facility-administered medications on file prior to visit.     Review of Systems  Constitutional: Negative for activity change, appetite change, fatigue, fever and unexpected weight change.  HENT: Negative for congestion, ear pain, rhinorrhea, sinus pressure and sore throat.   Eyes: Negative for pain, redness and visual disturbance.  Respiratory: Negative for cough, shortness of breath and wheezing.   Cardiovascular: Negative for chest pain and palpitations.  Gastrointestinal: Negative for abdominal pain, blood in stool, constipation and diarrhea.  Endocrine: Negative for polydipsia and polyuria.  Genitourinary: Negative for dysuria, frequency and urgency.  Musculoskeletal: Negative for arthralgias, back pain and myalgias.       Healing fib fracture   Skin: Negative for pallor and rash.  Allergic/Immunologic: Negative for environmental allergies.  Neurological: Negative for dizziness, syncope and headaches.  Hematological: Negative for adenopathy. Does not bruise/bleed  easily.  Psychiatric/Behavioral: Negative for decreased concentration and dysphoric mood. The patient is not nervous/anxious.        Objective:   Physical Exam Constitutional:      General: She is not in acute distress.    Appearance: Normal appearance. She is normal weight. She is not ill-appearing.  Eyes:     General: No scleral icterus.    Conjunctiva/sclera: Conjunctivae normal.     Pupils: Pupils are equal, round, and reactive to light.  Musculoskeletal:     Comments: No kyphosis   Wearing a boot for fibular fracture  Skin:    General: Skin is warm and dry.     Coloration: Skin is not pale.     Findings: No erythema.  Neurological:     Mental Status: She  is alert.     Sensory: No sensory deficit.     Coordination: Coordination normal.  Psychiatric:        Mood and Affect: Mood normal.           Assessment & Plan:   Problem List Items Addressed This Visit      Musculoskeletal and Integument   Osteoporosis    With family hx and past h/o fractures Rev recent dexa  Would like her to start alendronate (disc poss side eff in detail) as soon as deemed safe by her ortho (current fib fx) Disc fall prevention in detail -esp when on uneven surfaces Handouts given on OP and alendronate She will call when ready to start it  Plan on 5 y course if tolerated well      RESOLVED: Disorder of bone and cartilage - Primary

## 2020-05-07 NOTE — Patient Instructions (Addendum)
Please be careful not to fall  Use a stool/garden kneeler  Wear supportive shoes - and stay on even surfaces as much as possible  Don't walk with arms full  Keep eyes on ground in front of you   I would like to start you on alendronate (weekly) for 5 years Talk to your orthopedist re: when it is safe to start it -then call and let us know

## 2020-05-09 NOTE — Assessment & Plan Note (Signed)
With family hx and past h/o fractures Rev recent dexa  Would like her to start alendronate (disc poss side eff in detail) as soon as deemed safe by her ortho (current fib fx) Disc fall prevention in detail -esp when on uneven surfaces Handouts given on OP and alendronate She will call when ready to start it  Plan on 5 y course if tolerated well

## 2020-05-14 DIAGNOSIS — R69 Illness, unspecified: Secondary | ICD-10-CM | POA: Diagnosis not present

## 2020-05-14 DIAGNOSIS — Z809 Family history of malignant neoplasm, unspecified: Secondary | ICD-10-CM | POA: Diagnosis not present

## 2020-05-14 DIAGNOSIS — Z8249 Family history of ischemic heart disease and other diseases of the circulatory system: Secondary | ICD-10-CM | POA: Diagnosis not present

## 2020-05-14 DIAGNOSIS — Z7982 Long term (current) use of aspirin: Secondary | ICD-10-CM | POA: Diagnosis not present

## 2020-05-14 DIAGNOSIS — M1990 Unspecified osteoarthritis, unspecified site: Secondary | ICD-10-CM | POA: Diagnosis not present

## 2020-05-14 DIAGNOSIS — R32 Unspecified urinary incontinence: Secondary | ICD-10-CM | POA: Diagnosis not present

## 2020-05-14 DIAGNOSIS — I10 Essential (primary) hypertension: Secondary | ICD-10-CM | POA: Diagnosis not present

## 2020-05-14 DIAGNOSIS — Z833 Family history of diabetes mellitus: Secondary | ICD-10-CM | POA: Diagnosis not present

## 2020-05-14 DIAGNOSIS — Z008 Encounter for other general examination: Secondary | ICD-10-CM | POA: Diagnosis not present

## 2020-05-14 DIAGNOSIS — E785 Hyperlipidemia, unspecified: Secondary | ICD-10-CM | POA: Diagnosis not present

## 2020-05-14 DIAGNOSIS — Z823 Family history of stroke: Secondary | ICD-10-CM | POA: Diagnosis not present

## 2020-05-20 ENCOUNTER — Other Ambulatory Visit: Payer: Self-pay | Admitting: Sports Medicine

## 2020-05-20 DIAGNOSIS — M25512 Pain in left shoulder: Secondary | ICD-10-CM | POA: Insufficient documentation

## 2020-05-20 DIAGNOSIS — M25571 Pain in right ankle and joints of right foot: Secondary | ICD-10-CM

## 2020-05-20 DIAGNOSIS — S82424K Nondisplaced transverse fracture of shaft of right fibula, subsequent encounter for closed fracture with nonunion: Secondary | ICD-10-CM | POA: Diagnosis not present

## 2020-05-31 ENCOUNTER — Ambulatory Visit
Admission: RE | Admit: 2020-05-31 | Discharge: 2020-05-31 | Disposition: A | Discharge status: Home / Self Care | Payer: Medicare HMO | Source: Ambulatory Visit | Attending: Sports Medicine | Admitting: Sports Medicine

## 2020-05-31 DIAGNOSIS — M25571 Pain in right ankle and joints of right foot: Secondary | ICD-10-CM

## 2020-05-31 DIAGNOSIS — S82401A Unspecified fracture of shaft of right fibula, initial encounter for closed fracture: Secondary | ICD-10-CM | POA: Diagnosis not present

## 2020-05-31 DIAGNOSIS — M25461 Effusion, right knee: Secondary | ICD-10-CM | POA: Insufficient documentation

## 2020-05-31 DIAGNOSIS — M19071 Primary osteoarthritis, right ankle and foot: Secondary | ICD-10-CM | POA: Diagnosis not present

## 2020-05-31 DIAGNOSIS — M25561 Pain in right knee: Secondary | ICD-10-CM | POA: Insufficient documentation

## 2020-05-31 DIAGNOSIS — S82831K Other fracture of upper and lower end of right fibula, subsequent encounter for closed fracture with nonunion: Secondary | ICD-10-CM | POA: Diagnosis not present

## 2020-06-04 DIAGNOSIS — S82424K Nondisplaced transverse fracture of shaft of right fibula, subsequent encounter for closed fracture with nonunion: Secondary | ICD-10-CM | POA: Diagnosis not present

## 2020-07-26 DIAGNOSIS — S82424K Nondisplaced transverse fracture of shaft of right fibula, subsequent encounter for closed fracture with nonunion: Secondary | ICD-10-CM | POA: Diagnosis not present

## 2020-08-02 DIAGNOSIS — H25813 Combined forms of age-related cataract, bilateral: Secondary | ICD-10-CM | POA: Diagnosis not present

## 2020-08-02 DIAGNOSIS — H35371 Puckering of macula, right eye: Secondary | ICD-10-CM | POA: Diagnosis not present

## 2020-08-02 DIAGNOSIS — H43813 Vitreous degeneration, bilateral: Secondary | ICD-10-CM | POA: Diagnosis not present

## 2020-08-02 DIAGNOSIS — H5213 Myopia, bilateral: Secondary | ICD-10-CM | POA: Diagnosis not present

## 2020-09-06 DIAGNOSIS — M25512 Pain in left shoulder: Secondary | ICD-10-CM | POA: Diagnosis not present

## 2020-09-06 DIAGNOSIS — M25571 Pain in right ankle and joints of right foot: Secondary | ICD-10-CM | POA: Diagnosis not present

## 2020-09-06 DIAGNOSIS — S82424K Nondisplaced transverse fracture of shaft of right fibula, subsequent encounter for closed fracture with nonunion: Secondary | ICD-10-CM | POA: Diagnosis not present

## 2020-10-13 DIAGNOSIS — H43813 Vitreous degeneration, bilateral: Secondary | ICD-10-CM | POA: Diagnosis not present

## 2020-10-13 DIAGNOSIS — H35371 Puckering of macula, right eye: Secondary | ICD-10-CM | POA: Diagnosis not present

## 2020-10-13 DIAGNOSIS — H25813 Combined forms of age-related cataract, bilateral: Secondary | ICD-10-CM | POA: Diagnosis not present

## 2020-10-13 DIAGNOSIS — H5310 Unspecified subjective visual disturbances: Secondary | ICD-10-CM | POA: Diagnosis not present

## 2020-10-27 DIAGNOSIS — L570 Actinic keratosis: Secondary | ICD-10-CM | POA: Diagnosis not present

## 2020-10-27 DIAGNOSIS — L700 Acne vulgaris: Secondary | ICD-10-CM | POA: Diagnosis not present

## 2020-10-27 DIAGNOSIS — L821 Other seborrheic keratosis: Secondary | ICD-10-CM | POA: Diagnosis not present

## 2020-10-27 DIAGNOSIS — L918 Other hypertrophic disorders of the skin: Secondary | ICD-10-CM | POA: Diagnosis not present

## 2020-10-27 DIAGNOSIS — L814 Other melanin hyperpigmentation: Secondary | ICD-10-CM | POA: Diagnosis not present

## 2020-10-27 DIAGNOSIS — L578 Other skin changes due to chronic exposure to nonionizing radiation: Secondary | ICD-10-CM | POA: Diagnosis not present

## 2020-12-02 ENCOUNTER — Encounter (HOSPITAL_COMMUNITY): Payer: Self-pay

## 2020-12-02 ENCOUNTER — Emergency Department (HOSPITAL_COMMUNITY): Payer: Medicare HMO

## 2020-12-02 ENCOUNTER — Telehealth: Payer: Self-pay

## 2020-12-02 ENCOUNTER — Other Ambulatory Visit: Payer: Self-pay

## 2020-12-02 ENCOUNTER — Emergency Department (HOSPITAL_COMMUNITY)
Admission: EM | Admit: 2020-12-02 | Discharge: 2020-12-02 | Disposition: A | Discharge status: Home / Self Care | Payer: Medicare HMO | Attending: Emergency Medicine | Admitting: Emergency Medicine

## 2020-12-02 DIAGNOSIS — U071 COVID-19: Secondary | ICD-10-CM

## 2020-12-02 DIAGNOSIS — R1013 Epigastric pain: Secondary | ICD-10-CM | POA: Insufficient documentation

## 2020-12-02 DIAGNOSIS — K859 Acute pancreatitis without necrosis or infection, unspecified: Secondary | ICD-10-CM | POA: Insufficient documentation

## 2020-12-02 DIAGNOSIS — R109 Unspecified abdominal pain: Secondary | ICD-10-CM | POA: Diagnosis not present

## 2020-12-02 DIAGNOSIS — Z79899 Other long term (current) drug therapy: Secondary | ICD-10-CM | POA: Insufficient documentation

## 2020-12-02 DIAGNOSIS — I10 Essential (primary) hypertension: Secondary | ICD-10-CM | POA: Diagnosis not present

## 2020-12-02 DIAGNOSIS — Z7982 Long term (current) use of aspirin: Secondary | ICD-10-CM | POA: Diagnosis not present

## 2020-12-02 LAB — URINALYSIS, ROUTINE W REFLEX MICROSCOPIC
Bacteria, UA: NONE SEEN
Bilirubin Urine: NEGATIVE
Glucose, UA: NEGATIVE mg/dL
Ketones, ur: NEGATIVE mg/dL
Leukocytes,Ua: NEGATIVE
Nitrite: NEGATIVE
Protein, ur: NEGATIVE mg/dL
Specific Gravity, Urine: 1.009 (ref 1.005–1.030)
pH: 6 (ref 5.0–8.0)

## 2020-12-02 LAB — COMPREHENSIVE METABOLIC PANEL
ALT: 13 U/L (ref 0–44)
AST: 23 U/L (ref 15–41)
Albumin: 4.3 g/dL (ref 3.5–5.0)
Alkaline Phosphatase: 95 U/L (ref 38–126)
Anion gap: 10 (ref 5–15)
BUN: 18 mg/dL (ref 8–23)
CO2: 25 mmol/L (ref 22–32)
Calcium: 9.1 mg/dL (ref 8.9–10.3)
Chloride: 100 mmol/L (ref 98–111)
Creatinine, Ser: 1.03 mg/dL — ABNORMAL HIGH (ref 0.44–1.00)
GFR, Estimated: 56 mL/min — ABNORMAL LOW (ref >60)
Glucose, Bld: 108 mg/dL — ABNORMAL HIGH (ref 70–99)
Potassium: 4.3 mmol/L (ref 3.5–5.1)
Sodium: 135 mmol/L (ref 135–145)
Total Bilirubin: 1.1 mg/dL (ref 0.3–1.2)
Total Protein: 7.5 g/dL (ref 6.5–8.1)

## 2020-12-02 LAB — CBC
HCT: 40.7 % (ref 36.0–46.0)
Hemoglobin: 13.2 g/dL (ref 12.0–15.0)
MCH: 30.8 pg (ref 26.0–34.0)
MCHC: 32.4 g/dL (ref 30.0–36.0)
MCV: 94.9 fL (ref 80.0–100.0)
Platelets: 218 10*3/uL (ref 150–400)
RBC: 4.29 MIL/uL (ref 3.87–5.11)
RDW: 12.2 % (ref 11.5–15.5)
WBC: 7.8 10*3/uL (ref 4.0–10.5)
nRBC: 0 % (ref 0.0–0.2)

## 2020-12-02 LAB — LACTIC ACID, PLASMA
Lactic Acid, Venous: 0.7 mmol/L (ref 0.5–1.9)
Lactic Acid, Venous: 0.7 mmol/L (ref 0.5–1.9)

## 2020-12-02 LAB — TROPONIN I (HIGH SENSITIVITY): Troponin I (High Sensitivity): 3 ng/L (ref <18)

## 2020-12-02 LAB — RESP PANEL BY RT-PCR (FLU A&B, COVID) ARPGX2
Influenza A by PCR: NEGATIVE
Influenza B by PCR: NEGATIVE
SARS Coronavirus 2 by RT PCR: POSITIVE — AB

## 2020-12-02 LAB — LIPASE, BLOOD: Lipase: 110 U/L — ABNORMAL HIGH (ref 11–51)

## 2020-12-02 MED ORDER — ONDANSETRON HCL 4 MG PO TABS
4.0000 mg | ORAL_TABLET | Freq: Three times a day (TID) | ORAL | 0 refills | Status: DC | PRN
Start: 2020-12-02 — End: 2020-12-20

## 2020-12-02 NOTE — Discharge Instructions (Addendum)
Your work-up today was overall reassuring aside from the very mild elevation in your lipase suggestive of mild pancreatitis as well as the positive Covid test.  I suspect you recently had COVID and do not actively have it now however please isolate as you were just diagnosed positive today.  Please rest and stay hydrated.  Please use the nausea medicine help with discomfort and use over-the-counter pain medicine with the nausea medicine to help with your discomfort.  Please rest and stay hydrated.  If any symptoms change or worsen, please return to the nearest emergency department

## 2020-12-02 NOTE — ED Provider Notes (Signed)
[name]   [name] [name]     History Chief Complaint  Patient presents with  . Abdominal Pain    [name] is a 80 y.o. female.  The history is provided by the patient, the spouse and medical records. No language interpreter was used.  Abdominal Pain Pain location:  Epigastric and RUQ Pain quality: aching and cramping   Pain radiates to:  L shoulder Pain severity:  Moderate Onset quality:  Gradual Duration:  3 days Timing:  Constant Progression:  Waxing and waning Chronicity:  Recurrent Context: not trauma   Relieved by:  Nothing Worsened by:  Nothing Ineffective treatments:  None tried Associated symptoms: belching and constipation   Associated symptoms: no chest pain, no chills, no cough, no diarrhea, no dysuria, no fatigue, no fever, no flatus, no nausea, no shortness of breath and no vomiting   Risk factors: has not had multiple surgeries        Past Medical History:  Diagnosis Date  . Back pain   . Chronic constipation   . Hyperlipidemia   . Hypertension   . Osteopenia     Patient Active Problem List   Diagnosis Date Noted  . Hematoma of left breast 01/02/2020  . Hyperkalemia 02/01/2018  . Screening mammogram, encounter for 01/19/2017  . Estrogen deficiency 01/19/2017  . Routine general medical examination at a health care facility 06/11/2015  . Encounter for Medicare annual wellness exam 11/14/2013  . Dermatitis 11/14/2013  . Family history of colon cancer 11/14/2013  . Colon cancer screening 11/14/2013  . Hypertension 01/27/2011  . Osteoporosis 01/27/2011  . Post-menopausal 01/27/2011  . Low back pain radiating to left leg 03/13/2008  . Hyperlipidemia 12/20/2007  . CONSTIPATION, CHRONIC 12/20/2007  . Prediabetes 12/20/2007    Past Surgical History:  Procedure Laterality Date  . CLEFT PALATE REPAIR    . FOOT NEUROMA SURGERY  2/06  . NEPHRECTOMY  LIVING DONOR     Left  . TUBAL LIGATION       OB History   No obstetric history on file.     Family History  Problem Relation Age of Onset  . Colon cancer Mother   . Diabetes Mother   . Coronary artery disease Mother   . Osteoporosis Mother   . Dementia Mother   . Coronary artery disease Father   . Heart failure Father   . Hypertension Father   . Stomach cancer Neg Hx     Social History   Tobacco Use  . Smoking status: Never Smoker  . Smokeless tobacco: Never Used  . Tobacco comment: smoked 1 year after high school, never since  Vaping Use  . Vaping Use: Never used  Substance Use Topics  . Alcohol use: No    Alcohol/week: 0.0 standard drinks  . Drug use: No    Home Medications Prior to Admission medications   Medication Sig Start Date End Date Taking? Authorizing Provider  aspirin 81 MG tablet Take 81 mg by mouth daily.    [provider]  atorvastatin (LIPITOR) 10 MG tablet Take 1 tablet (10 mg total) by mouth daily. 02/20/20   Tower, Wynelle Fanny, MD  bisacodyl (DULCOLAX) 5 MG EC tablet Take 5 mg by mouth daily as needed.      [provider]  Boswellia-Glucosamine-Vit D (OSTEO BI-FLEX ONE PER DAY PO) Take 1 tablet by mouth daily.    [provider]  Cholecalciferol (  VITAMIN D3 PO) Take 1 capsule by mouth daily.    [provider]  lisinopril (ZESTRIL) 5 MG tablet Take 1 tablet (5 mg total) by mouth daily. 02/20/20   Tower, Wynelle Fanny, MD  Multiple Vitamins-Minerals (CENTRUM SILVER) tablet Take 1 tablet by mouth daily.      [provider]    Allergies    Codeine and Hydrocodone-homatropine  Review of Systems   Review of Systems  Constitutional: Negative for chills, diaphoresis, fatigue and fever.  HENT: Negative for congestion.   Eyes: Negative for visual disturbance.  Respiratory: Negative for cough, chest tightness, shortness of breath and wheezing.   Cardiovascular: Negative for chest pain, palpitations and leg  swelling.  Gastrointestinal: Positive for abdominal pain and constipation. Negative for diarrhea, flatus, nausea and vomiting.  Genitourinary: Negative for dysuria.  Musculoskeletal: Negative for back pain.  Skin: Negative for rash and wound.  Neurological: Negative for syncope and light-headedness.  Psychiatric/Behavioral: Negative for agitation and confusion.  All other systems reviewed and are negative.   Physical Exam Updated Vital Signs BP (!) 123/95 (BP Location: Left Arm)   Pulse 91   Temp 98.2 F (36.8 C) (Oral)   Resp 16   Ht 5' 2.5" (1.588 m)   Wt 59.9 kg   SpO2 98%   BMI 23.76 kg/m   Physical Exam Vitals and nursing [name] reviewed.  Constitutional:      General: She is not in acute distress.    Appearance: She is well-developed and well-nourished. She is not ill-appearing, toxic-appearing or diaphoretic.  HENT:     Head: Normocephalic and atraumatic.  Eyes:     Conjunctiva/sclera: Conjunctivae normal.  Cardiovascular:     Rate and Rhythm: Normal rate and regular rhythm.     Heart sounds: Normal heart sounds. No murmur heard.   Pulmonary:     Effort: Pulmonary effort is normal. No respiratory distress.     Breath sounds: Normal breath sounds. No wheezing, rhonchi or rales.  Abdominal:     General: Abdomen is flat. Bowel sounds are normal.     Palpations: Abdomen is soft.     Tenderness: There is abdominal tenderness in the right upper quadrant and epigastric area. There is no right CVA tenderness, left CVA tenderness or guarding.    Musculoskeletal:        General: No edema.     Cervical back: Neck supple.  Skin:    General: Skin is warm and dry.  Neurological:     General: No focal deficit present.     Mental Status: She is alert.  Psychiatric:        Mood and Affect: Mood and affect and mood normal.     ED Results / Procedures / Treatments   Labs (all labs ordered are listed, but only abnormal results are displayed) Labs Reviewed  RESP PANEL  BY RT-PCR (FLU A&B, COVID) ARPGX2 - Abnormal; Notable for the following components:      Result Value   SARS Coronavirus 2 by RT PCR POSITIVE (*)    All other components within normal limits  LIPASE, BLOOD - Abnormal; Notable for the following components:   Lipase 110 (*)    All other components within normal limits  COMPREHENSIVE METABOLIC PANEL - Abnormal; Notable for the following components:   Glucose, Bld 108 (*)    Creatinine, Ser 1.03 (*)    GFR, Estimated 56 (*)    All other components within normal limits  URINALYSIS, ROUTINE W REFLEX MICROSCOPIC -  Abnormal; Notable for the following components:   Hgb urine dipstick SMALL (*)    All other components within normal limits  URINE CULTURE  CBC  LACTIC ACID, PLASMA  LACTIC ACID, PLASMA  TROPONIN I (HIGH SENSITIVITY)  TROPONIN I (HIGH SENSITIVITY)    EKG EKG Interpretation  Date/Time:  Thursday December 02 2020 11:16:11 EST Ventricular Rate:  88 PR Interval:    QRS Duration: 97 QT Interval:  377 QTC Calculation: 457 R Axis:   49 Text Interpretation: Sinus rhythm Low voltage, precordial leads No prior ECG for comparison.  Artifact present. No STEMI Confirmed by Antony Blackbird [id number] on 12/02/2020 11:20:16 AM   Radiology DG Chest 2 View  Result Date: 12/02/2020 CLINICAL DATA:  Epigastric pain EXAM: CHEST - 2 VIEW COMPARISON:  None. FINDINGS: Included upper abdomen is unremarkable. No consolidation or edema. No pleural effusion. Cardiomediastinal contours are within normal limits. Calcified plaques present along the aortic arch. No acute osseous abnormality. Midthoracic spine degenerative changes. IMPRESSION: No acute process in the chest. Electronically Signed   By: Macy Mis M.D.   On: 12/02/2020 14:41   US Abdomen Limited RUQ (LIVER/GB)  Result Date: 12/02/2020 CLINICAL DATA:  Abdominal pain. EXAM: ULTRASOUND ABDOMEN LIMITED RIGHT UPPER QUADRANT COMPARISON:  None. FINDINGS: Gallbladder: No gallstones or wall  thickening visualized. No sonographic Murphy sign noted by sonographer. Common bile duct: Diameter: 4 mm which is within normal limits. Liver: No focal lesion identified. Within normal limits in parenchymal echogenicity. Portal vein is patent on color Doppler imaging with normal direction of blood flow towards the liver. Other: None. IMPRESSION: No abnormality seen in the right upper quadrant of the abdomen. Electronically Signed   By: Marijo Conception M.D.   On: 12/02/2020 14:11    Procedures Procedures   Medications Ordered in ED Medications - No data to display  ED Course  I have reviewed the triage vital signs and the nursing notes.  Pertinent labs & imaging results that were available during my care of the patient were reviewed by me and considered in my medical decision making (see chart for details).    MDM Rules/Calculators/A&P                          ISHIKA Valerie is a 80 y.o. female with a past medical history significant for chronic constipation, hypertension, and osteopenia who presents with worsened epigastric and right upper quadrant abdominal pain.  Patient reports that for the last month or 2, she has had on and off episodes of upper abdominal discomfort.  She reports that last 3 days, it has come and it has been more severe.  She reports it is up to 5 out of 10 in severity and waxes and wanes.  She denies a history of gallbladder problems in the past.  She denies trauma.  She denies fevers or chills and also denies any difficulty with chest pain, shortness breath, or palpitations.  She does report today, she started having some aching in her left shoulder.  She denies any vomiting or significant nausea does report burping that is worsened.  She denies any lower abdominal discomfort or diarrhea.  She does report some chronic constipation which has been at its baseline.  She denies leg pain or leg swelling.  On exam, patient does have tenderness in the epigastric and right upper  quadrant area.  Her lungs were clear and chest was nontender and no murmur appreciated.  No focal  neurologic deficits.  Good pulses in upper and lower extremities.  No significant tenderness in the shoulder.  Back was nontender.  Vital signs reassuring on Valerie.  Clinically I am most concerned about her gallbladder however given her central upper epigastric discomfort and the discomfort in her left shoulder, we will get EKG, chest x-ray, and troponins.  We will get right upper quadrant ultrasound and screening labs of her abdomen.  We will get a Covid test in case she needs to go to surgery.  Anticipate reassessment after work-up.  She currently reports her pain is mild and wants to wait on work-up results for medications.  Anticipate reassessment.  Work-up returned showing slight elevation in her lipase and positive Covid test.  I suspect potentially very mild pancreatitis but otherwise work-up is reassuring.  Patient suspects may affect Covid recently as she had URI symptoms a week or 2 ago.  Do not suspect she has active Covid at this time.  Troponin negative and lactic acid normal x2.  Her chest x-ray and ultrasound are reassuring.  Given her improved symptoms, we feel she is safe for discharge home.  Patient agrees and is discharged to follow-up with PCP.   Final Clinical Impression(s) / ED Diagnoses Final diagnoses:  Epigastric abdominal pain  Acute pancreatitis, unspecified complication status, unspecified pancreatitis type  COVID-19    Rx / DC Orders ED Discharge Orders         Ordered    ondansetron (ZOFRAN) 4 MG tablet  Every 8 hours PRN        12/02/20 1521         Clinical Impression: 1. Acute pancreatitis, unspecified complication status, unspecified pancreatitis type   2. Epigastric abdominal pain   3. COVID-19     Disposition: Discharge  Condition: Good  I have discussed the results, Dx and Tx plan with the pt(& family if present). He/she/they expressed  understanding and agree(s) with the plan. Discharge instructions discussed at great length. Strict return precautions discussed and pt &/or family have verbalized understanding of the instructions. No further questions at time of discharge.    New Prescriptions   ONDANSETRON (ZOFRAN) 4 MG TABLET    Take 1 tablet (4 mg total) by mouth every 8 (eight) hours as needed for nausea or vomiting.    Follow Up: Tower, Wynelle Fanny, [place] [id number] [place] [facility] [place] [id number] mc [address] [place]       Nila Winker, Gwenyth Allegra, MD 12/02/20 [phone]

## 2020-12-02 NOTE — Telephone Encounter (Signed)
Per chart review tab pt is already at [facility] ED.

## 2020-12-02 NOTE — Telephone Encounter (Signed)
Aware and will watch for correspondence 

## 2020-12-02 NOTE — ED Triage Notes (Signed)
Patient c/o upper abdominal pain and "burping" x 3 days. Patient states she has had same symptoms x several months

## 2020-12-02 NOTE — ED Notes (Signed)
Family at bedside. 

## 2020-12-02 NOTE — Telephone Encounter (Signed)
[facility] - Client TELEPHONE ADVICE RECORD [facility] Patient Name: [name] Gender: Female DOB: 03-30-1941 Age: 80 Y 3 M 23 D Return Phone Number: [phone] (Primary), [phone] (Secondary) Address: City/State/Zip: [place]  [address] Client [facility] - Client Client Site [place] - Day Contact Type Call Who Is Calling Patient / Member / Family / Caregiver Call Type Triage / Clinical Relationship To Patient Self Return Phone Number [phone] (Primary) Chief Complaint Acid reflux/indigestion Reason for Call Symptomatic / Request for [place] states that she has acid reflux. [facility] Translation No Nurse Assessment Nurse: Raenette Rover, RN, Zella Ball Date/Time (Eastern Time): 12/02/2020 9:09:45 AM Confirm and document reason for call. If symptomatic, describe symptoms. ---Caller states that she has acid reflux. burping and tightness in the rib cage area at the stomach and excess of salvia. Has been going on for the past few days. Tried some gas x and only took two but hasn't helped. Does the patient have any new or worsening symptoms? ---Yes Will a triage be completed? ---Yes Related visit to physician within the last 2 weeks? ---No Does the PT have any chronic conditions? (i.e. diabetes, asthma, this includes High risk factors for pregnancy, etc.) ---Yes List chronic conditions. ---htn, cholesterol Is this a behavioral health or substance abuse call? ---No Guidelines Guideline Title Affirmed Question Affirmed Notes Nurse Date/Time Eilene Ghazi Time) Abdominal Pain - Upper [1] Pain lasts > 10 minutes AND [2] age > 45 Raenette Rover, RN, Zella Ball 12/02/2020 9:13:08 AM Disp. Time Eilene Ghazi Time) Disposition Final User 12/02/2020 9:17:37 AM Go to ED Now Yes Raenette Rover, RN, Herbert Deaner Disagree/Comply Comply Caller Understands Yes PLEASE  NOTE: All timestamps contained within this report are represented as [place] Standard Time. CONFIDENTIALTY NOTICE: This fax transmission is intended only for the addressee. It contains information that is legally privileged, confidential or otherwise protected from use or disclosure. If you are not the intended recipient, you are strictly prohibited from reviewing, disclosing, copying using or disseminating any of this information or taking any action in reliance on or regarding this information. If you have received this fax in error, please notify us immediately by telephone so that we can arrange for its return to [place]. Phone: [phone], Toll-Free: [phone], Fax: [phone] Page: 2 of 2 Call Id: [id number] PreDisposition Did not know what to do Care Advice Given Per Guideline GO TO ED NOW: * Another adult should drive. BRING MEDICINES: * Passes out or becomes too weak to stand * Severe difficulty breathing occurs. * Confusion occurs CALL EMS 911 IF: CARE ADVICE given per Abdominal Pain, Upper (Adult) guideline. Comments User: Wilson Singer, RN Date/Time Eilene Ghazi Time): 12/02/2020 9:13:57 AM tightness not in the chest but lower rib cage over the stomach. User: Wilson Singer, RN Date/Time Eilene Ghazi Time): 12/02/2020 9:14:57 AM Rates the discomfort 5/10 on scale feels bloated. Last bm this morning. Referrals GO TO FACILITY OTHER - SPECIFY

## 2020-12-03 ENCOUNTER — Encounter: Payer: Self-pay | Admitting: Family Medicine

## 2020-12-03 ENCOUNTER — Encounter: Payer: Self-pay | Admitting: Nurse Practitioner

## 2020-12-03 ENCOUNTER — Telehealth: Payer: Self-pay

## 2020-12-03 LAB — URINE CULTURE

## 2020-12-03 NOTE — Telephone Encounter (Signed)
Spoke with pt. She is unsure of when symptoms started. Runny nose,headache. States she will think about it and speak with her PCP.

## 2020-12-03 NOTE — Telephone Encounter (Signed)
Called to discuss with patient about COVID-19 symptoms and the use of one of the available treatments for those with mild to moderate Covid symptoms and at a high risk of hospitalization.  Pt appears to qualify for outpatient treatment due to co-morbid conditions and/or a member of an at-risk group in accordance with the FDA Emergency Use Authorization.    Symptom onset: 1-2 weeks ago per ED Vaccinated: Yes Booster? Yes Immunocompromised? No Qualifiers: HTN  Unable to reach pt - Wanted to verify symptoms and timeline. Left message and call back number [phone].  Marcello Moores

## 2020-12-05 ENCOUNTER — Telehealth: Payer: Medicare HMO | Admitting: Nurse Practitioner

## 2020-12-05 ENCOUNTER — Encounter: Payer: Self-pay | Admitting: Family Medicine

## 2020-12-05 DIAGNOSIS — R0602 Shortness of breath: Secondary | ICD-10-CM

## 2020-12-05 DIAGNOSIS — U071 COVID-19: Secondary | ICD-10-CM

## 2020-12-05 MED ORDER — ALBUTEROL SULFATE HFA 108 (90 BASE) MCG/ACT IN AERS: 2.0000 | INHALATION_SPRAY | Freq: Four times a day (QID) | RESPIRATORY_TRACT | 0 refills | Status: DC | PRN

## 2020-12-05 NOTE — Progress Notes (Signed)
E-Visit for Corona Virus Screening  We are sorry you are not feeling well. We are here to help!  You have tested positive for COVID-19, meaning that you were infected with the novel coronavirus and could give the virus to others.  It is vitally important that you stay home so you do not spread it to others.      Please continue isolation at home, for at least 10 days since the start of your symptoms and until you have had 24 hours with no fever (without taking a fever reducer) and with improving of symptoms.  If you have no symptoms but tested positive (or all symptoms resolve after 5 days and you have no fever) you can leave your house but continue to wear a mask around others for an additional 5 days. If you have a fever,continue to stay home until you have had 24 hours of no fever. Most cases improve 5-10 days from onset but we have seen a small number of patients who have gotten worse after the 10 days.  Please be sure to watch for worsening symptoms and remain taking the proper precautions.   Go to the nearest hospital ED for assessment if fever/cough/breathlessness are severe or illness seems like a threat to life.    The following symptoms may appear 2-14 days after exposure: . Fever . Cough . Shortness of breath or difficulty breathing . Chills . Repeated shaking with chills . Muscle pain . Headache . Sore throat . New loss of taste or smell . Fatigue . Congestion or runny nose . Nausea or vomiting . Diarrhea  You have been enrolled in [facility] for COVID-19. Daily you will receive a questionnaire within the [place] website. Our COVID-19 response team will be monitoring your responses daily.  You can use medication such as A prescription inhaler called Albuterol MDI 90 mcg /actuation 2 puffs every 4 hours as needed for shortness of breath, wheezing, cough  Please contact your primary care physician to arrange monoclonal antibody treatment.    You may also take  acetaminophen (Tylenol) as needed for fever.  HOME CARE: . Only take medications as instructed by your medical team. . Drink plenty of fluids and get plenty of rest. . A steam or ultrasonic humidifier can help if you have congestion.   GET HELP RIGHT AWAY IF YOU HAVE EMERGENCY WARNING SIGNS.  Call 911 or proceed to your closest emergency facility if: . You develop worsening high fever. . Trouble breathing . Bluish lips or face . Persistent pain or pressure in the chest . New confusion . Inability to wake or stay awake . You cough up blood. . Your symptoms become more severe . Inability to hold down food or fluids  This list is not all possible symptoms. Contact your medical provider for any symptoms that are severe or concerning to you.    Your e-visit answers were reviewed by a board certified advanced clinical practitioner to complete your personal care plan.  Depending on the condition, your plan could have included both over the counter or prescription medications.  If there is a problem please reply once you have received a response from your provider.  Your safety is important to [place].  If you have drug allergies check your prescription carefully.    You can use MyChart to ask questions about today's visit, request a non-urgent call back, or ask for a work or school excuse for 24 hours related to this e-Visit. If it has  been greater than 24 hours you will need to follow up with your provider, or enter a new e-Visit to address those concerns. You will get an e-mail in the next two days asking about your experience.  I hope that your e-visit has been valuable and will speed your recovery. Thank you for using e-visits.  I have spent at least 5 minutes reviewing and documenting in the patient's chart.

## 2020-12-06 ENCOUNTER — Telehealth: Payer: Self-pay

## 2020-12-06 NOTE — Telephone Encounter (Signed)
Spoke with pt. Informed her an APP with [facility] will return her call as requested. Verbalizes understanding.

## 2020-12-08 NOTE — Telephone Encounter (Signed)
See pt's update.

## 2020-12-09 ENCOUNTER — Other Ambulatory Visit: Payer: Self-pay | Admitting: Physician Assistant

## 2020-12-09 DIAGNOSIS — R7303 Prediabetes: Secondary | ICD-10-CM

## 2020-12-09 DIAGNOSIS — U071 COVID-19: Secondary | ICD-10-CM

## 2020-12-09 DIAGNOSIS — I10 Essential (primary) hypertension: Secondary | ICD-10-CM

## 2020-12-09 NOTE — Progress Notes (Signed)
I connected by phone with [name] on 12/09/2020 at 1:46 PM to discuss the potential use of a new treatment for mild to moderate COVID-19 viral infection in non-hospitalized patients.  This patient is a 80 y.o. female that meets the FDA criteria for Emergency Use Authorization of COVID monoclonal antibody sotrovimab.   Has a (+) direct SARS-CoV-2 viral test result  Has mild or moderate COVID-19   Is NOT hospitalized due to COVID-19  Is within 10 days of symptom onset  Has at least one of the high risk factor(s) for progression to severe COVID-19 and/or hospitalization as defined in EUA.  Specific high risk criteria : Older age (>/= 79 yo), Diabetes and Cardiovascular disease or hypertension   I have spoken and communicated the following to the patient or parent/caregiver regarding COVID monoclonal antibody treatment:  1. FDA has authorized the emergency use for the treatment of mild to moderate COVID-19 in adults and pediatric patients with positive results of direct SARS-CoV-2 viral testing who are 55 years of age and older weighing at least 40 kg, and who are at high risk for progressing to severe COVID-19 and/or hospitalization.  2. The significant known and potential risks and benefits of COVID monoclonal antibody, and the extent to which such potential risks and benefits are unknown.  3. Information on available alternative treatments and the risks and benefits of those alternatives, including clinical trials.  4. Patients treated with COVID monoclonal antibody should continue to self-isolate and use infection control measures (e.g., wear mask, isolate, social distance, avoid sharing personal items, clean and disinfect "high touch" surfaces, and frequent handwashing) according to CDC guidelines.   5. The patient or parent/caregiver has the option to accept or refuse COVID monoclonal antibody treatment.  After reviewing this information with the patient, the patient has agreed  to receive one of the available covid 19 monoclonal antibodies and will be provided an appropriate fact sheet prior to infusion.  Sx onset 2/24. Set up for infusion on 3/4 @ 10:30am. Directions given to [facility]. Pt is aware that insurance will be charged an infusion fee. Pt is fully vaccinated and boosted but outside of oral therapy window.   Angelena Form 12/09/2020 1:46 PM

## 2020-12-10 ENCOUNTER — Ambulatory Visit (HOSPITAL_COMMUNITY)
Admission: RE | Admit: 2020-12-10 | Discharge: 2020-12-10 | Disposition: A | Discharge status: Home / Self Care | Payer: Medicare HMO | Source: Ambulatory Visit | Attending: Pulmonary Disease | Admitting: Pulmonary Disease

## 2020-12-10 DIAGNOSIS — I10 Essential (primary) hypertension: Secondary | ICD-10-CM | POA: Diagnosis not present

## 2020-12-10 DIAGNOSIS — R7303 Prediabetes: Secondary | ICD-10-CM | POA: Diagnosis not present

## 2020-12-10 DIAGNOSIS — U071 COVID-19: Secondary | ICD-10-CM | POA: Insufficient documentation

## 2020-12-10 MED ORDER — METHYLPREDNISOLONE SODIUM SUCC 125 MG IJ SOLR: 125.0000 mg | Freq: Once | INTRAMUSCULAR | Status: DC | PRN

## 2020-12-10 MED ORDER — EPINEPHRINE 0.3 MG/0.3ML IJ SOAJ: 0.3000 mg | Freq: Once | INTRAMUSCULAR | Status: DC | PRN

## 2020-12-10 MED ORDER — FAMOTIDINE IN NACL 20-0.9 MG/50ML-% IV SOLN: 20.0000 mg | Freq: Once | INTRAVENOUS | Status: DC | PRN

## 2020-12-10 MED ORDER — DIPHENHYDRAMINE HCL 50 MG/ML IJ SOLN: 50.0000 mg | Freq: Once | INTRAMUSCULAR | Status: DC | PRN

## 2020-12-10 MED ORDER — ALBUTEROL SULFATE HFA 108 (90 BASE) MCG/ACT IN AERS: 2.0000 | INHALATION_SPRAY | Freq: Once | RESPIRATORY_TRACT | Status: DC | PRN

## 2020-12-10 MED ORDER — SODIUM CHLORIDE 0.9 % IV SOLN: INTRAVENOUS | Status: DC | PRN

## 2020-12-10 MED ORDER — SOTROVIMAB 500 MG/8ML IV SOLN
500.0000 mg | Freq: Once | INTRAVENOUS | Status: AC
  Administered 2020-12-10: 500 mg via INTRAVENOUS

## 2020-12-10 NOTE — Discharge Instructions (Signed)

## 2020-12-10 NOTE — Progress Notes (Signed)
Patient reviewed Fact Sheet for Patients, Parents, and Caregivers for Emergency Use Authorization (EUA) of sotrovimab for the Treatment of Coronavirus. Patient also reviewed and is agreeable to the estimated cost of treatment. Patient is agreeable to proceed.   

## 2020-12-10 NOTE — Progress Notes (Signed)
Diagnosis: COVID-19  Physician: Dr. Patrick Wright  Procedure: [facility] Med: Sotrovimab infusion - Provided patient with sotrovimab fact sheet for patients, parents, and caregivers prior to infusion.   Complications: No immediate complications noted  Discharge: Discharged home    

## 2020-12-19 NOTE — Progress Notes (Signed)
12/19/2020 [name] [id number] May 06, 1941   CHIEF COMPLAINT: pancreatitis follow up  HISTORY OF PRESENT ILLNESS:  [name]. [name] is a 80 year old female with a past medical history of hypertension, hyperlipidemia, osteopenia, back pain and chronic constipation.  Past left nephrectomy (kidney donor) 1996, tubal ligation and cleft palate repair.  She presents to our office today as referred by Dr. Glori Bickers for further evaluation regarding epigastric pain.  She developed epigastric pain which radiated to the lower sternum upon awakening in the morning on 12/02/2020.  She contacted Dr. Glori Bickers and she was advised to go to the ED for further evaluation.  Labs in the ED showed a positive SARS coronavirus without respiratory symptoms.  Lipase 110. Normal LFTs. Cr 1.03. WBC 7.8. Hg 13.2.  EKG without acute ischemia. Chest xray was negative. RUQ sonogram was negative.  She was diagnosed with mild acute pancreatitis and she was discharged home on Ondansetron 4 mg as needed.  She presents for further GI evaluation.  Currently, she reports feeling quite well.  She has increased burping with minimal epigastric discomfort.  No right upper quadrant abdominal pain.  No nausea or vomiting.  No heartburn or dysphagia.  No alcohol use.  She is passing a normal formed bowel movement approximately 3 days weekly.  She takes a Dulcolax tablet once weekly.  No rectal bleeding or melena.  Her most recent colonoscopy by Dr. Henrene Pastor was 01/02/2014 which was normal, no polyps.  Normal colonoscopies in 1998, 2003 and 2008. Mother with history of colon cancer.  No weight loss.  No fever, sweats or chills.  CBC Latest Ref Rng & Units 12/02/2020 02/17/2020 02/12/2019  WBC 4.0 - 10.5 K/uL 7.8 6.6 6.6  Hemoglobin 12.0 - 15.0 g/dL 13.2 12.8 13.1  Hematocrit 36.0 - 46.0 % 40.7 37.5 39.0  Platelets 150 - 400 K/uL 218 217.0 205.0   CMP Latest Ref Rng & Units 12/02/2020 02/17/2020 03/06/2019  Glucose 70 - 99 mg/dL 108(H) 110(H) 101(H)   BUN 8 - 23 mg/dL 18 18 18   Creatinine 0.44 - 1.00 mg/dL 1.03(H) 1.00 1.07  Sodium 135 - 145 mmol/L 135 140 140  Potassium 3.5 - 5.1 mmol/L 4.3 5.1 4.9  Chloride 98 - 111 mmol/L 100 106 106  CO2 22 - 32 mmol/L 25 29 28   Calcium 8.9 - 10.3 mg/dL 9.1 9.5 9.0  Total Protein 6.5 - 8.1 g/dL 7.5 6.7 -  Total Bilirubin 0.3 - 1.2 mg/dL 1.1 0.3 -  Alkaline Phos 38 - 126 U/L 95 85 -  AST 15 - 41 U/L 23 18 -  ALT 0 - 44 U/L 13 14 -    RUQ sonogram 12/02/2020: Gallbladder:No gallstones or wall thickening visualized. No sonographic Murphy sign noted by sonographer. Common bile duct:Diameter: 4 mm which is within normal limits. Liver:No focal lesion identified. Within normal limits in parenchymal echogenicity. Portal vein is patent on color Doppler imaging with normal direction of blood flow towards the liver. IMPRESSION: No abnormality seen in the right upper quadrant of the abdomen.   Past Medical History:  Diagnosis Date  . Back pain   . Chronic constipation   . Hyperlipidemia   . Hypertension   . Osteopenia    Past Surgical History:  Procedure Laterality Date  . CLEFT PALATE REPAIR    . FOOT NEUROMA SURGERY  2/06  . NEPHRECTOMY LIVING DONOR     Left  . TUBAL LIGATION     Social History: She is married.  She has 1 son and 1 daughter.  She is retired.  She is a non-smoker.  No alcohol use.  No drug use.  Family History: Mother deceased age 9 with history of diabetes, heart disease and colon cancer diagnosed in her 47's or 41's. Father deceased age 52 heart attack. Brother age 30  heart disease and recent pneumonia.   Allergies  Allergen Reactions  . Codeine   . Hydrocodone-Homatropine     Caused flu-like symptoms      Outpatient Encounter Medications as of 12/20/2020  Medication Sig  . albuterol (VENTOLIN HFA) 108 (90 Base) MCG/ACT inhaler Inhale 2 puffs into the lungs every 6 (six) hours as needed for up to 10 days for wheezing or shortness of breath.  Marland Kitchen aspirin 81  MG tablet Take 81 mg by mouth daily.  Marland Kitchen atorvastatin (LIPITOR) 10 MG tablet Take 1 tablet (10 mg total) by mouth daily.  . Cholecalciferol (EQL VITAMIN D3) 125 MCG (5000 UT) capsule Take 5,000 Units by mouth daily.  Marland Kitchen ibuprofen (ADVIL) 200 MG tablet Take 400 mg by mouth every 6 (six) hours as needed for fever, headache or mild pain.  Marland Kitchen lisinopril (ZESTRIL) 5 MG tablet Take 1 tablet (5 mg total) by mouth daily.  . ondansetron (ZOFRAN) 4 MG tablet Take 1 tablet (4 mg total) by mouth every 8 (eight) hours as needed for nausea or vomiting.  . simethicone (MYLICON) 80 MG chewable tablet Chew 80 mg by mouth every 6 (six) hours as needed for flatulence.   No facility-administered encounter medications on file as of 12/20/2020.   REVIEW OF SYSTEMS:  Gen: Denies fever, sweats or chills. No weight loss.  CV: Denies chest pain, palpitations or edema. Resp: Denies cough, shortness of breath of hemoptysis.  GI: See HPI. GU : Denies urinary burning, blood in urine, increased urinary frequency or incontinence. MS: Denies joint pain, muscles aches or weakness. Derm: Denies rash, itchiness, skin lesions or unhealing ulcers. Psych: Denies depression, anxiety or memory loss Heme: Denies bruising, bleeding. Neuro:  + Headaches. No dizziness or paresthesias. Endo:  Denies any problems with DM, thyroid or adrenal function.  PHYSICAL EXAM: BP 110/72   Pulse 95   Ht 5' 2.5" (1.588 m)   Wt 139 lb 3.2 oz (63.1 kg)   SpO2 99%   BMI 25.05 kg/m    Wt Readings from Last 3 Encounters:  12/20/20 139 lb 3.2 oz (63.1 kg)  12/02/20 132 lb (59.9 kg)  05/07/20 136 lb 8 oz (61.9 kg)   General: Well developed 80 year old female in no acute distress. Head: Normocephalic and atraumatic. Eyes:  Sclerae non-icteric, conjunctive pink. Ears: Normal auditory acuity. Mouth: Dentition intact. No ulcers or lesions.  Neck: Supple, no lymphadenopathy or thyromegaly.  Lungs: Clear bilaterally to auscultation without wheezes,  crackles or rhonchi. Heart: Regular rate and rhythm. No murmur, rub or gallop appreciated.  Abdomen: Soft, non distended. Mild epigastric tenderness. No masses. No hepatosplenomegaly. Normoactive bowel sounds x 4 quadrants.  Rectal: Deferred.  Musculoskeletal: Symmetrical with no gross deformities. Skin: Warm and dry. No rash or lesions on visible extremities. Extremities: No edema. Neurological: Alert oriented x 4, no focal deficits.  Psychological:  Alert and cooperative. Normal mood and affect.  ASSESSMENT AND PLAN:  86. 80 year old female with intermittent epigastric pain over the past few months with significant epigastric pain on 12/02/2020. Labs in the ED showed an elevated lipase level of 110 with normal LFTs. Sars corona virus 2 positive. Normal  RUQ sonogram 12/02/2020.  -Check lipase, amylase and CMP level today -If lipase level continues and epigastric pain persists or worsens consider CTAP with oral and IV contrast with caution as the patient has 1 functioning kidney (past nephrectomy/kidney donor) -Famotidine 20mg  QD -EGD benefits and risks discussed including risk with sedation, risk of bleeding, perforation and infection  -Dr. Henrene Pastor to review the above plan prior to the patient proceeding with endoscopic evaluation  2. Covid 19 infection 12/02/2020. Negative chest xray.  3.  Colon cancer screening.  Colonoscopies in her lifetime without colon polyps.  Her most recent colonoscopy in 2015 was normal. -No further colonoscopies unless medically indicated   CC:  Tower, Wynelle Fanny, MD

## 2020-12-20 ENCOUNTER — Encounter: Payer: Self-pay | Admitting: Nurse Practitioner

## 2020-12-20 ENCOUNTER — Ambulatory Visit: Payer: Medicare HMO | Admitting: Nurse Practitioner

## 2020-12-20 ENCOUNTER — Other Ambulatory Visit (INDEPENDENT_AMBULATORY_CARE_PROVIDER_SITE_OTHER): Payer: Medicare HMO

## 2020-12-20 VITALS — BP 110/72 | HR 95 | Ht 62.5 in | Wt 139.2 lb

## 2020-12-20 DIAGNOSIS — R748 Abnormal levels of other serum enzymes: Secondary | ICD-10-CM

## 2020-12-20 DIAGNOSIS — R1013 Epigastric pain: Secondary | ICD-10-CM | POA: Diagnosis not present

## 2020-12-20 LAB — COMPREHENSIVE METABOLIC PANEL
ALT: 13 U/L (ref 0–35)
AST: 16 U/L (ref 0–37)
Albumin: 4.1 g/dL (ref 3.5–5.2)
Alkaline Phosphatase: 92 U/L (ref 39–117)
BUN: 16 mg/dL (ref 6–23)
CO2: 29 mEq/L (ref 19–32)
Calcium: 9.2 mg/dL (ref 8.4–10.5)
Chloride: 103 mEq/L (ref 96–112)
Creatinine, Ser: 0.95 mg/dL (ref 0.40–1.20)
GFR: 57.44 mL/min — ABNORMAL LOW (ref >60.00)
Glucose, Bld: 96 mg/dL (ref 70–99)
Potassium: 4.6 mEq/L (ref 3.5–5.1)
Sodium: 139 mEq/L (ref 135–145)
Total Bilirubin: 0.4 mg/dL (ref 0.2–1.2)
Total Protein: 6.8 g/dL (ref 6.0–8.3)

## 2020-12-20 LAB — AMYLASE: Amylase: 45 U/L (ref 27–131)

## 2020-12-20 LAB — LIPASE: Lipase: 25 U/L (ref 11.0–59.0)

## 2020-12-20 NOTE — Patient Instructions (Addendum)
If you are age 79 or older, your body mass index should be between 23-30. Your Body mass index is 25.05 kg/m. If this is out of the aforementioned range listed, please consider follow up with your Primary Care Provider.  LABS:  Lab work has been ordered for you today. Our lab is located in the basement. Press "B" on the elevator. The lab is located at the first door on the left as you exit the elevator.  HEALTHCARE LAWS AND MY CHART RESULTS: Due to recent changes in healthcare laws, you may see the results of your imaging and laboratory studies on MyChart before your provider has had a chance to review them.   We understand that in some cases there may be results that are confusing or concerning to you. Not all laboratory results come back in the same time frame and the provider may be waiting for multiple results in order to interpret others.  Please give [place] 48 hours in order for your provider to thoroughly review all the results before contacting the office for clarification of your results.    PROCEDURES: You have been scheduled for a endoscopy. Please follow the written instructions given to you at your visit today. If you use inhalers (even only as needed), please bring them with you on the day of your procedure.  OVER THE COUNTER MEDICATION Please purchase the following medications over the counter and take as directed:  Famotidine 20 MG once a day. Please call our office if your symptoms worsen.  It was great seeing you today! Thank you for entrusting me with your care and choosing [facility].  Noralyn Pick, CRNP

## 2020-12-21 ENCOUNTER — Other Ambulatory Visit: Payer: Self-pay

## 2020-12-21 DIAGNOSIS — R1013 Epigastric pain: Secondary | ICD-10-CM

## 2020-12-21 NOTE — Progress Notes (Signed)
Order entered in epic for CT A/P with pancreatic protocol. Rad scheduling will contact pt with the appt.

## 2020-12-21 NOTE — Progress Notes (Signed)
Assessment and plans reviewed. In addition to EGD, the patient needs advanced imaging of her pancreas.  Agree with CT scan with pancreatic protocol (normal kidney function noted)

## 2020-12-21 NOTE — Progress Notes (Signed)
Beth, pls contact the patient and schedule her for a pancreatic protocol CT with IV contrast, refer to Dr. Blanch Media addendum: I called the patient and she is aware you will call her to schedule this CT. She was informed to hydrate well a few days before the CT, day of CT and day after CT. She will proceed with an EGD and scheduled. THX

## 2020-12-22 DIAGNOSIS — M8580 Other specified disorders of bone density and structure, unspecified site: Secondary | ICD-10-CM | POA: Diagnosis not present

## 2020-12-22 DIAGNOSIS — I10 Essential (primary) hypertension: Secondary | ICD-10-CM | POA: Diagnosis not present

## 2020-12-22 DIAGNOSIS — K5900 Constipation, unspecified: Secondary | ICD-10-CM | POA: Diagnosis not present

## 2020-12-22 DIAGNOSIS — Z809 Family history of malignant neoplasm, unspecified: Secondary | ICD-10-CM | POA: Diagnosis not present

## 2020-12-22 DIAGNOSIS — M1990 Unspecified osteoarthritis, unspecified site: Secondary | ICD-10-CM | POA: Diagnosis not present

## 2020-12-22 DIAGNOSIS — Z7982 Long term (current) use of aspirin: Secondary | ICD-10-CM | POA: Diagnosis not present

## 2020-12-22 DIAGNOSIS — Z008 Encounter for other general examination: Secondary | ICD-10-CM | POA: Diagnosis not present

## 2020-12-22 DIAGNOSIS — R32 Unspecified urinary incontinence: Secondary | ICD-10-CM | POA: Diagnosis not present

## 2020-12-22 DIAGNOSIS — E785 Hyperlipidemia, unspecified: Secondary | ICD-10-CM | POA: Diagnosis not present

## 2020-12-22 DIAGNOSIS — Z823 Family history of stroke: Secondary | ICD-10-CM | POA: Diagnosis not present

## 2020-12-22 DIAGNOSIS — G8929 Other chronic pain: Secondary | ICD-10-CM | POA: Diagnosis not present

## 2020-12-24 ENCOUNTER — Telehealth: Payer: Self-pay | Admitting: Nurse Practitioner

## 2020-12-24 NOTE — Telephone Encounter (Signed)
Pt calling to follow up on the message below about scheduling a Ct scan. Pls call her.

## 2020-12-24 NOTE — Telephone Encounter (Signed)
Returned pt call and advised her to call schedulers to set up CT scan.  Order entered and message sent to Rhys Martini and April Pait.  The pt was given the number [phone] to get the appt scheduled.

## 2020-12-24 NOTE — Telephone Encounter (Signed)
Please send to Elbert thanks

## 2020-12-24 NOTE — Telephone Encounter (Signed)
Pt is requesting a call back to schedule her CT scan.

## 2020-12-28 ENCOUNTER — Other Ambulatory Visit: Payer: Self-pay | Admitting: Nurse Practitioner

## 2020-12-28 DIAGNOSIS — U071 COVID-19: Secondary | ICD-10-CM

## 2020-12-28 DIAGNOSIS — R0602 Shortness of breath: Secondary | ICD-10-CM

## 2021-01-04 ENCOUNTER — Other Ambulatory Visit: Payer: Self-pay

## 2021-01-04 ENCOUNTER — Ambulatory Visit (HOSPITAL_COMMUNITY)
Admission: RE | Admit: 2021-01-04 | Discharge: 2021-01-04 | Disposition: A | Discharge status: Home / Self Care | Payer: Medicare HMO | Source: Ambulatory Visit | Attending: Nurse Practitioner | Admitting: Nurse Practitioner

## 2021-01-04 DIAGNOSIS — R109 Unspecified abdominal pain: Secondary | ICD-10-CM | POA: Diagnosis not present

## 2021-01-04 DIAGNOSIS — R1013 Epigastric pain: Secondary | ICD-10-CM

## 2021-01-04 MED ORDER — IOHEXOL 300 MG/ML  SOLN
100.0000 mL | Freq: Once | INTRAMUSCULAR | Status: AC | PRN
  Administered 2021-01-04: 100 mL via INTRAVENOUS

## 2021-01-06 ENCOUNTER — Encounter: Payer: Self-pay | Admitting: Internal Medicine

## 2021-01-06 ENCOUNTER — Other Ambulatory Visit: Payer: Self-pay

## 2021-01-06 ENCOUNTER — Ambulatory Visit (AMBULATORY_SURGERY_CENTER): Payer: Medicare HMO | Admitting: Internal Medicine

## 2021-01-06 VITALS — BP 101/59 | HR 73 | Temp 97.3°F | Resp 18 | Ht 62.0 in | Wt 139.0 lb

## 2021-01-06 DIAGNOSIS — K259 Gastric ulcer, unspecified as acute or chronic, without hemorrhage or perforation: Secondary | ICD-10-CM | POA: Diagnosis not present

## 2021-01-06 DIAGNOSIS — K2970 Gastritis, unspecified, without bleeding: Secondary | ICD-10-CM | POA: Diagnosis not present

## 2021-01-06 DIAGNOSIS — R1013 Epigastric pain: Secondary | ICD-10-CM

## 2021-01-06 DIAGNOSIS — K2950 Unspecified chronic gastritis without bleeding: Secondary | ICD-10-CM | POA: Diagnosis not present

## 2021-01-06 DIAGNOSIS — K219 Gastro-esophageal reflux disease without esophagitis: Secondary | ICD-10-CM | POA: Diagnosis not present

## 2021-01-06 MED ORDER — PANTOPRAZOLE SODIUM 40 MG PO TBEC: 40.0000 mg | DELAYED_RELEASE_TABLET | Freq: Two times a day (BID) | ORAL | 11 refills | Status: DC

## 2021-01-06 MED ORDER — SODIUM CHLORIDE 0.9 % IV SOLN: 500.0000 mL | Freq: Once | INTRAVENOUS | Status: DC

## 2021-01-06 NOTE — Progress Notes (Signed)
VS by CW  I have reviewed the patient's medical history in detail and updated the computerized patient record.  

## 2021-01-06 NOTE — Op Note (Signed)
[facility] Patient Name: [name] Procedure Date: 01/06/2021 10:01 AM MRN: [id number] Endoscopist: Docia Chuck. Henrene Pastor , MD Age: 80 Referring MD:  Date of Birth: 1940-12-12 Gender: Female Account #: [id number] Procedure:                Upper GI endoscopy with biopsies Indications:              Epigastric abdominal pain, mild elevation of                            lipase. Negative CT. Feeling better Medicines:                Monitored Anesthesia Care Procedure:                Pre-Anesthesia Assessment:                           - Prior to the procedure, a History and Physical                            was performed, and patient medications and                            allergies were reviewed. The patient's tolerance of                            previous anesthesia was also reviewed. The risks                            and benefits of the procedure and the sedation                            options and risks were discussed with the patient.                            All questions were answered, and informed consent                            was obtained. Prior Anticoagulants: The patient has                            taken no previous anticoagulant or antiplatelet                            agents. ASA Grade Assessment: II - A patient with                            mild systemic disease. After reviewing the risks                            and benefits, the patient was deemed in                            satisfactory condition to undergo the procedure.  After obtaining informed consent, the endoscope was                            passed under direct vision. Throughout the                            procedure, the patient's blood pressure, pulse, and                            oxygen saturations were monitored continuously. The                            Endoscope was introduced through the mouth, and                            advanced to  the second part of duodenum. The upper                            GI endoscopy was accomplished without difficulty.                            The patient tolerated the procedure well. Scope In: Scope Out: Findings:                 The esophagus revealed mild distal esophagitis as                            manifested by mucosal edema and erythema. There was                            also early stricture formation. No Barrett's                            esophagus.                           Many gastric ulcers were found in the prepyloric                            gastric antrum as well as a few diminutive ulcers                            in the body. Biopsies were taken with a cold                            forceps for histology.                           The examined duodenum was normal.                           The cardia and gastric fundus were normal on                            retroflexion, save small hiatal  hernia. Complications:            No immediate complications. Estimated Blood Loss:     Estimated blood loss: none. Impression:               1. Esophagitis and esophageal stricture                           2. Multiple gastric ulcers. Status post biopsies                           3. Otherwise normal EGD. Recommendation:           1. Prescribe pantoprazole 40 mg twice daily; #60;                            11 refills                           2. Avoid NSAIDs such as Advil, ibuprofen, naproxen                           3. Resume previous diet                           4. Schedule repeat upper endoscopy in the [place] in 8                            weeks to assess for ulcer healing Docia Chuck. Henrene Pastor, MD 01/06/2021 10:23:08 AM This report has been signed electronically.

## 2021-01-06 NOTE — Patient Instructions (Signed)
Handouts given for Esophagitis, Peptic Ulcer and Hiatal Hernia.  Pick up your new prescription at your pharmacy.  Avoid NSAIDS such as Advil, Ibuprofen and Naproxen.  Use Tylenol if needed.  Repeat EGD in 8 weeks to assess for ulcer healing.  Office will arrange and call you.  YOU HAD AN ENDOSCOPIC PROCEDURE TODAY AT [facility]:   Refer to the procedure report that was given to you for any specific questions about what was found during the examination.  If the procedure report does not answer your questions, please call your gastroenterologist to clarify.  If you requested that your care partner not be given the details of your procedure findings, then the procedure report has been included in a sealed envelope for you to review at your convenience later.  YOU SHOULD EXPECT: Some feelings of bloating in the abdomen. Passage of more gas than usual.  Walking can help get rid of the air that was put into your GI tract during the procedure and reduce the bloating. If you had a lower endoscopy (such as a colonoscopy or flexible sigmoidoscopy) you may notice spotting of blood in your stool or on the toilet paper. If you underwent a bowel prep for your procedure, you may not have a normal bowel movement for a few days.  Please Note:  You might notice some irritation and congestion in your nose or some drainage.  This is from the oxygen used during your procedure.  There is no need for concern and it should clear up in a day or so.  SYMPTOMS TO REPORT IMMEDIATELY:   Following upper endoscopy (EGD)  Vomiting of blood or coffee ground material  New chest pain or pain under the shoulder blades  Painful or persistently difficult swallowing  New shortness of breath  Fever of 100F or higher  Black, tarry-looking stools  For urgent or emergent issues, a gastroenterologist can be reached at any hour by calling [phone]. Do not use MyChart messaging for urgent concerns.    DIET:   We do recommend a small meal at first, but then you may proceed to your regular diet.  Drink plenty of fluids but you should avoid alcoholic beverages for 24 hours.  ACTIVITY:  You should plan to take it easy for the rest of today and you should NOT DRIVE or use heavy machinery until tomorrow (because of the sedation medicines used during the test).    FOLLOW UP: Our staff will call the number listed on your records 48-72 hours following your procedure to check on you and address any questions or concerns that you may have regarding the information given to you following your procedure. If we do not reach you, we will leave a message.  We will attempt to reach you two times.  During this call, we will ask if you have developed any symptoms of COVID 19. If you develop any symptoms (ie: fever, flu-like symptoms, shortness of breath, cough etc.) before then, please call [phone].  If you test positive for Covid 19 in the 2 weeks post procedure, please call and report this information to [place].    If any biopsies were taken you will be contacted by phone or by letter within the next 1-3 weeks.  Please call us at [phone] if you have not heard about the biopsies in 3 weeks.    SIGNATURES/CONFIDENTIALITY: You and/or your care partner have signed paperwork which will be entered into your electronic medical record.  These  signatures attest to the fact that that the information above on your After Visit Summary has been reviewed and is understood.  Full responsibility of the confidentiality of this discharge information lies with you and/or your care-partner. 

## 2021-01-06 NOTE — Progress Notes (Signed)
A and O x3. Report to RN. Tolerated MAC anesthesia well.Teeth unchanged after procedure.

## 2021-01-06 NOTE — Progress Notes (Signed)
Called to room to assist during endoscopic procedure.  Patient ID and intended procedure confirmed with present staff. Received instructions for my participation in the procedure from the performing physician.  

## 2021-01-10 ENCOUNTER — Telehealth: Payer: Self-pay

## 2021-01-10 NOTE — Telephone Encounter (Signed)
Attempted to reach pt. With follow-up call following endoscopic procedure 01/06/2021.  LM on pt. Voice mail. Will try to reach pt. Again later today.

## 2021-01-10 NOTE — Telephone Encounter (Signed)
  Follow up Call-  Call back number 01/06/2021  Post procedure Call Back phone  # [phone]  Permission to leave phone message Yes  Some recent data might be hidden     Patient questions:  Do you have a fever, pain , or abdominal swelling? No. Pain Score  0 *  Have you tolerated food without any problems? Yes.    Have you been able to return to your normal activities? Yes.    Do you have any questions about your discharge instructions: Diet   No. Medications  No. Follow up visit  No.  Do you have questions or concerns about your Care? No.  Actions: * If pain score is 4 or above: No action needed, pain <4.  1. Have you developed a fever since your procedure? no  2.   Have you had an respiratory symptoms (SOB or cough) since your procedure?No 3.   Have you tested positive for COVID 19 since your procedure No 4.   Have you had any family members/close contacts diagnosed with the COVID 19 since your procedure?  No  If yes to any of these questions please route to Joylene John, RN and Joella Prince, RN

## 2021-01-12 ENCOUNTER — Encounter: Payer: Self-pay | Admitting: Internal Medicine

## 2021-02-17 ENCOUNTER — Ambulatory Visit: Payer: Medicare HMO

## 2021-02-22 ENCOUNTER — Ambulatory Visit (INDEPENDENT_AMBULATORY_CARE_PROVIDER_SITE_OTHER): Payer: Medicare HMO

## 2021-02-22 ENCOUNTER — Other Ambulatory Visit: Payer: Self-pay

## 2021-02-22 DIAGNOSIS — Z0000 Encounter for general adult medical examination without abnormal findings: Secondary | ICD-10-CM

## 2021-02-22 NOTE — Patient Instructions (Signed)
[name] , Thank you for taking time to come for your Medicare Wellness Visit. I appreciate your ongoing commitment to your health goals. Please review the following plan we discussed and let me know if I can assist you in the future.   Screening recommendations/referrals: Colonoscopy: no longer required  Mammogram: Up to date, completed 04/2020, due 04/2021 Bone Density: Up to date, completed 04/20/2020, due 04/2021 Recommended yearly ophthalmology/optometry visit for glaucoma screening and checkup Recommended yearly dental visit for hygiene and checkup  Vaccinations: Influenza vaccine: due Fall 2022  Pneumococcal vaccine: Completed series Tdap vaccine: decline-insurance  Shingles vaccine: completed 1 dose 07/23/2019, second dose due. Please get this at your local pharmacy.    Covid-19:Completed series  Advanced directives: Please bring a copy of your POA (Power of Attorney) and/or Living Will to your next appointment.   Conditions/risks identified: hypertension, hyperlipidemia   Next appointment: Follow up in one year for your annual wellness visit    Preventive Care 77 Years and Older, Female Preventive care refers to lifestyle choices and visits with your health care provider that can promote health and wellness. What does preventive care include?  A yearly physical exam. This is also called an annual well check.  Dental exams once or twice a year.  Routine eye exams. Ask your health care provider how often you should have your eyes checked.  Personal lifestyle choices, including:  Daily care of your teeth and gums.  Regular physical activity.  Eating a healthy diet.  Avoiding tobacco and drug use.  Limiting alcohol use.  Practicing safe sex.  Taking low-dose aspirin every day.  Taking vitamin and mineral supplements as recommended by your health care provider. What happens during an annual well check? The services and screenings done by your health care provider  during your annual well check will depend on your age, overall health, lifestyle risk factors, and family history of disease. Counseling  Your health care provider may ask you questions about your:  Alcohol use.  Tobacco use.  Drug use.  Emotional well-being.  Home and relationship well-being.  Sexual activity.  Eating habits.  History of falls.  Memory and ability to understand (cognition).  Work and work Statistician.  Reproductive health. Screening  You may have the following tests or measurements:  Height, weight, and BMI.  Blood pressure.  Lipid and cholesterol levels. These may be checked every 5 years, or more frequently if you are over 78 years old.  Skin check.  Lung cancer screening. You may have this screening every year starting at age 62 if you have a 30-pack-year history of smoking and currently smoke or have quit within the past 15 years.  Fecal occult blood test (FOBT) of the stool. You may have this test every year starting at age 75.  Flexible sigmoidoscopy or colonoscopy. You may have a sigmoidoscopy every 5 years or a colonoscopy every 10 years starting at age 35.  Hepatitis C blood test.  Hepatitis B blood test.  Sexually transmitted disease (STD) testing.  Diabetes screening. This is done by checking your blood sugar (glucose) after you have not eaten for a while (fasting). You may have this done every 1-3 years.  Bone density scan. This is done to screen for osteoporosis. You may have this done starting at age 71.  Mammogram. This may be done every 1-2 years. Talk to your health care provider about how often you should have regular mammograms. Talk with your health care provider about your test results,  treatment options, and if necessary, the need for more tests. Vaccines  Your health care provider may recommend certain vaccines, such as:  Influenza vaccine. This is recommended every year.  Tetanus, diphtheria, and acellular pertussis  (Tdap, Td) vaccine. You may need a Td booster every 10 years.  Zoster vaccine. You may need this after age 18.  Pneumococcal 13-valent conjugate (PCV13) vaccine. One dose is recommended after age 15.  Pneumococcal polysaccharide (PPSV23) vaccine. One dose is recommended after age 58. Talk to your health care provider about which screenings and vaccines you need and how often you need them. This information is not intended to replace advice given to you by your health care provider. Make sure you discuss any questions you have with your health care provider. Document Released: 10/22/2015 Document Revised: 06/14/2016 Document Reviewed: 07/27/2015 Elsevier Interactive Patient Education  2017 [place] Prevention in the Home Falls can cause injuries. They can happen to people of all ages. There are many things you can do to make your home safe and to help prevent falls. What can I do on the outside of my home?  Regularly fix the edges of walkways and driveways and fix any cracks.  Remove anything that might make you trip as you walk through a door, such as a raised step or threshold.  Trim any bushes or trees on the path to your home.  Use bright outdoor lighting.  Clear any walking paths of anything that might make someone trip, such as rocks or tools.  Regularly check to see if handrails are loose or broken. Make sure that both sides of any steps have handrails.  Any raised decks and porches should have guardrails on the edges.  Have any leaves, snow, or ice cleared regularly.  Use sand or salt on walking paths during winter.  Clean up any spills in your garage right away. This includes oil or grease spills. What can I do in the bathroom?  Use night lights.  Install grab bars by the toilet and in the tub and shower. Do not use towel bars as grab bars.  Use non-skid mats or decals in the tub or shower.  If you need to sit down in the shower, use a plastic, non-slip  stool.  Keep the floor dry. Clean up any water that spills on the floor as soon as it happens.  Remove soap buildup in the tub or shower regularly.  Attach bath mats securely with double-sided non-slip rug tape.  Do not have throw rugs and other things on the floor that can make you trip. What can I do in the bedroom?  Use night lights.  Make sure that you have a light by your bed that is easy to reach.  Do not use any sheets or blankets that are too big for your bed. They should not hang down onto the floor.  Have a firm chair that has side arms. You can use this for support while you get dressed.  Do not have throw rugs and other things on the floor that can make you trip. What can I do in the kitchen?  Clean up any spills right away.  Avoid walking on wet floors.  Keep items that you use a lot in easy-to-reach places.  If you need to reach something above you, use a strong step stool that has a grab bar.  Keep electrical cords out of the way.  Do not use floor polish or wax that makes floors  slippery. If you must use wax, use non-skid floor wax.  Do not have throw rugs and other things on the floor that can make you trip. What can I do with my stairs?  Do not leave any items on the stairs.  Make sure that there are handrails on both sides of the stairs and use them. Fix handrails that are broken or loose. Make sure that handrails are as long as the stairways.  Check any carpeting to make sure that it is firmly attached to the stairs. Fix any carpet that is loose or worn.  Avoid having throw rugs at the top or bottom of the stairs. If you do have throw rugs, attach them to the floor with carpet tape.  Make sure that you have a light switch at the top of the stairs and the bottom of the stairs. If you do not have them, ask someone to add them for you. What else can I do to help prevent falls?  Wear shoes that:  Do not have high heels.  Have rubber bottoms.  Are  comfortable and fit you well.  Are closed at the toe. Do not wear sandals.  If you use a stepladder:  Make sure that it is fully opened. Do not climb a closed stepladder.  Make sure that both sides of the stepladder are locked into place.  Ask someone to hold it for you, if possible.  Clearly mark and make sure that you can see:  Any grab bars or handrails.  First and last steps.  Where the edge of each step is.  Use tools that help you move around (mobility aids) if they are needed. These include:  Canes.  Walkers.  Scooters.  Crutches.  Turn on the lights when you go into a dark area. Replace any light bulbs as soon as they burn out.  Set up your furniture so you have a clear path. Avoid moving your furniture around.  If any of your floors are uneven, fix them.  If there are any pets around you, be aware of where they are.  Review your medicines with your doctor. Some medicines can make you feel dizzy. This can increase your chance of falling. Ask your doctor what other things that you can do to help prevent falls. This information is not intended to replace advice given to you by your health care provider. Make sure you discuss any questions you have with your health care provider. Document Released: 07/22/2009 Document Revised: 03/02/2016 Document Reviewed: 10/30/2014 Elsevier Interactive Patient Education  2017 [employer].

## 2021-02-22 NOTE — Progress Notes (Signed)
Subjective:   [name] is a 80 y.o. female who presents for Medicare Annual (Subsequent) preventive examination.  Review of Systems: N/A      I connected with the patient today by telephone and verified that I am speaking with the correct person using two identifiers. Location patient: home Location nurse: work Persons participating in the telephone visit: patient, nurse.   I discussed the limitations, risks, security and privacy concerns of performing an evaluation and management service by telephone and the availability of in person appointments. I also discussed with the patient that there may be a patient responsible charge related to this service. The patient expressed understanding and verbally consented to this telephonic visit.        Cardiac Risk Factors include: advanced age (>10men, >40 women);hypertension;Other (see comment), Risk factor comments: hyperlipidemia     Objective:    Today's Vitals   There is no height or weight on file to calculate BMI.  Advanced Directives 02/22/2021 12/02/2020 02/17/2020 01/29/2018 01/12/2017  Does Patient Have a Medical Advance Directive? Yes Yes Yes Yes Yes  Type of Paramedic of [place];Living will [place];Living will [place];Living will [place];Living will [place];Living will  Copy of [place] in Chart? No - copy requested - No - copy requested No - copy requested No - copy requested    Current Medications (verified) Outpatient Encounter Medications as of 02/22/2021  Medication Sig  . aspirin 81 MG tablet Take 81 mg by mouth daily.  Marland Kitchen atorvastatin (LIPITOR) 10 MG tablet Take 1 tablet (10 mg total) by mouth daily.  . Cholecalciferol (EQL VITAMIN D3) 125 MCG (5000 UT) capsule Take 5,000 Units by mouth daily.  Marland Kitchen ibuprofen (ADVIL) 200 MG tablet Take 400 mg by mouth every 6 (six) hours as needed for fever,  headache or mild pain.  Marland Kitchen lisinopril (ZESTRIL) 5 MG tablet Take 1 tablet (5 mg total) by mouth daily.  . pantoprazole (PROTONIX) 40 MG tablet Take 1 tablet (40 mg total) by mouth 2 (two) times daily.  Marland Kitchen albuterol (VENTOLIN HFA) 108 (90 Base) MCG/ACT inhaler Inhale 2 puffs into the lungs every 6 (six) hours as needed for up to 10 days for wheezing or shortness of breath.   No facility-administered encounter medications on file as of 02/22/2021.    Allergies (verified) Codeine and Hydrocodone bit-homatrop mbr   History: Past Medical History:  Diagnosis Date  . Back pain   . Chronic constipation   . Hyperlipidemia   . Hypertension   . Osteopenia    Past Surgical History:  Procedure Laterality Date  . CLEFT PALATE REPAIR    . FOOT NEUROMA SURGERY  2/06  . NEPHRECTOMY LIVING DONOR     Left  . TUBAL LIGATION     Family History  Problem Relation Age of Onset  . Colon cancer Mother   . Diabetes Mother   . Coronary artery disease Mother   . Osteoporosis Mother   . Dementia Mother   . Coronary artery disease Father   . Heart failure Father   . Hypertension Father   . Stomach cancer Neg Hx   . Pancreatic cancer Neg Hx   . Esophageal cancer Neg Hx   . Liver disease Neg Hx    Social History   Socioeconomic History  . Marital status: Married    Spouse name: Not on file  . Number of children: Not on file  .  Years of education: Not on file  . Highest education level: Not on file  Occupational History  . Occupation: works at [employer]  . Smoking status: Never Smoker  . Smokeless tobacco: Never Used  Vaping Use  . Vaping Use: Never used  Substance and Sexual Activity  . Alcohol use: No  . Drug use: No  . Sexual activity: Not Currently  Other Topics Concern  . Not on file  Social History Narrative  . Not on file   Social Determinants of Health   Financial Resource Strain: Low Risk   . Difficulty of Paying Living Expenses: Not hard at all  Food  Insecurity: No Food Insecurity  . Worried About Charity fundraiser in the Last Year: Never true  . Ran Out of Food in the Last Year: Never true  Transportation Needs: No Transportation Needs  . Lack of Transportation (Medical): No  . Lack of Transportation (Non-Medical): No  Physical Activity: Inactive  . Days of Exercise per Week: 0 days  . Minutes of Exercise per Session: 0 min  Stress: No Stress Concern Present  . Feeling of Stress : Not at all  Social Connections: Not on file    Tobacco Counseling Counseling given: Not Answered   Clinical Intake:  Pre-visit preparation completed: Yes  Pain : No/denies pain     Nutritional Risks: None Diabetes: No  How often do you need to have someone help you when you read instructions, pamphlets, or other written materials from your doctor or pharmacy?: 1 - Never  Diabetic: No Nutrition Risk Assessment:  Has the patient had any N/V/D within the last 2 months?  No  Does the patient have any non-healing wounds?  No  Has the patient had any unintentional weight loss or weight gain?  No   Diabetes:  Is the patient diabetic?  No  If diabetic, was a CBG obtained today?  N/A Did the patient bring in their glucometer from home?  N/A How often do you monitor your CBG's? N/A.   Financial Strains and Diabetes Management:  Are you having any financial strains with the device, your supplies or your medication? N/A.  Does the patient want to be seen by Chronic Care Management for management of their diabetes?  N/A Would the patient like to be referred to a Nutritionist or for Diabetic Management?  N/A   Interpreter Needed?: No  Information entered by :: CJohnson, LPN   Activities of Daily Living In your present state of health, do you have any difficulty performing the following activities: 02/22/2021  Hearing? Y  Comment some hearing loss noted  Vision? N  Difficulty concentrating or making decisions? N  Walking or climbing  stairs? N  Dressing or bathing? N  Doing errands, shopping? N  Preparing Food and eating ? N  Using the Toilet? N  In the past six months, have you accidently leaked urine? Y  Comment wears pads  Do you have problems with loss of bowel control? N  Managing your Medications? N  Managing your Finances? N  Housekeeping or managing your Housekeeping? N  Some recent data might be hidden    Patient Care Team: Tower, Wynelle Fanny, MD as PCP - General Shon Hough, MD as Consulting Physician (Ophthalmology)  Indicate any recent Medical Services you may have received from other than [facility] providers in the past year (date may be approximate).     Assessment:   This is a routine wellness examination for  Peter Congo.  Hearing/Vision screen No exam data present  Dietary issues and exercise activities discussed: Current Exercise Habits: The patient does not participate in regular exercise at present, Exercise limited by: None identified  Goals Addressed            This Visit's Progress   . Patient Stated       02/22/2021, I will maintain and continue medications as prescribed.       Depression Screen PHQ 2/9 Scores 02/22/2021 02/17/2020 02/10/2019 01/29/2018 01/12/2017 06/11/2015 11/14/2013  PHQ - 2 Score 0 0 0 0 0 0 0  PHQ- 9 Score 0 0 - 0 - - -    Fall Risk Fall Risk  02/22/2021 02/20/2020 02/17/2020 01/29/2018 01/12/2017  Falls in the past year? 1 1 1  No No  Comment - - fell and broke leg - -  Number falls in past yr: 0 1 0 - -  Injury with Fall? 1 1 1  - -  Risk for fall due to : Medication side effect;History of fall(s) - Medication side effect - -  Follow up Falls evaluation completed;Falls prevention discussed - Falls evaluation completed;Falls prevention discussed - -    FALL RISK PREVENTION PERTAINING TO THE HOME:  Any stairs in or around the home? Yes  If so, are there any without handrails? No  Home free of loose throw rugs in walkways, pet beds, electrical cords, etc? Yes  Adequate  lighting in your home to reduce risk of falls? Yes   ASSISTIVE DEVICES UTILIZED TO PREVENT FALLS:  Life alert? No  Use of a cane, [place] or w/c? No  Grab bars in the bathroom? No  Shower chair or bench in shower? No  Elevated toilet seat or a handicapped toilet? No   TIMED UP AND GO:  Was the test performed? N/A telephone visit.    Cognitive Function: MMSE - Mini Mental State Exam 02/22/2021 02/17/2020 01/29/2018 01/12/2017  Orientation to time 5 5 5 5   Orientation to Place 5 5 5 5   Registration 3 3 3 3   Attention/ Calculation 5 5 0 0  Recall 3 3 3 2   Recall-comments - - - pt was unable to recall 1 of 3 words  Language- name 2 objects - - 0 0  Language- repeat 1 1 1 1   Language- follow 3 step command - - 3 3  Language- read & follow direction - - 0 0  Write a sentence - - 0 0  Copy design - - 0 0  Total score - - 20 19  Mini Cog  Mini-Cog screen was completed. Maximum score is 22. A value of 0 denotes this part of the MMSE was not completed or the patient failed this part of the Mini-Cog screening.       Immunizations Immunization History  Administered Date(s) Administered  . Influenza Split 09/20/2012  . Influenza, High Dose Seasonal PF 06/26/2018, 07/09/2019  . Influenza,inj,Quad PF,6+ Mos 06/11/2015  . Influenza-Unspecified 07/09/2016  . PFIZER(Purple Top)SARS-COV-2 Vaccination 10/31/2019, 11/22/2019, 08/09/2020  . Pneumococcal Conjugate-13 06/11/2015  . Pneumococcal Polysaccharide-23 11/14/2013  . Td 03/31/2002  . Tdap 01/27/2011  . Zoster 11/11/2011  . Zoster Recombinat (Shingrix) 07/23/2019    TDAP status: Due, Education has been provided regarding the importance of this vaccine. Advised may receive this vaccine at local pharmacy or Health Dept. Aware to provide a copy of the vaccination record if obtained from local pharmacy or Health Dept. Verbalized acceptance and understanding.  Flu Vaccine status: due Fall 2022  Pneumococcal vaccine status: Up to  date  Covid-19 vaccine status: Completed vaccines  Qualifies for Shingles Vaccine? Yes   Zostavax completed Yes   Shingrix Completed?: No.    Education has been provided regarding the importance of this vaccine. Patient has been advised to call insurance company to determine out of pocket expense if they have not yet received this vaccine. Advised may also receive vaccine at local pharmacy or Health Dept. Verbalized acceptance and understanding.  Screening Tests Health Maintenance  Topic Date Due  . Hepatitis C Screening  Never done  . TETANUS/TDAP  02/23/2024 (Originally 01/26/2021)  . MAMMOGRAM  04/08/2021  . INFLUENZA VACCINE  05/09/2021  . DEXA SCAN  Completed  . COVID-19 Vaccine  Completed  . PNA vac Low Risk Adult  Completed  . HPV VACCINES  Aged Out    Health Maintenance  Health Maintenance Due  Topic Date Due  . Hepatitis C Screening  Never done    Colorectal cancer screening: No longer required.   Mammogram status: Completed 04/2021. Repeat every year per patient   Bone Density status: Completed 04/20/2020. Results reflect: Bone density results: OSTEOPOROSIS. Repeat every 2 years.  Lung Cancer Screening: (Low Dose CT Chest recommended if Age 54-80 years, 30 pack-year currently smoking OR have quit w/in 15years.) does not qualify.   Additional Screening:  Hepatitis C Screening: does qualify; Completed due  Vision Screening: Recommended annual ophthalmology exams for early detection of glaucoma and other disorders of the eye. Is the patient up to date with their annual eye exam?  Yes  Who is the provider or what is the name of the office in which the patient attends annual eye exams? Dr. Satira Sark, [facility] If pt is not established with a provider, would they like to be referred to a provider to establish care? No .   Dental Screening: Recommended annual dental exams for proper oral hygiene  Community Resource Referral / Chronic Care Management: CRR  required this visit?  No   CCM required this visit?  No      Plan:     I have personally reviewed and noted the following in the patient's chart:   . Medical and social history . Use of alcohol, tobacco or illicit drugs  . Current medications and supplements including opioid prescriptions.  . Functional ability and status . Nutritional status . Physical activity . Advanced directives . List of other physicians . Hospitalizations, surgeries, and ER visits in previous 12 months . Vitals . Screenings to include cognitive, depression, and falls . Referrals and appointments  In addition, I have reviewed and discussed with patient certain preventive protocols, quality metrics, and best practice recommendations. A written personalized care plan for preventive services as well as general preventive health recommendations were provided to patient.   Due to this being a telephonic visit, the after visit summary with patients personalized plan was offered to patient via office or my-chart. Patient preferred to pick up at office at next visit or via mychart.   Andrez Grime, LPN   [id number]

## 2021-02-22 NOTE — Progress Notes (Signed)
PCP notes:  Health Maintenance: Tdap- insurance   Abnormal Screenings: none   Patient concerns: none   Nurse concerns: none   Next PCP appt.: none 

## 2021-03-02 ENCOUNTER — Other Ambulatory Visit: Payer: Self-pay

## 2021-03-02 ENCOUNTER — Ambulatory Visit (AMBULATORY_SURGERY_CENTER): Payer: Self-pay | Admitting: *Deleted

## 2021-03-02 VITALS — Ht 62.5 in | Wt 138.0 lb

## 2021-03-02 DIAGNOSIS — K259 Gastric ulcer, unspecified as acute or chronic, without hemorrhage or perforation: Secondary | ICD-10-CM

## 2021-03-02 NOTE — Progress Notes (Signed)
No egg or soy allergy known to patient  No issues with past sedation with any surgeries or procedures Patient denies ever being told they had issues or difficulty with intubation  No FH of Malignant Hyperthermia No diet pills per patient No home 02 use per patient  No blood thinners per patient  Pt denies issues with constipation  No A fib or A flutter  EMMI video to pt or via MyChart  COVID 19 guidelines implemented in [place] today with Pt and RN  Pt is fully vaccinated  for Covid and Pt had Covid 12-02-2020 with an infusion    Due to the COVID-19 pandemic we are asking patients to follow certain guidelines.  Pt aware of COVID protocols and [facility] guidelines

## 2021-03-17 ENCOUNTER — Other Ambulatory Visit: Payer: Self-pay | Admitting: Family Medicine

## 2021-03-17 NOTE — Telephone Encounter (Signed)
Please schedule f/u (or PE if she needs it) and refill until then

## 2021-03-17 NOTE — Telephone Encounter (Signed)
No recent or future appts., please advise  

## 2021-03-17 NOTE — Telephone Encounter (Signed)
BP med Lisinopril filled once and will route to [facility] to f/u regarding appt

## 2021-03-18 ENCOUNTER — Encounter: Payer: Self-pay | Admitting: Internal Medicine

## 2021-03-21 ENCOUNTER — Other Ambulatory Visit: Payer: Self-pay

## 2021-03-21 ENCOUNTER — Encounter: Payer: Self-pay | Admitting: Internal Medicine

## 2021-03-21 ENCOUNTER — Ambulatory Visit (AMBULATORY_SURGERY_CENTER): Payer: Medicare HMO | Admitting: Internal Medicine

## 2021-03-21 VITALS — BP 93/48 | HR 66 | Temp 97.1°F | Resp 12 | Ht 62.0 in | Wt 138.0 lb

## 2021-03-21 DIAGNOSIS — K219 Gastro-esophageal reflux disease without esophagitis: Secondary | ICD-10-CM | POA: Diagnosis not present

## 2021-03-21 DIAGNOSIS — K259 Gastric ulcer, unspecified as acute or chronic, without hemorrhage or perforation: Secondary | ICD-10-CM | POA: Diagnosis not present

## 2021-03-21 DIAGNOSIS — Z8719 Personal history of other diseases of the digestive system: Secondary | ICD-10-CM | POA: Diagnosis not present

## 2021-03-21 DIAGNOSIS — K449 Diaphragmatic hernia without obstruction or gangrene: Secondary | ICD-10-CM

## 2021-03-21 DIAGNOSIS — E785 Hyperlipidemia, unspecified: Secondary | ICD-10-CM | POA: Diagnosis not present

## 2021-03-21 DIAGNOSIS — I10 Essential (primary) hypertension: Secondary | ICD-10-CM | POA: Diagnosis not present

## 2021-03-21 MED ORDER — SODIUM CHLORIDE 0.9 % IV SOLN: 500.0000 mL | Freq: Once | INTRAVENOUS | Status: DC

## 2021-03-21 NOTE — Op Note (Signed)
[place] Patient Name: [name] Procedure Date: 03/21/2021 9:31 AM MRN: [id number] Endoscopist: Docia Chuck. Henrene Pastor , MD Age: 80 Referring MD:  Date of Birth: [birth year] Gender: Female Account #: [id number] Procedure:                Upper GI endoscopy Indications:              Follow-up of acute gastric ulcer. EGD January 06, 2021 Medicines:                Monitored Anesthesia Care Procedure:                Pre-Anesthesia Assessment:                           - Prior to the procedure, a History and Physical                            was performed, and patient medications and                            allergies were reviewed. The patient's tolerance of                            previous anesthesia was also reviewed. The risks                            and benefits of the procedure and the sedation                            options and risks were discussed with the patient.                            All questions were answered, and informed consent                            was obtained. Prior Anticoagulants: The patient has                            taken no previous anticoagulant or antiplatelet                            agents. After reviewing the risks and benefits, the                            patient was deemed in satisfactory condition to                            undergo the procedure.                           After obtaining informed consent, the endoscope was                            passed under direct vision. Throughout the  procedure, the patient's blood pressure, pulse, and                            oxygen saturations were monitored continuously. The                            Endoscope was introduced through the mouth, and                            advanced to the second part of duodenum. The upper                            GI endoscopy was accomplished without difficulty.                            The patient  tolerated the procedure well. Scope In: Scope Out: Findings:                 The esophagus was normal.                           The stomach revealed healing of the majority of the                            ulcers. 2 trivial erosions noted in the areas of                            previous ulceration. Small hiatal hernia.                           The examined duodenum was normal.                           The cardia and gastric fundus were normal on                            retroflexion. Complications:            No immediate complications. Estimated Blood Loss:     Estimated blood loss: none. Impression:               1. Essentially healed gastric ulcers. Minimal                            erosive change noted.                           Otherwise normal EGD. Recommendation:           - Patient has a contact number available for                            emergencies. The signs and symptoms of potential                            delayed complications were discussed with the  patient. Return to normal activities tomorrow.                            Written discharge instructions were provided to the                            patient.                           - Resume previous diet.                           - Continue present medications.                           -Continue to avoid NSAIDs such as Advil or ibuprofen                           -Continue pantoprazole. You should take this daily.                            You may reduce your dosage to 40 mg once daily.                            This will protect your stomach against recurrent                            ulcer formation Alichia Alridge N. Henrene Pastor, MD 03/21/2021 9:55:36 AM This report has been signed electronically.

## 2021-03-21 NOTE — Progress Notes (Signed)
Report to PACU, RN, vss, BBS= Clear.  

## 2021-03-21 NOTE — Patient Instructions (Signed)
YOU HAD AN ENDOSCOPIC PROCEDURE TODAY AT THE [facility]:   Refer to the procedure report that was given to you for any specific questions about what was found during the examination.  If the procedure report does not answer your questions, please call your gastroenterologist to clarify.  If you requested that your care partner not be given the details of your procedure findings, then the procedure report has been included in a sealed envelope for you to review at your convenience later.  YOU SHOULD EXPECT: Some feelings of bloating in the abdomen. Passage of more gas than usual.  Walking can help get rid of the air that was put into your GI tract during the procedure and reduce the bloating. If you had a lower endoscopy (such as a colonoscopy or flexible sigmoidoscopy) you may notice spotting of blood in your stool or on the toilet paper. If you underwent a bowel prep for your procedure, you may not have a normal bowel movement for a few days.  Please Note:  You might notice some irritation and congestion in your nose or some drainage.  This is from the oxygen used during your procedure.  There is no need for concern and it should clear up in a day or so.  SYMPTOMS TO REPORT IMMEDIATELY:   Following lower endoscopy (colonoscopy or flexible sigmoidoscopy):  Excessive amounts of blood in the stool  Significant tenderness or worsening of abdominal pains  Swelling of the abdomen that is new, acute  Fever of 100F or higher   Following upper endoscopy (EGD)  Vomiting of blood or coffee ground material  New chest pain or pain under the shoulder blades  Painful or persistently difficult swallowing  New shortness of breath  Fever of 100F or higher  Black, tarry-looking stools  For urgent or emergent issues, a gastroenterologist can be reached at any hour by calling [phone]. Do not use MyChart messaging for urgent concerns.    DIET:  We do recommend a small meal at first, but  then you may proceed to your regular diet.  Drink plenty of fluids but you should avoid alcoholic beverages for 24 hours.  ACTIVITY:  You should plan to take it easy for the rest of today and you should NOT DRIVE or use heavy machinery until tomorrow (because of the sedation medicines used during the test).    FOLLOW UP: Our staff will call the number listed on your records 48-72 hours following your procedure to check on you and address any questions or concerns that you may have regarding the information given to you following your procedure. If we do not reach you, we will leave a message.  We will attempt to reach you two times.  During this call, we will ask if you have developed any symptoms of COVID 19. If you develop any symptoms (ie: fever, flu-like symptoms, shortness of breath, cough etc.) before then, please call [phone].  If you test positive for Covid 19 in the 2 weeks post procedure, please call and report this information to us.    If any biopsies were taken you will be contacted by phone or by letter within the next 1-3 weeks.  Please call us at [phone] if you have not heard about the biopsies in 3 weeks.    SIGNATURES/CONFIDENTIALITY: You and/or your care partner have signed paperwork which will be entered into your electronic medical record.  These signatures attest to the fact that that the information above on   your After Visit Summary has been reviewed and is understood.  Full responsibility of the confidentiality of this discharge information lies with you and/or your care-partner. 

## 2021-03-21 NOTE — Progress Notes (Signed)
Pt's states no medical or surgical changes since previsit or office visit.   Mo Iv and AG vitals.

## 2021-03-23 ENCOUNTER — Telehealth: Payer: Self-pay

## 2021-03-23 ENCOUNTER — Telehealth: Payer: Self-pay | Admitting: *Deleted

## 2021-03-23 NOTE — Telephone Encounter (Signed)
  Follow up Call-  Call back number 03/21/2021 01/06/2021  Post procedure Call Back phone  # [phone]  Permission to leave phone message Yes Yes  Some recent data might be hidden     Patient questions:  Do you have a fever, pain , or abdominal swelling? No. Pain Score  0 *  Have you tolerated food without any problems? Yes.    Have you been able to return to your normal activities? Yes.    Do you have any questions about your discharge instructions: Diet   No. Medications  No. Follow up visit  No.  Do you have questions or concerns about your Care? No.  Actions: * If pain score is 4 or above: No action needed, pain <4.   Have you developed a fever since your procedure? No   2.   Have you had an respiratory symptoms (SOB or cough) since your procedure? No   3.   Have you tested positive for COVID 19 since your procedure no   4.   Have you had any family members/close contacts diagnosed with the COVID 19 since your procedure?  No    If yes to any of these questions please route to Joylene John, RN and Joella Prince, RN

## 2021-03-23 NOTE — Telephone Encounter (Signed)
Follow up call, without answer. VM left.

## 2021-03-23 NOTE — Telephone Encounter (Signed)
Patient is scheduled   

## 2021-04-13 ENCOUNTER — Other Ambulatory Visit: Payer: Self-pay | Admitting: Family Medicine

## 2021-04-27 ENCOUNTER — Other Ambulatory Visit: Payer: Self-pay

## 2021-04-27 ENCOUNTER — Encounter: Payer: Self-pay | Admitting: Family Medicine

## 2021-04-27 ENCOUNTER — Ambulatory Visit (INDEPENDENT_AMBULATORY_CARE_PROVIDER_SITE_OTHER): Payer: Medicare HMO | Admitting: Family Medicine

## 2021-04-27 VITALS — BP 128/76 | HR 68 | Temp 97.6°F | Ht 62.0 in | Wt 134.3 lb

## 2021-04-27 DIAGNOSIS — I10 Essential (primary) hypertension: Secondary | ICD-10-CM

## 2021-04-27 DIAGNOSIS — M8000XA Age-related osteoporosis with current pathological fracture, unspecified site, initial encounter for fracture: Secondary | ICD-10-CM | POA: Diagnosis not present

## 2021-04-27 DIAGNOSIS — R7303 Prediabetes: Secondary | ICD-10-CM

## 2021-04-27 DIAGNOSIS — E7800 Pure hypercholesterolemia, unspecified: Secondary | ICD-10-CM

## 2021-04-27 DIAGNOSIS — K259 Gastric ulcer, unspecified as acute or chronic, without hemorrhage or perforation: Secondary | ICD-10-CM | POA: Diagnosis not present

## 2021-04-27 LAB — CBC WITH DIFFERENTIAL/PLATELET
Basophils Absolute: 0 10*3/uL (ref 0.0–0.1)
Basophils Relative: 0.6 % (ref 0.0–3.0)
Eosinophils Absolute: 0.2 10*3/uL (ref 0.0–0.7)
Eosinophils Relative: 3.5 % (ref 0.0–5.0)
HCT: 37.8 % (ref 36.0–46.0)
Hemoglobin: 12.9 g/dL (ref 12.0–15.0)
Lymphocytes Relative: 17.5 % (ref 12.0–46.0)
Lymphs Abs: 1.2 10*3/uL (ref 0.7–4.0)
MCHC: 34.1 g/dL (ref 30.0–36.0)
MCV: 91.2 fl (ref 78.0–100.0)
Monocytes Absolute: 0.4 10*3/uL (ref 0.1–1.0)
Monocytes Relative: 5.9 % (ref 3.0–12.0)
Neutro Abs: 5.1 10*3/uL (ref 1.4–7.7)
Neutrophils Relative %: 72.5 % (ref 43.0–77.0)
Platelets: 219 10*3/uL (ref 150.0–400.0)
RBC: 4.14 Mil/uL (ref 3.87–5.11)
RDW: 13.1 % (ref 11.5–15.5)
WBC: 7.1 10*3/uL (ref 4.0–10.5)

## 2021-04-27 LAB — COMPREHENSIVE METABOLIC PANEL
ALT: 14 U/L (ref 0–35)
AST: 19 U/L (ref 0–37)
Albumin: 4.2 g/dL (ref 3.5–5.2)
Alkaline Phosphatase: 95 U/L (ref 39–117)
BUN: 17 mg/dL (ref 6–23)
CO2: 27 mEq/L (ref 19–32)
Calcium: 9.5 mg/dL (ref 8.4–10.5)
Chloride: 103 mEq/L (ref 96–112)
Creatinine, Ser: 1.15 mg/dL (ref 0.40–1.20)
GFR: 45.56 mL/min — ABNORMAL LOW (ref >60.00)
Glucose, Bld: 110 mg/dL — ABNORMAL HIGH (ref 70–99)
Potassium: 4.8 mEq/L (ref 3.5–5.1)
Sodium: 140 mEq/L (ref 135–145)
Total Bilirubin: 0.6 mg/dL (ref 0.2–1.2)
Total Protein: 6.5 g/dL (ref 6.0–8.3)

## 2021-04-27 LAB — LIPID PANEL
Cholesterol: 171 mg/dL (ref 0–200)
HDL: 47.9 mg/dL (ref >39.00)
LDL Cholesterol: 104 mg/dL — ABNORMAL HIGH (ref 0–99)
NonHDL: 122.89
Total CHOL/HDL Ratio: 4
Triglycerides: 94 mg/dL (ref 0.0–149.0)
VLDL: 18.8 mg/dL (ref 0.0–40.0)

## 2021-04-27 LAB — TSH: TSH: 2.58 u[IU]/mL (ref 0.35–5.50)

## 2021-04-27 LAB — VITAMIN D 25 HYDROXY (VIT D DEFICIENCY, FRACTURES): VITD: 100.5 ng/mL — ABNORMAL HIGH (ref 30.00–100.00)

## 2021-04-27 LAB — HEMOGLOBIN A1C: Hgb A1c MFr Bld: 6.2 % (ref 4.6–6.5)

## 2021-04-27 MED ORDER — ATORVASTATIN CALCIUM 10 MG PO TABS: 10.0000 mg | ORAL_TABLET | Freq: Every day | ORAL | 3 refills | Status: DC

## 2021-04-27 MED ORDER — LISINOPRIL 5 MG PO TABS: 5.0000 mg | ORAL_TABLET | Freq: Every day | ORAL | 3 refills | Status: DC

## 2021-04-27 NOTE — Progress Notes (Signed)
Subjective:    Patient ID: [name], female    DOB: Apr 18, 1941, 80 y.o.   MRN: [id number]  This visit occurred during the SARS-CoV-2 public health emergency.  Safety protocols were in place, including screening questions prior to the visit, additional usage of staff PPE, and extensive cleaning of exam room while observing appropriate contact time as indicated for disinfecting solutions.   HPI Pt presents for f/u of chronic health problems  Wt Readings from Last 3 Encounters:  04/27/21 134 lb 5 oz (60.9 kg)  03/21/21 138 lb (62.6 kg)  03/02/21 138 lb (62.6 kg)   24.57 kg/m  Eating healthy  Not enough exercise- had a broken leg for 8 months last year  Active Walks a lot   Staying busy Feeling pretty good   Has esophagitis with ulcers - last egd June (healing)  Improved with protonix  No bleeding  Has a little sore throat from it No h pylori  Does nto run in family  No pain or heartburn  No nsaids  Stopped caffeine and carbonation and citrus   Is very careful     HTN bp is stable today  No cp or palpitations or headaches or edema  No side effects to medicines  BP Readings from Last 3 Encounters:  04/27/21 128/76  03/21/21 (!) 93/48  01/06/21 (!) 101/59    Lisinopril 5 mg daily   Lab Results  Component Value Date   CREATININE 0.95 12/20/2020   BUN 16 12/20/2020   NA 139 12/20/2020   K 4.6 12/20/2020   CL 103 12/20/2020   CO2 29 12/20/2020   OP No medication (disc alendronate-no longer an option due to the ulcers)   Hyperlipidemia Lab Results  Component Value Date   CHOL 172 02/17/2020   HDL 41.90 02/17/2020   LDLCALC 97 02/17/2020   TRIG 170.0 (H) 02/17/2020   CHOLHDL 4 02/17/2020  Atorvastatin 10 mg daily    Prediabetes Lab Results  Component Value Date   HGBA1C 6.0 02/17/2020    Patient Active Problem List   Diagnosis Date Noted   Hematoma of left breast 01/02/2020   Hyperkalemia 02/01/2018   Screening mammogram, encounter for  01/19/2017   Estrogen deficiency 01/19/2017   Routine general medical examination at a health care facility 06/11/2015   Encounter for Medicare annual wellness exam 11/14/2013   Dermatitis 11/14/2013   Family history of colon cancer 11/14/2013   Colon cancer screening 11/14/2013   Hypertension 01/27/2011   Osteoporosis 01/27/2011   Post-menopausal 01/27/2011   Low back pain radiating to left leg 03/13/2008   Hyperlipidemia 12/20/2007   CONSTIPATION, CHRONIC 12/20/2007   Prediabetes 12/20/2007   Past Medical History:  Diagnosis Date   Back pain    Chronic constipation    past hx- no current constipation 02-2021   Hyperlipidemia    Hypertension    Multiple gastric ulcers    Osteopenia    Single kidney    donated kidney    Past Surgical History:  Procedure Laterality Date   CLEFT PALATE REPAIR     COLONOSCOPY     FOOT NEUROMA SURGERY  2/06   NEPHRECTOMY LIVING DONOR     Left   TUBAL LIGATION     UPPER GASTROINTESTINAL ENDOSCOPY     Social History   Tobacco Use   Smoking status: Never   Smokeless tobacco: Never  Vaping Use   Vaping Use: Never used  Substance Use Topics   Alcohol use: No  Drug use: No   Family History  Problem Relation Age of Onset   Colon cancer Mother    Diabetes Mother    Coronary artery disease Mother    Osteoporosis Mother    Dementia Mother    Coronary artery disease Father    Heart failure Father    Hypertension Father    Stomach cancer Neg Hx    Pancreatic cancer Neg Hx    Esophageal cancer Neg Hx    Liver disease Neg Hx    Colon polyps Neg Hx    Rectal cancer Neg Hx    Allergies  Allergen Reactions   Codeine    Hydrocodone Bit-Homatrop Mbr     Caused flu-like symptoms   Current Outpatient Medications on File Prior to Visit  Medication Sig Dispense Refill   aspirin 81 MG tablet Take 81 mg by mouth daily.     Cholecalciferol (EQL VITAMIN D3) 125 MCG (5000 UT) capsule Take 5,000 Units by mouth daily.     pantoprazole  (PROTONIX) 40 MG tablet Take 1 tablet (40 mg total) by mouth 2 (two) times daily. 60 tablet 11   No current facility-administered medications on file prior to visit.     Review of Systems  Constitutional:  Negative for activity change, appetite change, fatigue, fever and unexpected weight change.  HENT:  Negative for congestion, ear pain, rhinorrhea, sinus pressure and sore throat.   Eyes:  Negative for pain, redness and visual disturbance.  Respiratory:  Negative for cough, shortness of breath and wheezing.   Cardiovascular:  Negative for chest pain and palpitations.  Gastrointestinal:  Negative for abdominal distention, abdominal pain, anal bleeding, blood in stool, constipation and diarrhea.       Has to watch out for acid in diet  Endocrine: Negative for polydipsia and polyuria.  Genitourinary:  Negative for dysuria, frequency and urgency.  Musculoskeletal:  Negative for arthralgias, back pain and myalgias.  Skin:  Negative for pallor and rash.  Allergic/Immunologic: Negative for environmental allergies.  Neurological:  Negative for dizziness, syncope and headaches.  Hematological:  Negative for adenopathy. Does not bruise/bleed easily.  Psychiatric/Behavioral:  Negative for decreased concentration and dysphoric mood. The patient is not nervous/anxious.       Objective:   Physical Exam Constitutional:      General: She is not in acute distress.    Appearance: Normal appearance. She is well-developed and normal weight. She is not ill-appearing or diaphoretic.  HENT:     Head: Normocephalic and atraumatic.  Eyes:     Conjunctiva/sclera: Conjunctivae normal.     Pupils: Pupils are equal, round, and reactive to light.  Neck:     Thyroid: No thyromegaly.     Vascular: No carotid bruit or JVD.  Cardiovascular:     Rate and Rhythm: Normal rate and regular rhythm.     Heart sounds: Normal heart sounds.    No gallop.  Pulmonary:     Effort: Pulmonary effort is normal. No  respiratory distress.     Breath sounds: Normal breath sounds. No wheezing or rales.  Abdominal:     General: Bowel sounds are normal. There is no distension or abdominal bruit.     Palpations: Abdomen is soft. There is no mass.     Tenderness: There is no abdominal tenderness.  Musculoskeletal:     Cervical back: Normal range of motion and neck supple.     Right lower leg: No edema.     Left lower leg: No edema.  Lymphadenopathy:     Cervical: No cervical adenopathy.  Skin:    General: Skin is warm and dry.     Coloration: Skin is not pale.     Findings: No rash.  Neurological:     Mental Status: She is alert.     Coordination: Coordination normal.     Deep Tendon Reflexes: Reflexes are normal and symmetric. Reflexes normal.  Psychiatric:        Mood and Affect: Mood normal.          Assessment & Plan:   Problem List Items Addressed This Visit       Cardiovascular and Mediastinum   Hypertension - Primary    bp in fair control at this time  BP Readings from Last 1 Encounters:  04/27/21 128/76  No changes needed Most recent labs reviewed  Disc lifstyle change with low sodium diet and exercise  Plan to continue lisinopril 5 mg daily  Labs today      Relevant Medications   atorvastatin (LIPITOR) 10 MG tablet   lisinopril (ZESTRIL) 5 MG tablet   Other Relevant Orders   CBC with Differential/Platelet   Comprehensive metabolic panel   Lipid panel   TSH     Musculoskeletal and Integument   Osteoporosis    No medication  No longer a candidate for alendronate due to esophagitis  Disc imp of supplements and exercise        Relevant Orders   VITAMIN D 25 Hydroxy (Vit-D Deficiency, Fractures)     Other   Hyperlipidemia    Disc goals for lipids and reasons to control them Rev last labs with pt Rev low sat fat diet in detail Plan to continue atorvastatin 10 mg daily  Labs today       Relevant Medications   atorvastatin (LIPITOR) 10 MG tablet   lisinopril  (ZESTRIL) 5 MG tablet   Other Relevant Orders   Lipid panel   Prediabetes    a1c today  Disc imp of low glycemic diet and exercise        Relevant Orders   Hemoglobin A1c

## 2021-04-27 NOTE — Assessment & Plan Note (Signed)
No medication  No longer a candidate for alendronate due to esophagitis  Disc imp of supplements and exercise

## 2021-04-27 NOTE — Assessment & Plan Note (Signed)
bp in fair control at this time  BP Readings from Last 1 Encounters:  04/27/21 128/76   No changes needed Most recent labs reviewed  Disc lifstyle change with low sodium diet and exercise  Plan to continue lisinopril 5 mg daily  Labs today

## 2021-04-27 NOTE — Assessment & Plan Note (Signed)
a1c today  Disc imp of low glycemic diet and exercise

## 2021-04-27 NOTE — Patient Instructions (Addendum)
Do go ahead and schedule your mammogram  No change in medicines   Labs today   Take care of yourself

## 2021-04-27 NOTE — Assessment & Plan Note (Signed)
Disc goals for lipids and reasons to control them Rev last labs with pt Rev low sat fat diet in detail Plan to continue atorvastatin 10 mg daily  Labs today

## 2021-04-28 ENCOUNTER — Telehealth: Payer: Self-pay

## 2021-04-28 MED ORDER — ONDANSETRON 8 MG PO TBDP: 8.0000 mg | ORAL_TABLET | Freq: Three times a day (TID) | ORAL | 0 refills | Status: DC | PRN

## 2021-04-28 NOTE — Telephone Encounter (Signed)
I sent zofran to her pharmacy -caution of sedation.  Try to sip fluids when she can tolerate them If no imp or worse-ER  Any virtual availability then next few days? [place] or Dr Maudie Mercury?  One covid test does not rule it out Low threshold to send to UC given age   I am out of the office for the rest of the day

## 2021-04-28 NOTE — Telephone Encounter (Signed)
Called the patient and informed her that Dr. Glori Bickers sent in her Rx to the pharmacy and she could go pick it up. Patient informed me that her husband already went and picked it up and she said she would go to the ER if she didn't improve. Patient stated the appreciation for the call.

## 2021-04-28 NOTE — Telephone Encounter (Signed)
Pt said was seen in office on 04/27/21 for CPX and pt now has prod cough with yellow to clear phlegm, temp this morning 99.4 pt has N&V that started this morning pt cannot keep anything down; pt just took swallow of gingerale and it came right back up. H/A with pain level 5. Pt does not have SOB or diarrhea or S/t. Pt does have runny nose. Pt request med for N&V, pts daughter recommended to ask for zofran to [facility] [place]. Pt said did home covid test this morning and was neg. Pt request cb after reviewed by Dr Glori Bickers.

## 2021-04-29 ENCOUNTER — Encounter: Payer: Self-pay | Admitting: *Deleted

## 2021-04-29 ENCOUNTER — Telehealth: Payer: Self-pay | Admitting: *Deleted

## 2021-04-29 NOTE — Telephone Encounter (Signed)
Left VM requesting pt to call the office back 

## 2021-04-29 NOTE — Telephone Encounter (Signed)
-----   Message from Abner Greenspan, MD sent at 04/27/2021  8:43 PM EDT ----- Kidney numbers are not as good as last time -I want to watch this  Increase water intake if possible for kidney health  Cholesterol is stable Vit D level is high- need to cut dose, how much is she taking? A1C up st to 6.2   try to limit sugar and refined carbs in diet (Try to get most of your carbohydrates from produce (with the exception of white potatoes)  Eat less bread/pasta/rice/snack foods/cereals/sweets and other items from the middle of the grocery store (processed carbs)

## 2021-05-02 DIAGNOSIS — H2513 Age-related nuclear cataract, bilateral: Secondary | ICD-10-CM | POA: Diagnosis not present

## 2021-05-02 DIAGNOSIS — H35371 Puckering of macula, right eye: Secondary | ICD-10-CM | POA: Diagnosis not present

## 2021-05-02 DIAGNOSIS — H524 Presbyopia: Secondary | ICD-10-CM | POA: Diagnosis not present

## 2021-05-02 DIAGNOSIS — H04123 Dry eye syndrome of bilateral lacrimal glands: Secondary | ICD-10-CM | POA: Diagnosis not present

## 2021-08-09 ENCOUNTER — Telehealth: Payer: Self-pay | Admitting: Family Medicine

## 2021-08-09 DIAGNOSIS — M8000XA Age-related osteoporosis with current pathological fracture, unspecified site, initial encounter for fracture: Secondary | ICD-10-CM

## 2021-08-09 DIAGNOSIS — I10 Essential (primary) hypertension: Secondary | ICD-10-CM

## 2021-08-09 DIAGNOSIS — E7800 Pure hypercholesterolemia, unspecified: Secondary | ICD-10-CM

## 2021-08-09 DIAGNOSIS — R7303 Prediabetes: Secondary | ICD-10-CM

## 2021-08-09 NOTE — Telephone Encounter (Signed)
-----   Message from Ellamae Sia sent at 07/27/2021  2:55 PM EDT ----- Regarding: Lab orders for Wednesday, 11.2.22  AWV lab orders, please.

## 2021-08-10 ENCOUNTER — Other Ambulatory Visit: Payer: Medicare HMO

## 2021-08-10 ENCOUNTER — Other Ambulatory Visit: Payer: Self-pay

## 2021-08-10 ENCOUNTER — Other Ambulatory Visit (INDEPENDENT_AMBULATORY_CARE_PROVIDER_SITE_OTHER): Payer: Medicare HMO

## 2021-08-10 DIAGNOSIS — E7800 Pure hypercholesterolemia, unspecified: Secondary | ICD-10-CM

## 2021-08-10 DIAGNOSIS — R7303 Prediabetes: Secondary | ICD-10-CM | POA: Diagnosis not present

## 2021-08-10 DIAGNOSIS — I10 Essential (primary) hypertension: Secondary | ICD-10-CM

## 2021-08-10 DIAGNOSIS — M8000XA Age-related osteoporosis with current pathological fracture, unspecified site, initial encounter for fracture: Secondary | ICD-10-CM | POA: Diagnosis not present

## 2021-08-11 LAB — CBC WITH DIFFERENTIAL/PLATELET
Basophils Absolute: 0.1 10*3/uL (ref 0.0–0.1)
Basophils Relative: 1.1 % (ref 0.0–3.0)
Eosinophils Absolute: 0.2 10*3/uL (ref 0.0–0.7)
Eosinophils Relative: 2.9 % (ref 0.0–5.0)
HCT: 37.3 % (ref 36.0–46.0)
Hemoglobin: 12.3 g/dL (ref 12.0–15.0)
Lymphocytes Relative: 24.8 % (ref 12.0–46.0)
Lymphs Abs: 1.6 10*3/uL (ref 0.7–4.0)
MCHC: 32.9 g/dL (ref 30.0–36.0)
MCV: 93.1 fl (ref 78.0–100.0)
Monocytes Absolute: 0.4 10*3/uL (ref 0.1–1.0)
Monocytes Relative: 6.7 % (ref 3.0–12.0)
Neutro Abs: 4.2 10*3/uL (ref 1.4–7.7)
Neutrophils Relative %: 64.5 % (ref 43.0–77.0)
Platelets: 226 10*3/uL (ref 150.0–400.0)
RBC: 4.01 Mil/uL (ref 3.87–5.11)
RDW: 13.4 % (ref 11.5–15.5)
WBC: 6.6 10*3/uL (ref 4.0–10.5)

## 2021-08-11 LAB — COMPREHENSIVE METABOLIC PANEL
ALT: 13 U/L (ref 0–35)
AST: 19 U/L (ref 0–37)
Albumin: 4.2 g/dL (ref 3.5–5.2)
Alkaline Phosphatase: 87 U/L (ref 39–117)
BUN: 25 mg/dL — ABNORMAL HIGH (ref 6–23)
CO2: 29 mEq/L (ref 19–32)
Calcium: 9.3 mg/dL (ref 8.4–10.5)
Chloride: 103 mEq/L (ref 96–112)
Creatinine, Ser: 1.24 mg/dL — ABNORMAL HIGH (ref 0.40–1.20)
GFR: 41.54 mL/min — ABNORMAL LOW (ref >60.00)
Glucose, Bld: 90 mg/dL (ref 70–99)
Potassium: 4.6 mEq/L (ref 3.5–5.1)
Sodium: 138 mEq/L (ref 135–145)
Total Bilirubin: 0.3 mg/dL (ref 0.2–1.2)
Total Protein: 6.8 g/dL (ref 6.0–8.3)

## 2021-08-11 LAB — LIPID PANEL
Cholesterol: 161 mg/dL (ref 0–200)
HDL: 43.5 mg/dL (ref >39.00)
LDL Cholesterol: 81 mg/dL (ref 0–99)
NonHDL: 117.47
Total CHOL/HDL Ratio: 4
Triglycerides: 182 mg/dL — ABNORMAL HIGH (ref 0.0–149.0)
VLDL: 36.4 mg/dL (ref 0.0–40.0)

## 2021-08-11 LAB — VITAMIN D 25 HYDROXY (VIT D DEFICIENCY, FRACTURES): VITD: 78.15 ng/mL (ref 30.00–100.00)

## 2021-08-11 LAB — TSH: TSH: 3.28 u[IU]/mL (ref 0.35–5.50)

## 2021-08-11 LAB — HEMOGLOBIN A1C: Hgb A1c MFr Bld: 5.9 % (ref 4.6–6.5)

## 2021-10-27 DIAGNOSIS — L570 Actinic keratosis: Secondary | ICD-10-CM | POA: Diagnosis not present

## 2021-10-27 DIAGNOSIS — D2272 Melanocytic nevi of left lower limb, including hip: Secondary | ICD-10-CM | POA: Diagnosis not present

## 2021-10-27 DIAGNOSIS — D1801 Hemangioma of skin and subcutaneous tissue: Secondary | ICD-10-CM | POA: Diagnosis not present

## 2021-10-27 DIAGNOSIS — L814 Other melanin hyperpigmentation: Secondary | ICD-10-CM | POA: Diagnosis not present

## 2021-10-27 DIAGNOSIS — L918 Other hypertrophic disorders of the skin: Secondary | ICD-10-CM | POA: Diagnosis not present

## 2021-10-27 DIAGNOSIS — L821 Other seborrheic keratosis: Secondary | ICD-10-CM | POA: Diagnosis not present

## 2021-12-07 DIAGNOSIS — Z833 Family history of diabetes mellitus: Secondary | ICD-10-CM | POA: Diagnosis not present

## 2021-12-07 DIAGNOSIS — E785 Hyperlipidemia, unspecified: Secondary | ICD-10-CM | POA: Diagnosis not present

## 2021-12-07 DIAGNOSIS — Z809 Family history of malignant neoplasm, unspecified: Secondary | ICD-10-CM | POA: Diagnosis not present

## 2021-12-07 DIAGNOSIS — Z87891 Personal history of nicotine dependence: Secondary | ICD-10-CM | POA: Diagnosis not present

## 2021-12-07 DIAGNOSIS — K219 Gastro-esophageal reflux disease without esophagitis: Secondary | ICD-10-CM | POA: Diagnosis not present

## 2021-12-07 DIAGNOSIS — Z7982 Long term (current) use of aspirin: Secondary | ICD-10-CM | POA: Diagnosis not present

## 2021-12-07 DIAGNOSIS — R32 Unspecified urinary incontinence: Secondary | ICD-10-CM | POA: Diagnosis not present

## 2021-12-07 DIAGNOSIS — I10 Essential (primary) hypertension: Secondary | ICD-10-CM | POA: Diagnosis not present

## 2021-12-07 DIAGNOSIS — Z8249 Family history of ischemic heart disease and other diseases of the circulatory system: Secondary | ICD-10-CM | POA: Diagnosis not present

## 2021-12-07 DIAGNOSIS — Z823 Family history of stroke: Secondary | ICD-10-CM | POA: Diagnosis not present

## 2022-01-20 ENCOUNTER — Other Ambulatory Visit: Payer: Self-pay | Admitting: Internal Medicine

## 2022-02-23 ENCOUNTER — Ambulatory Visit: Payer: Medicare HMO

## 2022-02-28 ENCOUNTER — Ambulatory Visit (INDEPENDENT_AMBULATORY_CARE_PROVIDER_SITE_OTHER): Payer: Medicare HMO

## 2022-02-28 VITALS — Wt 134.0 lb

## 2022-02-28 DIAGNOSIS — Z0000 Encounter for general adult medical examination without abnormal findings: Secondary | ICD-10-CM | POA: Diagnosis not present

## 2022-02-28 NOTE — Progress Notes (Signed)
Subjective:   [name] is a 81 y.o. female who presents for Medicare Annual (Subsequent) preventive examination.  Virtual Visit via Telephone Note  I connected with  [name] on 02/28/22 at  4:00 PM EDT by telephone and verified that I am speaking with the correct person using two identifiers.  Location: Patient: Home Provider: [facility] Persons participating in the virtual visit: patient/Nurse Health Advisor   I discussed the limitations, risks, security and privacy concerns of performing an evaluation and management service by telephone and the availability of in person appointments. The patient expressed understanding and agreed to proceed.  Interactive audio and video telecommunications were attempted between this nurse and patient, however failed, due to patient having technical difficulties OR patient did not have access to video capability.  We continued and completed visit with audio only.  Some vital signs may be absent or patient reported.   Treyden Hakim E Elorah Dewing, LPN   Review of Systems     Cardiac Risk Factors include: advanced age (>60mn, >>21women);sedentary lifestyle;dyslipidemia;hypertension     Objective:    Today's Vitals   02/28/22 1605  Weight: 134 lb (60.8 kg)   Body mass index is 24.51 kg/m.     02/28/2022    4:26 PM 02/22/2021    3:30 PM 12/02/2020   10:24 AM 02/17/2020   10:35 AM 01/29/2018    9:16 AM 01/12/2017    8:43 AM  Advanced Directives  Does Patient Have a Medical Advance Directive? Yes Yes Yes Yes Yes Yes  Type of AParamedicof [place]Living will HE[place]Living will H[place]elineLiving will HFort AshbyLiving will HHagermanLiving will HParkers PrairieLiving will  Copy of HRio Osoin Chart? No - copy requested No - copy requested  No - copy requested No - copy requested No - copy requested    Current  Medications (verified) Outpatient Encounter Medications as of 02/28/2022  Medication Sig   aspirin 81 MG tablet Take 81 mg by mouth daily.   atorvastatin (LIPITOR) 10 MG tablet Take 1 tablet (10 mg total) by mouth daily.   Cholecalciferol (EQL VITAMIN D3) 125 MCG (5000 UT) capsule Take 5,000 Units by mouth daily.   D 1000 25 MCG (1000 UT) capsule SMARTSIG:1 By Mouth   desonide (DESOWEN) 0.05 % ointment Apply 1 application. topically 2 (two) times daily.   lisinopril (ZESTRIL) 5 MG tablet Take 1 tablet (5 mg total) by mouth daily.   Misc Natural Products (RA GLUCOSAMINE-CHONDROIT-MSM-D) TABS    pantoprazole (PROTONIX) 40 MG tablet TAKE 1 TABLET BY MOUTH TWICE A DAY   [DISCONTINUED] lisinopril (ZESTRIL) 10 MG tablet Take by mouth. (Patient not taking: Reported on 02/28/2022)   [DISCONTINUED] ondansetron (ZOFRAN ODT) 8 MG disintegrating tablet Take 1 tablet (8 mg total) by mouth every 8 (eight) hours as needed for nausea or vomiting. (Patient not taking: Reported on 02/28/2022)   No facility-administered encounter medications on file as of 02/28/2022.    Allergies (verified) Codeine and Hydrocodone bit-homatrop mbr   History: Past Medical History:  Diagnosis Date   Back pain    Chronic constipation    past hx- no current constipation 02-2021   Hyperlipidemia    Hypertension    Multiple gastric ulcers    Osteopenia    Single kidney    donated kidney    Past Surgical History:  Procedure Laterality Date   CLEFT PALATE REPAIR  COLONOSCOPY     FOOT NEUROMA SURGERY  2/06   NEPHRECTOMY LIVING DONOR     Left   TUBAL LIGATION     UPPER GASTROINTESTINAL ENDOSCOPY     Family History  Problem Relation Age of Onset   Colon cancer Mother    Diabetes Mother    Coronary artery disease Mother    Osteoporosis Mother    Dementia Mother    Coronary artery disease Father    Heart failure Father    Hypertension Father    Stomach cancer Neg Hx    Pancreatic cancer Neg Hx    Esophageal  cancer Neg Hx    Liver disease Neg Hx    Colon polyps Neg Hx    Rectal cancer Neg Hx    Social History   Socioeconomic History   Marital status: Married    Spouse name: Eddie Dibbles   Number of children: Not on file   Years of education: Not on file   Highest education level: Not on file  Occupational History   Occupation: works at a church  Tobacco Use   Smoking status: Never   Smokeless tobacco: Never  Vaping Use   Vaping Use: Never used  Substance and Sexual Activity   Alcohol use: No   Drug use: No   Sexual activity: Not Currently  Other Topics Concern   Not on file  Social History Narrative   Not on file   Social Determinants of Health   Financial Resource Strain: Low Risk    Difficulty of Paying Living Expenses: Not hard at all  Food Insecurity: No Food Insecurity   Worried About Charity fundraiser in the Last Year: Never true   [place] in the Last Year: Never true  Transportation Needs: No Transportation Needs   Lack of Transportation (Medical): No   Lack of Transportation (Non-Medical): No  Physical Activity: Insufficiently Active   Days of Exercise per Week: 7 days   Minutes of Exercise per Session: 20 min  Stress: No Stress Concern Present   Feeling of Stress : Not at all  Social Connections: Socially Integrated   Frequency of Communication with Friends and Family: More than three times a week   Frequency of Social Gatherings with Friends and Family: More than three times a week   Attends Religious Services: More than 4 times per year   Active Member of [employer] or Organizations: Yes   Attends Music therapist: More than 4 times per year   Marital Status: Married    Tobacco Counseling Counseling given: Not Answered   Clinical Intake:  Pre-visit preparation completed: Yes  Pain : No/denies pain     BMI - recorded: 24.51 Nutritional Status: BMI of 19-24  Normal Nutritional Risks: None Diabetes: No  How often do you need to have  someone help you when you read instructions, pamphlets, or other written materials from your doctor or pharmacy?: 1 - Never  Diabetic? no  Interpreter Needed?: No  Information entered by :: Catelyn Friel, LPN   Activities of Daily Living    02/28/2022    4:24 PM  In your present state of health, do you have any difficulty performing the following activities:  Hearing? 1  Comment mild  Vision? 0  Difficulty concentrating or making decisions? 0  Walking or climbing stairs? 0  Dressing or bathing? 0  Doing errands, shopping? 0  Preparing Food and eating ? N  Using the Toilet? N  In the  past six months, have you accidently leaked urine? Y  Do you have problems with loss of bowel control? N  Managing your Medications? N  Managing your Finances? N  Housekeeping or managing your Housekeeping? N    Patient Care Team: Tower, Wynelle Fanny, MD as PCP - Aris Georgia, MD as Consulting Physician (Ophthalmology)  Indicate any recent Medical Services you may have received from other than [facility] providers in the past year (date may be approximate).     Assessment:   This is a routine wellness examination for Julian.  Hearing/Vision screen Hearing Screening - Comments:: C/o  mild hearing difficulties   Vision Screening - Comments:: Wears rx glasses - up to date with routine eye exams with [facility] at [facility]  Dietary issues and exercise activities discussed: Current Exercise Habits: Home exercise routine, Type of exercise: walking, Time (Minutes): 20, Frequency (Times/Week): 7, Weekly Exercise (Minutes/Week): 140, Intensity: Mild, Exercise limited by: None identified   Goals Addressed             This Visit's Progress    Increase physical activity       02/28/2022, I will attempt to walk at least 1 mile daily.        Depression Screen    02/28/2022    4:10 PM 02/22/2021    3:32 PM 02/17/2020   10:36 AM 02/10/2019    2:36 PM 01/29/2018    9:15 AM 01/12/2017     8:43 AM 06/11/2015    8:15 AM  PHQ 2/9 Scores  PHQ - 2 Score 0 0 0 0 0 0 0  PHQ- 9 Score  0 0  0      Fall Risk    02/28/2022    4:07 PM 02/22/2021    3:31 PM 02/20/2020    8:32 AM 02/17/2020   10:36 AM 01/29/2018    9:15 AM  Fall Risk   Falls in the past year? '1 1 1 1 '$ No  Comment    fell and broke leg   Number falls in past yr: 0 0 1 0   Injury with Fall? '1 1 1 1   '$ Risk for fall due to : History of fall(s);Orthopedic patient Medication side effect;History of fall(s)  Medication side effect   Follow up Education provided;Falls prevention discussed Falls evaluation completed;Falls prevention discussed  Falls evaluation completed;Falls prevention discussed     FALL RISK PREVENTION PERTAINING TO THE HOME:  Any stairs in or around the home? Yes  If so, are there any without handrails? No  Home free of loose throw rugs in walkways, pet beds, electrical cords, etc? Yes  Adequate lighting in your home to reduce risk of falls? Yes   ASSISTIVE DEVICES UTILIZED TO PREVENT FALLS:  Life alert? No  Use of a cane, David or w/c? No  Grab bars in the bathroom? Yes  Shower chair or bench in shower? No  Elevated toilet seat or a handicapped toilet? No   TIMED UP AND GO:  Was the test performed? No . Telephonic visit  Cognitive Function:    02/22/2021    3:38 PM 02/17/2020   10:41 AM 01/29/2018    9:16 AM 01/12/2017    8:44 AM  MMSE - Mini Mental State Exam  Orientation to time '5 5 5 5  '$ Orientation to Place '5 5 5 5  '$ Registration '3 3 3 3  '$ Attention/ Calculation 5 5 0 0  Recall '3 3 3 2  '$ Recall-comments  pt was unable to recall 1 of 3 words  Language- name 2 objects   0 0  Language- repeat '1 1 1 1  '$ Language- follow 3 step command   3 3  Language- read & follow direction   0 0  Write a sentence   0 0  Copy design   0 0  Total score   20 19        02/28/2022    4:11 PM  6CIT Screen  What Year? 0 points  What month? 0 points  What time? 0 points  Count back from 20 0 points   Months in reverse 0 points  Repeat phrase 0 points  Total Score 0 points    Immunizations Immunization History  Administered Date(s) Administered   Influenza Split 09/20/2012   Influenza, High Dose Seasonal PF 06/26/2018, 07/09/2019   Influenza,inj,Quad PF,6+ Mos 06/11/2015   Influenza-Unspecified 07/09/2016   PFIZER(Purple Top)SARS-COV-2 Vaccination 10/31/2019, 11/22/2019, 08/09/2020   Pneumococcal Conjugate-13 06/11/2015   Pneumococcal Polysaccharide-23 11/14/2013   Td 03/31/2002   Tdap 01/27/2011   Zoster Recombinat (Shingrix) 07/23/2019   Zoster, Live 11/11/2011    TDAP status: Due, Education has been provided regarding the importance of this vaccine. Advised may receive this vaccine at local pharmacy or Health Dept. Aware to provide a copy of the vaccination record if obtained from local pharmacy or Health Dept. Verbalized acceptance and understanding.  Flu Vaccine status: Due, Education has been provided regarding the importance of this vaccine. Advised may receive this vaccine at local pharmacy or Health Dept. Aware to provide a copy of the vaccination record if obtained from local pharmacy or Health Dept. Verbalized acceptance and understanding.  Pneumococcal vaccine status: Up to date  Covid-19 vaccine status: Completed vaccines  Qualifies for Shingles Vaccine? Yes   Zostavax completed Yes   Shingrix Completed?: No.    Education has been provided regarding the importance of this vaccine. Patient has been advised to call insurance company to determine out of pocket expense if they have not yet received this vaccine. Advised may also receive vaccine at local pharmacy or Health Dept. Verbalized acceptance and understanding.  Screening Tests Health Maintenance  Topic Date Due   Hepatitis C Screening  Never done   Zoster Vaccines- Shingrix (2 of 2) 09/17/2019   COVID-19 Vaccine (4 - Booster for Pfizer series) 10/04/2020   MAMMOGRAM  04/08/2021   TETANUS/TDAP  02/23/2024  (Originally 01/26/2021)   INFLUENZA VACCINE  05/09/2022   Pneumonia Vaccine 19+ Years old  Completed   DEXA SCAN  Completed   HPV VACCINES  Aged Out    Health Maintenance  Health Maintenance Due  Topic Date Due   Hepatitis C Screening  Never done   Zoster Vaccines- Shingrix (2 of 2) 09/17/2019   COVID-19 Vaccine (4 - Booster for Pfizer series) 10/04/2020   MAMMOGRAM  04/08/2021    Colorectal cancer screening: No longer required.   Mammogram status: No longer required due to age - she is considering repeating  will let us know.  Bone Density status: Completed 04/20/2020. Results reflect: Bone density results: OSTEOPOROSIS. Repeat every 2 years.  Lung Cancer Screening: (Low Dose CT Chest recommended if Age 12-80 years, 30 pack-year currently smoking OR have quit w/in 15years.) does not qualify.   Additional Screening:  Hepatitis C Screening: does not qualify  Vision Screening: Recommended annual ophthalmology exams for early detection of glaucoma and other disorders of the eye. Is the patient up to date with their annual eye  exam?  Yes  Who is the provider or what is the name of the office in which the patient attends annual eye exams? [facility] If pt is not established with a provider, would they like to be referred to a provider to establish care? No .   Dental Screening: Recommended annual dental exams for proper oral hygiene  Community Resource Referral / Chronic Care Management: CRR required this visit?  No   CCM required this visit?  No      Plan:     I have personally reviewed and noted the following in the patient's chart:   Medical and social history Use of alcohol, tobacco or illicit drugs  Current medications and supplements including opioid prescriptions.  Functional ability and status Nutritional status Physical activity Advanced directives List of other physicians Hospitalizations, surgeries, and ER visits in previous 12  months Vitals Screenings to include cognitive, depression, and falls Referrals and appointments  In addition, I have reviewed and discussed with patient certain preventive protocols, quality metrics, and best practice recommendations. A written personalized care plan for preventive services as well as general preventive health recommendations were provided to patient.     Sandrea Hammond, LPN   6/81/1572   Nurse Notes: None

## 2022-02-28 NOTE — Patient Instructions (Signed)
[name] , Thank you for taking time to come for your Medicare Wellness Visit. I appreciate your ongoing commitment to your health goals. Please review the following plan we discussed and let me know if I can assist you in the future.   Screening recommendations/referrals: Colonoscopy: Done 12/2013 - no repeat Mammogram: Done 04/08/2020 - You may repeat annually, but this is no longer required after age 80 Bone Density: Done 04/10/2020 - Repeat every 2 years  Recommended yearly ophthalmology/optometry visit for glaucoma screening and checkup Recommended yearly dental visit for hygiene and checkup  Vaccinations: Influenza vaccine: Recommended every fall Pneumococcal vaccine: Done 11/14/2013 & 06/11/2015 Tdap vaccine: Done 01/27/2011 - Repeat in 10 years *due Shingles vaccine: Done 07/23/2019- you still need the second dose - get this at next visit or at the pharmacy   Covid-19: Done 10/31/2019, 11/22/2019, & 08/09/2020   Advanced directives: Please bring a copy of your health care power of attorney and living will to the office to be added to your chart at your convenience.   Conditions/risks identified: Aim for 30 minutes of exercise or brisk walking, 6-8 glasses of water, and 5 servings of fruits and vegetables each day.   Next appointment: Follow up in one year for your annual wellness visit    Preventive Care 65 Years and Older, Female Preventive care refers to lifestyle choices and visits with your health care provider that can promote health and wellness. What does preventive care include? A yearly physical exam. This is also called an annual well check. Dental exams once or twice a year. Routine eye exams. Ask your health care provider how often you should have your eyes checked. Personal lifestyle choices, including: Daily care of your teeth and gums. Regular physical activity. Eating a healthy diet. Avoiding tobacco and drug use. Limiting alcohol use. Practicing safe sex. Taking  low-dose aspirin every day. Taking vitamin and mineral supplements as recommended by your health care provider. What happens during an annual well check? The services and screenings done by your health care provider during your annual well check will depend on your age, overall health, lifestyle risk factors, and family history of disease. Counseling  Your health care provider may ask you questions about your: Alcohol use. Tobacco use. Drug use. Emotional well-being. Home and relationship well-being. Sexual activity. Eating habits. History of falls. Memory and ability to understand (cognition). Work and work Statistician. Reproductive health. Screening  You may have the following tests or measurements: Height, weight, and BMI. Blood pressure. Lipid and cholesterol levels. These may be checked every 5 years, or more frequently if you are over 64 years old. Skin check. Lung cancer screening. You may have this screening every year starting at age 28 if you have a 30-pack-year history of smoking and currently smoke or have quit within the past 15 years. Fecal occult blood test (FOBT) of the stool. You may have this test every year starting at age 10. Flexible sigmoidoscopy or colonoscopy. You may have a sigmoidoscopy every 5 years or a colonoscopy every 10 years starting at age 69. Hepatitis C blood test. Hepatitis B blood test. Sexually transmitted disease (STD) testing. Diabetes screening. This is done by checking your blood sugar (glucose) after you have not eaten for a while (fasting). You may have this done every 1-3 years. Bone density scan. This is done to screen for osteoporosis. You may have this done starting at age 50. Mammogram. This may be done every 1-2 years. Talk to your health care  provider about how often you should have regular mammograms. Talk with your health care provider about your test results, treatment options, and if necessary, the need for more tests. Vaccines   Your health care provider may recommend certain vaccines, such as: Influenza vaccine. This is recommended every year. Tetanus, diphtheria, and acellular pertussis (Tdap, Td) vaccine. You may need a Td booster every 10 years. Zoster vaccine. You may need this after age 75. Pneumococcal 13-valent conjugate (PCV13) vaccine. One dose is recommended after age 32. Pneumococcal polysaccharide (PPSV23) vaccine. One dose is recommended after age 65. Talk to your health care provider about which screenings and vaccines you need and how often you need them. This information is not intended to replace advice given to you by your health care provider. Make sure you discuss any questions you have with your health care provider. Document Released: 10/22/2015 Document Revised: 06/14/2016 Document Reviewed: 07/27/2015 Elsevier Interactive Patient Education  2017 [place] Prevention in the Home Falls can cause injuries. They can happen to people of all ages. There are many things you can do to make your home safe and to help prevent falls. What can I do on the outside of my home? Regularly fix the edges of walkways and driveways and fix any cracks. Remove anything that might make you trip as you walk through a door, such as a raised step or threshold. Trim any bushes or trees on the path to your home. Use bright outdoor lighting. Clear any walking paths of anything that might make someone trip, such as rocks or tools. Regularly check to see if handrails are loose or broken. Make sure that both sides of any steps have handrails. Any raised decks and porches should have guardrails on the edges. Have any leaves, snow, or ice cleared regularly. Use sand or salt on walking paths during winter. Clean up any spills in your garage right away. This includes oil or grease spills. What can I do in the bathroom? Use night lights. Install grab bars by the toilet and in the tub and shower. Do not use towel  bars as grab bars. Use non-skid mats or decals in the tub or shower. If you need to sit down in the shower, use a plastic, non-slip stool. Keep the floor dry. Clean up any water that spills on the floor as soon as it happens. Remove soap buildup in the tub or shower regularly. Attach bath mats securely with double-sided non-slip rug tape. Do not have throw rugs and other things on the floor that can make you trip. What can I do in the bedroom? Use night lights. Make sure that you have a light by your bed that is easy to reach. Do not use any sheets or blankets that are too big for your bed. They should not hang down onto the floor. Have a firm chair that has side arms. You can use this for support while you get dressed. Do not have throw rugs and other things on the floor that can make you trip. What can I do in the kitchen? Clean up any spills right away. Avoid walking on wet floors. Keep items that you use a lot in easy-to-reach places. If you need to reach something above you, use a strong step stool that has a grab bar. Keep electrical cords out of the way. Do not use floor polish or wax that makes floors slippery. If you must use wax, use non-skid floor wax. Do not have throw rugs  and other things on the floor that can make you trip. What can I do with my stairs? Do not leave any items on the stairs. Make sure that there are handrails on both sides of the stairs and use them. Fix handrails that are broken or loose. Make sure that handrails are as long as the stairways. Check any carpeting to make sure that it is firmly attached to the stairs. Fix any carpet that is loose or worn. Avoid having throw rugs at the top or bottom of the stairs. If you do have throw rugs, attach them to the floor with carpet tape. Make sure that you have a light switch at the top of the stairs and the bottom of the stairs. If you do not have them, ask someone to add them for you. What else can I do to help  prevent falls? Wear shoes that: Do not have high heels. Have rubber bottoms. Are comfortable and fit you well. Are closed at the toe. Do not wear sandals. If you use a stepladder: Make sure that it is fully opened. Do not climb a closed stepladder. Make sure that both sides of the stepladder are locked into place. Ask someone to hold it for you, if possible. Clearly mark and make sure that you can see: Any grab bars or handrails. First and last steps. Where the edge of each step is. Use tools that help you move around (mobility aids) if they are needed. These include: Canes. Walkers. Scooters. Crutches. Turn on the lights when you go into a dark area. Replace any light bulbs as soon as they burn out. Set up your furniture so you have a clear path. Avoid moving your furniture around. If any of your floors are uneven, fix them. If there are any pets around you, be aware of where they are. Review your medicines with your doctor. Some medicines can make you feel dizzy. This can increase your chance of falling. Ask your doctor what other things that you can do to help prevent falls. This information is not intended to replace advice given to you by your health care provider. Make sure you discuss any questions you have with your health care provider. Document Released: 07/22/2009 Document Revised: 03/02/2016 Document Reviewed: 10/30/2014 Elsevier Interactive Patient Education  2017 [employer].

## 2022-04-18 ENCOUNTER — Ambulatory Visit (INDEPENDENT_AMBULATORY_CARE_PROVIDER_SITE_OTHER): Payer: Medicare HMO | Admitting: Family Medicine

## 2022-04-18 ENCOUNTER — Encounter: Payer: Self-pay | Admitting: Family Medicine

## 2022-04-18 VITALS — BP 116/78 | HR 92 | Ht 61.5 in | Wt 134.6 lb

## 2022-04-18 DIAGNOSIS — E2839 Other primary ovarian failure: Secondary | ICD-10-CM | POA: Diagnosis not present

## 2022-04-18 DIAGNOSIS — K259 Gastric ulcer, unspecified as acute or chronic, without hemorrhage or perforation: Secondary | ICD-10-CM

## 2022-04-18 DIAGNOSIS — R944 Abnormal results of kidney function studies: Secondary | ICD-10-CM | POA: Insufficient documentation

## 2022-04-18 DIAGNOSIS — Z0000 Encounter for general adult medical examination without abnormal findings: Secondary | ICD-10-CM | POA: Diagnosis not present

## 2022-04-18 DIAGNOSIS — I10 Essential (primary) hypertension: Secondary | ICD-10-CM | POA: Diagnosis not present

## 2022-04-18 DIAGNOSIS — R7303 Prediabetes: Secondary | ICD-10-CM

## 2022-04-18 DIAGNOSIS — E7800 Pure hypercholesterolemia, unspecified: Secondary | ICD-10-CM | POA: Diagnosis not present

## 2022-04-18 DIAGNOSIS — Z1231 Encounter for screening mammogram for malignant neoplasm of breast: Secondary | ICD-10-CM | POA: Diagnosis not present

## 2022-04-18 DIAGNOSIS — E538 Deficiency of other specified B group vitamins: Secondary | ICD-10-CM | POA: Insufficient documentation

## 2022-04-18 DIAGNOSIS — Z79899 Other long term (current) drug therapy: Secondary | ICD-10-CM | POA: Insufficient documentation

## 2022-04-18 DIAGNOSIS — M8000XA Age-related osteoporosis with current pathological fracture, unspecified site, initial encounter for fracture: Secondary | ICD-10-CM | POA: Diagnosis not present

## 2022-04-18 DIAGNOSIS — Z1211 Encounter for screening for malignant neoplasm of colon: Secondary | ICD-10-CM | POA: Diagnosis not present

## 2022-04-18 LAB — LIPID PANEL
Cholesterol: 164 mg/dL (ref 0–200)
HDL: 44.4 mg/dL (ref >39.00)
LDL Cholesterol: 93 mg/dL (ref 0–99)
NonHDL: 119.29
Total CHOL/HDL Ratio: 4
Triglycerides: 133 mg/dL (ref 0.0–149.0)
VLDL: 26.6 mg/dL (ref 0.0–40.0)

## 2022-04-18 LAB — COMPREHENSIVE METABOLIC PANEL
ALT: 11 U/L (ref 0–35)
AST: 16 U/L (ref 0–37)
Albumin: 4.2 g/dL (ref 3.5–5.2)
Alkaline Phosphatase: 83 U/L (ref 39–117)
BUN: 22 mg/dL (ref 6–23)
CO2: 29 mEq/L (ref 19–32)
Calcium: 9.4 mg/dL (ref 8.4–10.5)
Chloride: 99 mEq/L (ref 96–112)
Creatinine, Ser: 1.19 mg/dL (ref 0.40–1.20)
GFR: 43.43 mL/min — ABNORMAL LOW (ref >60.00)
Glucose, Bld: 90 mg/dL (ref 70–99)
Potassium: 4.7 mEq/L (ref 3.5–5.1)
Sodium: 135 mEq/L (ref 135–145)
Total Bilirubin: 0.6 mg/dL (ref 0.2–1.2)
Total Protein: 6.5 g/dL (ref 6.0–8.3)

## 2022-04-18 LAB — CBC WITH DIFFERENTIAL/PLATELET
Basophils Absolute: 0 10*3/uL (ref 0.0–0.1)
Basophils Relative: 0.3 % (ref 0.0–3.0)
Eosinophils Absolute: 0.1 10*3/uL (ref 0.0–0.7)
Eosinophils Relative: 2 % (ref 0.0–5.0)
HCT: 37.9 % (ref 36.0–46.0)
Hemoglobin: 12.6 g/dL (ref 12.0–15.0)
Lymphocytes Relative: 18.9 % (ref 12.0–46.0)
Lymphs Abs: 1.4 10*3/uL (ref 0.7–4.0)
MCHC: 33.3 g/dL (ref 30.0–36.0)
MCV: 92.3 fl (ref 78.0–100.0)
Monocytes Absolute: 0.4 10*3/uL (ref 0.1–1.0)
Monocytes Relative: 4.8 % (ref 3.0–12.0)
Neutro Abs: 5.6 10*3/uL (ref 1.4–7.7)
Neutrophils Relative %: 74 % (ref 43.0–77.0)
Platelets: 232 10*3/uL (ref 150.0–400.0)
RBC: 4.11 Mil/uL (ref 3.87–5.11)
RDW: 12.8 % (ref 11.5–15.5)
WBC: 7.5 10*3/uL (ref 4.0–10.5)

## 2022-04-18 LAB — VITAMIN B12: Vitamin B-12: 132 pg/mL — ABNORMAL LOW (ref 211–911)

## 2022-04-18 LAB — TSH: TSH: 2.33 u[IU]/mL (ref 0.35–5.50)

## 2022-04-18 LAB — HEMOGLOBIN A1C: Hgb A1c MFr Bld: 6.1 % (ref 4.6–6.5)

## 2022-04-18 NOTE — Assessment & Plan Note (Signed)
Reviewed health habits including diet and exercise and skin cancer prevention Reviewed appropriate screening tests for age  Also reviewed health mt list, fam hx and immunization status , as well as social and family history   See HPI Labs ordered  Disc exercise plan  amw reviewed inst pt to get tetanus and shingrix shots at pharmacy Mammogram and dexa ordered Declines colon cancer screening

## 2022-04-18 NOTE — Assessment & Plan Note (Addendum)
bp in fair control at this time  BP Readings from Last 1 Encounters:  04/18/22 116/78   No changes needed Most recent labs reviewed  Disc lifstyle change with low sodium diet and exercise  Plan to continue  Lisinopril 5 mg daily Labs ordered

## 2022-04-18 NOTE — Assessment & Plan Note (Signed)
Declines further colon cancer screening  Colonoscopy was 2015

## 2022-04-18 NOTE — Assessment & Plan Note (Signed)
B12 added to labs 

## 2022-04-18 NOTE — Assessment & Plan Note (Signed)
Strongly enc better fluid intake Has 1 kidney  Lab today

## 2022-04-18 NOTE — Assessment & Plan Note (Signed)
Continues ppi Doing well

## 2022-04-18 NOTE — Assessment & Plan Note (Signed)
A1C ordered °disc imp of low glycemic diet and wt loss to prevent DM2  °

## 2022-04-18 NOTE — Patient Instructions (Addendum)
Get back to exercise classes - [facility] out the [facility]   [employer] yoga classes are excellent and can be done at home   Check out tetanus vaccine price at your pharmacy  Also get your 2nd shingrix when you can   For kidney health  Please drink 64 oz of fluids daily mostly water   Use sunscreen every day   Labs today    Please call and schedule your mammogram and bone density test   Please call the location of your choice from the menu below to schedule your Mammogram and/or Bone Density appointment.    [facility]                      Phone:  [phone] [address], [place] [address]                                                             Services: Traditional and 3D Mammogram, [facility]                 Phone: [phone] [address], [place] [address]    Service: Bone Density ONLY   *this site does NOT perform mammograms  [place]                        Phone:  [phone] [address], [place] [address]                                            Services:  3D Mammogram and [place] at [facility]   Phone:  [phone]   [address] [place], [place] [address]  Services: 3D Mammogram and Bone Density  [place] at [facility])  Phone:  [phone]   [address] [place], [place] [address]                                              Services:  3D Mammogram and Bone Density

## 2022-04-18 NOTE — Assessment & Plan Note (Signed)
dexa ordered Pt to schedule  H/o fibular fracture  Taking vit D 2000 iu daily  Getting back to exercise  Not a candidate for alendronate due to ulcers/esophagitis

## 2022-04-18 NOTE — Assessment & Plan Note (Signed)
Disc goals for lipids and reasons to control them Rev last labs with pt Rev low sat fat diet in detail Labs ordered Good diet  Taking atorvastatin 10 mg daily

## 2022-04-18 NOTE — Assessment & Plan Note (Signed)
Mammogram ordered  Pt to schedule Nl exam

## 2022-04-18 NOTE — Progress Notes (Signed)
Subjective:    Patient ID: [name], female    DOB: [birth year], 81 y.o.   MRN: [id number]  HPI Here for health maintenance exam and to review chronic medical problems    Wt Readings from Last 3 Encounters:  04/18/22 134 lb 9.6 oz (61.1 kg)  02/28/22 134 lb (60.8 kg)  04/27/21 134 lb 5 oz (60.9 kg)   25.02 kg/m  Taking care of great grand kids Really enjoying that   Feeling good overall   Needs to exercise more  (in the past liked classes at gym and they closed it)  Silver sneakers- not convenient  More tired  Nods off occasionally   Wakes up in the night with cramps  ? Why    Amw 5/23 reviewed   Immunization History  Administered Date(s) Administered   Influenza Split 09/20/2012   Influenza, High Dose Seasonal PF 06/26/2018, 07/09/2019   Influenza,inj,Quad PF,6+ Mos 06/11/2015   Influenza-Unspecified 07/09/2016   PFIZER(Purple Top)SARS-COV-2 Vaccination 10/31/2019, 11/22/2019, 08/09/2020   Pneumococcal Conjugate-13 06/11/2015   Pneumococcal Polysaccharide-23 11/14/2013   Td 03/31/2002   Tdap 01/27/2011   Zoster Recombinat (Shingrix) 07/23/2019   Zoster, Live 11/11/2011   Had one shingrix- is due for the 2nd   Mammogram 12/2009  has not had one in a while  Self breast exam: no lumps   Dexa 04/2020 OP of forearm Not candidate for alendronate due to esophagitis  Falls one Fractures-last year broke leg  Supplements -2000 iu D3 daily  D level of 78 Exercise -wants to get back to   Colon screening : colonoscopy 2015 Thinks she has done cologuard in the past  Declines more screening    HTN bp is stable today  No cp or palpitations or headaches or edema  No side effects to medicines  BP Readings from Last 3 Encounters:  04/18/22 116/78  04/27/21 128/76  03/21/21 (!) 93/48     Lab Results  Component Value Date   CREATININE 1.24 (H) 08/10/2021   BUN 25 (H) 08/10/2021   NA 138 08/10/2021   K 4.6 08/10/2021   CL 103 08/10/2021   CO2 29  08/10/2021   Last GFR 41.5 Needs a re check   Was inst to inc water- ? If she drinks water  Has one kidney  Lab Results  Component Value Date   ALT 13 08/10/2021   AST 19 08/10/2021   ALKPHOS 87 08/10/2021   BILITOT 0.3 08/10/2021   Lab Results  Component Value Date   WBC 6.6 08/10/2021   HGB 12.3 08/10/2021   HCT 37.3 08/10/2021   MCV 93.1 08/10/2021   PLT 226.0 08/10/2021   Lab Results  Component Value Date   TSH 3.28 08/10/2021    Hyperlipidemia Lab Results  Component Value Date   CHOL 161 08/10/2021   HDL 43.50 08/10/2021   LDLCALC 81 08/10/2021   TRIG 182.0 (H) 08/10/2021   CHOLHDL 4 08/10/2021  Atorvastatin 10 mg daily   Prediabetes Lab Results  Component Value Date   HGBA1C 5.9 08/10/2021   Esophagitis and gastric ulcers in the past Takes protonix  Patient Active Problem List   Diagnosis Date Noted   Current use of proton pump inhibitor 04/18/2022   Decreased GFR 04/18/2022   Multiple gastric ulcers 04/27/2021   Effusion of right knee joint 05/31/2020   Pain in right knee 05/31/2020   Pain in joint of left shoulder 05/20/2020   Pain in right foot 04/08/2020   Closed fracture of  shaft of right fibula 01/08/2020   Pain in joint of right ankle 01/08/2020   Hematoma of left breast 01/02/2020   Carpal tunnel syndrome 05/27/2018   Pain in finger of right hand 04/24/2018   Pain in joint of right foot 04/24/2018   Hyperkalemia 02/01/2018   Encounter for screening mammogram for breast cancer 01/19/2017   Estrogen deficiency 01/19/2017   Routine general medical examination at a health care facility 06/11/2015   Encounter for Medicare annual wellness exam 11/14/2013   Dermatitis 11/14/2013   Family history of colon cancer 11/14/2013   Colon cancer screening 11/14/2013   Hypertension 01/27/2011   Osteoporosis 01/27/2011   Post-menopausal 01/27/2011   Low back pain radiating to left leg 03/13/2008   Hyperlipidemia 12/20/2007   CONSTIPATION, CHRONIC  12/20/2007   Prediabetes 12/20/2007   Past Medical History:  Diagnosis Date   Back pain    Chronic constipation    past hx- no current constipation 02-2021   Hyperlipidemia    Hypertension    Multiple gastric ulcers    Osteopenia    Single kidney    donated kidney    Past Surgical History:  Procedure Laterality Date   CLEFT PALATE REPAIR     COLONOSCOPY     FOOT NEUROMA SURGERY  2/06   NEPHRECTOMY LIVING DONOR     Left   TUBAL LIGATION     UPPER GASTROINTESTINAL ENDOSCOPY     Social History   Tobacco Use   Smoking status: Never   Smokeless tobacco: Never  Vaping Use   Vaping Use: Never used  Substance Use Topics   Alcohol use: No   Drug use: No   Family History  Problem Relation Age of Onset   Colon cancer Mother    Diabetes Mother    Coronary artery disease Mother    Osteoporosis Mother    Dementia Mother    Coronary artery disease Father    Heart failure Father    Hypertension Father    Stomach cancer Neg Hx    Pancreatic cancer Neg Hx    Esophageal cancer Neg Hx    Liver disease Neg Hx    Colon polyps Neg Hx    Rectal cancer Neg Hx    Allergies  Allergen Reactions   Codeine    Hydrocodone Bit-Homatrop Mbr     Caused flu-like symptoms   Current Outpatient Medications on File Prior to Visit  Medication Sig Dispense Refill   aspirin 81 MG tablet Take 81 mg by mouth daily.     atorvastatin (LIPITOR) 10 MG tablet Take 1 tablet (10 mg total) by mouth daily. 90 tablet 3   D 1000 25 MCG (1000 UT) capsule SMARTSIG:1 By Mouth     desonide (DESOWEN) 0.05 % ointment Apply 1 application. topically 2 (two) times daily.     lisinopril (ZESTRIL) 5 MG tablet Take 1 tablet (5 mg total) by mouth daily. 90 tablet 3   pantoprazole (PROTONIX) 40 MG tablet TAKE 1 TABLET BY MOUTH TWICE A DAY 180 tablet 1   Misc Natural Products (RA GLUCOSAMINE-CHONDROIT-MSM-D) TABS  (Patient not taking: Reported on 04/18/2022)     No current facility-administered medications on file  prior to visit.     Review of Systems  Constitutional:  Negative for activity change, appetite change, fatigue, fever and unexpected weight change.  HENT:  Negative for congestion, ear pain, rhinorrhea, sinus pressure and sore throat.   Eyes:  Negative for pain, redness and visual disturbance.  Respiratory:  Negative for cough, shortness of breath and wheezing.   Cardiovascular:  Negative for chest pain and palpitations.  Gastrointestinal:  Negative for abdominal pain, blood in stool, constipation and diarrhea.  Endocrine: Negative for polydipsia and polyuria.  Genitourinary:  Negative for dysuria, frequency and urgency.  Musculoskeletal:  Negative for arthralgias, back pain and myalgias.  Skin:  Negative for pallor and rash.  Allergic/Immunologic: Negative for environmental allergies.  Neurological:  Negative for dizziness, syncope and headaches.  Hematological:  Negative for adenopathy. Does not bruise/bleed easily.  Psychiatric/Behavioral:  Negative for decreased concentration and dysphoric mood. The patient is not nervous/anxious.        Objective:   Physical Exam Constitutional:      General: She is not in acute distress.    Appearance: Normal appearance. She is well-developed and normal weight. She is not ill-appearing or diaphoretic.  HENT:     Head: Normocephalic and atraumatic.     Right Ear: Tympanic membrane, ear canal and external ear normal.     Left Ear: Tympanic membrane, ear canal and external ear normal.     Nose: Nose normal. No congestion.     Mouth/Throat:     Mouth: Mucous membranes are moist.     Pharynx: Oropharynx is clear. No posterior oropharyngeal erythema.  Eyes:     General: No scleral icterus.    Extraocular Movements: Extraocular movements intact.     Conjunctiva/sclera: Conjunctivae normal.     Pupils: Pupils are equal, round, and reactive to light.  Neck:     Thyroid: No thyromegaly.     Vascular: No carotid bruit or JVD.  Cardiovascular:      Rate and Rhythm: Normal rate and regular rhythm.     Pulses: Normal pulses.     Heart sounds: Normal heart sounds.     No gallop.  Pulmonary:     Effort: Pulmonary effort is normal. No respiratory distress.     Breath sounds: Normal breath sounds. No wheezing.     Comments: Good air exch Chest:     Chest wall: No tenderness.  Abdominal:     General: Bowel sounds are normal. There is no distension or abdominal bruit.     Palpations: Abdomen is soft. There is no mass.     Tenderness: There is no abdominal tenderness.     Hernia: No hernia is present.  Genitourinary:    Comments: Breast exam: No mass, nodules, thickening, tenderness, bulging, retraction, inflamation, nipple discharge or skin changes noted.  No axillary or clavicular LA.     Musculoskeletal:        General: No tenderness. Normal range of motion.     Cervical back: Normal range of motion and neck supple. No rigidity. No muscular tenderness.     Right lower leg: No edema.     Left lower leg: No edema.     Comments: Borderline kyphosis   Lymphadenopathy:     Cervical: No cervical adenopathy.  Skin:    General: Skin is warm and dry.     Coloration: Skin is not pale.     Findings: No erythema or rash.     Comments: Solar lentigines diffusely Some sks  Neurological:     Mental Status: She is alert. Mental status is at baseline.     Cranial Nerves: No cranial nerve deficit.     Motor: No abnormal muscle tone.     Coordination: Coordination normal.     Gait: Gait normal.     Deep  Tendon Reflexes: Reflexes are normal and symmetric. Reflexes normal.  Psychiatric:        Mood and Affect: Mood normal.        Cognition and Memory: Cognition and memory normal.           Assessment & Plan:   Problem List Items Addressed This Visit       Cardiovascular and Mediastinum   Hypertension    bp in fair control at this time  BP Readings from Last 1 Encounters:  04/18/22 116/78  No changes needed Most recent labs  reviewed  Disc lifstyle change with low sodium diet and exercise  Plan to continue  Lisinopril 5 mg daily Labs ordered      Relevant Orders   TSH (Completed)   Lipid panel (Completed)   Comprehensive metabolic panel (Completed)   CBC with Differential/Platelet (Completed)     Digestive   Multiple gastric ulcers    Continues ppi Doing well        Musculoskeletal and Integument   Osteoporosis    dexa ordered Pt to schedule  H/o fibular fracture  Taking vit D 2000 iu daily  Getting back to exercise  Not a candidate for alendronate due to ulcers/esophagitis         Other   Colon cancer screening    Declines further colon cancer screening  Colonoscopy was 2015      Current use of proton pump inhibitor    B12 added to labs      Relevant Orders   Vitamin B12 (Completed)   Decreased GFR    Strongly enc better fluid intake Has 1 kidney  Lab today      Relevant Orders   Comprehensive metabolic panel (Completed)   Encounter for screening mammogram for breast cancer    Mammogram ordered  Pt to schedule Nl exam       Relevant Orders   MM 3D SCREEN BREAST BILATERAL   Estrogen deficiency   Relevant Orders   DG Bone Density   Hyperlipidemia    Disc goals for lipids and reasons to control them Rev last labs with pt Rev low sat fat diet in detail Labs ordered Good diet  Taking atorvastatin 10 mg daily       Relevant Orders   Lipid panel (Completed)   Prediabetes    A1C ordered disc imp of low glycemic diet and wt loss to prevent DM2       Relevant Orders   Hemoglobin A1c (Completed)   Routine general medical examination at a health care facility - Primary    Reviewed health habits including diet and exercise and skin cancer prevention Reviewed appropriate screening tests for age  Also reviewed health mt list, fam hx and immunization status , as well as social and family history   See HPI Labs ordered  Disc exercise plan  amw reviewed inst pt to  get tetanus and shingrix shots at pharmacy Mammogram and dexa ordered Declines colon cancer screening

## 2022-04-26 ENCOUNTER — Ambulatory Visit (INDEPENDENT_AMBULATORY_CARE_PROVIDER_SITE_OTHER): Payer: Medicare HMO | Admitting: Family Medicine

## 2022-04-26 ENCOUNTER — Encounter: Payer: Self-pay | Admitting: Family Medicine

## 2022-04-26 VITALS — BP 112/68 | HR 81 | Ht 61.5 in | Wt 136.2 lb

## 2022-04-26 DIAGNOSIS — R944 Abnormal results of kidney function studies: Secondary | ICD-10-CM

## 2022-04-26 DIAGNOSIS — E538 Deficiency of other specified B group vitamins: Secondary | ICD-10-CM

## 2022-04-26 DIAGNOSIS — K259 Gastric ulcer, unspecified as acute or chronic, without hemorrhage or perforation: Secondary | ICD-10-CM

## 2022-04-26 DIAGNOSIS — Z79899 Other long term (current) drug therapy: Secondary | ICD-10-CM | POA: Diagnosis not present

## 2022-04-26 LAB — POCT UA - MICROSCOPIC ONLY
Bacteria, U Microscopic: 0
Casts, Ur, LPF, POC: 0
WBC, Ur, HPF, POC: 0 (ref 0–5)
Yeast, UA: 0

## 2022-04-26 LAB — POC URINALSYSI DIPSTICK (AUTOMATED)
Bilirubin, UA: NEGATIVE
Blood, UA: NEGATIVE
Glucose, UA: NEGATIVE
Ketones, UA: NEGATIVE
Leukocytes, UA: NEGATIVE
Nitrite, UA: NEGATIVE
Protein, UA: NEGATIVE
Spec Grav, UA: 1.02 (ref 1.010–1.025)
Urobilinogen, UA: 0.2 E.U./dL
pH, UA: 6 (ref 5.0–8.0)

## 2022-04-26 MED ORDER — CYANOCOBALAMIN 1000 MCG/ML IJ SOLN
1000.0000 ug | Freq: Once | INTRAMUSCULAR | Status: AC
  Administered 2022-04-26: 1000 ug via INTRAMUSCULAR

## 2022-04-26 NOTE — Assessment & Plan Note (Signed)
B12 is low

## 2022-04-26 NOTE — Progress Notes (Signed)
Subjective:    Patient ID: [name], female    DOB: 10/08/1941, 81 y.o.   MRN: [id number]  HPI Pt presents for f/u of vit b12 def and reduced GFR  Wt Readings from Last 3 Encounters:  04/26/22 136 lb 3.2 oz (61.8 kg)  04/18/22 134 lb 9.6 oz (61.1 kg)  02/28/22 134 lb (60.8 kg)   25.32 kg/m   Lab Results  Component Value Date   CREATININE 1.19 04/18/2022   BUN 22 04/18/2022   NA 135 04/18/2022   K 4.7 04/18/2022   CL 99 04/18/2022   CO2 29 04/18/2022    GFR 43.43 Did donate a kidney in the past   She drinks about 3 bottles (16 oz?)  She drinks decaf coffee  Decaf tea  Lemonade  Diet coke    Takes protonix 40 mg daily   Takes occ nsaid       For HTN lisinopril 5 mg daily  BP Readings from Last 3 Encounters:  04/26/22 112/68  04/18/22 116/78  04/27/21 128/76      Lab Results  Component Value Date   ALT 11 04/18/2022   AST 16 04/18/2022   ALKPHOS 83 04/18/2022   BILITOT 0.6 04/18/2022   Lab Results  Component Value Date   WBC 7.5 04/18/2022   HGB 12.6 04/18/2022   HCT 37.9 04/18/2022   MCV 92.3 04/18/2022   PLT 232.0 04/18/2022    Low vit B12 Lab Results  Component Value Date   VITAMINB12 132 (L) 04/18/2022  She takes ppi  Her short term memory is not as good as it was in the past  Energy level is pretty good   Needs to exercise more    Results for orders placed or performed in visit on 04/26/22  POCT UA - Microscopic Only  Result Value Ref Range   WBC, Ur, HPF, POC 0 0 - 5   RBC, Urine, Miroscopic 0-1 0 - 2   Bacteria, U Microscopic 0 None - Trace   Mucus, UA few    Epithelial cells, urine per micros 0-1    Crystals, Ur, HPF, POC mod    Casts, Ur, LPF, POC 0    Yeast, UA 0   POCT Urinalysis Dipstick (Automated)  Result Value Ref Range   Color, UA light yellow    Clarity, UA clear    Glucose, UA Negative Negative   Bilirubin, UA neg    Ketones, UA neg    Spec Grav, UA 1.020 1.010 - 1.025   Blood, UA neg    pH, UA  6.0 5.0 - 8.0   Protein, UA Negative Negative   Urobilinogen, UA 0.2 0.2 or 1.0 E.U./dL   Nitrite, UA neg    Leukocytes, UA Negative Negative    Patient Active Problem List   Diagnosis Date Noted   Current use of proton pump inhibitor 04/18/2022   Decreased GFR 04/18/2022   Vitamin B12 deficiency 04/18/2022   Multiple gastric ulcers 04/27/2021   Effusion of right knee joint 05/31/2020   Pain in right knee 05/31/2020   Pain in joint of left shoulder 05/20/2020   Pain in right foot 04/08/2020   Closed fracture of shaft of right fibula 01/08/2020   Pain in joint of right ankle 01/08/2020   Hematoma of left breast 01/02/2020   Carpal tunnel syndrome 05/27/2018   Pain in finger of right hand 04/24/2018   Pain in joint of right foot 04/24/2018   Hyperkalemia 02/01/2018  Encounter for screening mammogram for breast cancer 01/19/2017   Estrogen deficiency 01/19/2017   Routine general medical examination at a health care facility 06/11/2015   Encounter for Medicare annual wellness exam 11/14/2013   Dermatitis 11/14/2013   Family history of colon cancer 11/14/2013   Colon cancer screening 11/14/2013   Hypertension 01/27/2011   Osteoporosis 01/27/2011   Post-menopausal 01/27/2011   Low back pain radiating to left leg 03/13/2008   Hyperlipidemia 12/20/2007   CONSTIPATION, CHRONIC 12/20/2007   Prediabetes 12/20/2007   Past Medical History:  Diagnosis Date   Back pain    Chronic constipation    past hx- no current constipation 02-2021   Hyperlipidemia    Hypertension    Multiple gastric ulcers    Osteopenia    Single kidney    donated kidney    Past Surgical History:  Procedure Laterality Date   CLEFT PALATE REPAIR     COLONOSCOPY     FOOT NEUROMA SURGERY  2/06   NEPHRECTOMY LIVING DONOR     Left   TUBAL LIGATION     UPPER GASTROINTESTINAL ENDOSCOPY     Social History   Tobacco Use   Smoking status: Never   Smokeless tobacco: Never  Vaping Use   Vaping Use: Never  used  Substance Use Topics   Alcohol use: No   Drug use: No   Family History  Problem Relation Age of Onset   Colon cancer Mother    Diabetes Mother    Coronary artery disease Mother    Osteoporosis Mother    Dementia Mother    Coronary artery disease Father    Heart failure Father    Hypertension Father    Stomach cancer Neg Hx    Pancreatic cancer Neg Hx    Esophageal cancer Neg Hx    Liver disease Neg Hx    Colon polyps Neg Hx    Rectal cancer Neg Hx    Allergies  Allergen Reactions   Codeine    Hydrocodone Bit-Homatrop Mbr     Caused flu-like symptoms   Current Outpatient Medications on File Prior to Visit  Medication Sig Dispense Refill   aspirin 81 MG tablet Take 81 mg by mouth daily.     atorvastatin (LIPITOR) 10 MG tablet Take 1 tablet (10 mg total) by mouth daily. 90 tablet 3   D 1000 25 MCG (1000 UT) capsule SMARTSIG:1 By Mouth     desonide (DESOWEN) 0.05 % ointment Apply 1 application. topically 2 (two) times daily.     lisinopril (ZESTRIL) 5 MG tablet Take 1 tablet (5 mg total) by mouth daily. 90 tablet 3   Misc Natural Products (RA GLUCOSAMINE-CHONDROIT-MSM-D) TABS      pantoprazole (PROTONIX) 40 MG tablet TAKE 1 TABLET BY MOUTH TWICE A DAY 180 tablet 1   No current facility-administered medications on file prior to visit.     Review of Systems  Constitutional:  Negative for activity change, appetite change, fatigue, fever and unexpected weight change.  HENT:  Negative for congestion, ear pain, rhinorrhea, sinus pressure and sore throat.   Eyes:  Negative for pain, redness and visual disturbance.  Respiratory:  Negative for cough, shortness of breath and wheezing.   Cardiovascular:  Negative for chest pain and palpitations.  Gastrointestinal:  Negative for abdominal pain, blood in stool, constipation and diarrhea.  Endocrine: Negative for polydipsia and polyuria.  Genitourinary:  Negative for dysuria, frequency and urgency.  Musculoskeletal:  Negative  for arthralgias, back pain and myalgias.  Skin:  Negative for pallor and rash.  Allergic/Immunologic: Negative for environmental allergies.  Neurological:  Negative for dizziness, syncope, numbness and headaches.  Hematological:  Negative for adenopathy. Does not bruise/bleed easily.  Psychiatric/Behavioral:  Negative for decreased concentration and dysphoric mood. The patient is not nervous/anxious.        Some short term memory issues/word retrieval  No confusion        Objective:   Physical Exam Constitutional:      General: She is not in acute distress.    Appearance: Normal appearance. She is well-developed and normal weight. She is not ill-appearing or diaphoretic.  HENT:     Head: Normocephalic and atraumatic.  Eyes:     Conjunctiva/sclera: Conjunctivae normal.     Pupils: Pupils are equal, round, and reactive to light.  Neck:     Thyroid: No thyromegaly.     Vascular: No carotid bruit or JVD.  Cardiovascular:     Rate and Rhythm: Normal rate and regular rhythm.     Heart sounds: Normal heart sounds.     No gallop.  Pulmonary:     Effort: Pulmonary effort is normal. No respiratory distress.     Breath sounds: Normal breath sounds. No wheezing or rales.  Abdominal:     General: There is no distension or abdominal bruit.     Palpations: Abdomen is soft.     Tenderness: There is no right CVA tenderness or left CVA tenderness.     Comments: No suprapubic tenderness or fullness    Musculoskeletal:     Cervical back: Normal range of motion and neck supple.     Right lower leg: No edema.     Left lower leg: No edema.  Lymphadenopathy:     Cervical: No cervical adenopathy.  Skin:    General: Skin is warm and dry.     Coloration: Skin is not jaundiced or pale.     Findings: No bruising or rash.  Neurological:     Mental Status: She is alert.     Cranial Nerves: No cranial nerve deficit.     Sensory: No sensory deficit.     Motor: No weakness.     Coordination:  Coordination normal.     Gait: Gait normal.     Deep Tendon Reflexes: Reflexes are normal and symmetric. Reflexes normal.  Psychiatric:        Mood and Affect: Mood normal.           Assessment & Plan:   Problem List Items Addressed This Visit       Digestive   Multiple gastric ulcers    Continues ppi This may be reason for low B12 Will need to continue per GI/unsure how long        Other   Current use of proton pump inhibitor    B12 is low      Decreased GFR    In setting of one kidney (donated in past)   Encouraged fluids /made rec for 64 oz daily min Unsure how long she will be on ppi  Enc strongly to avoid nsaids for this and GI ulcers ua reviewed /crystals  Will continue to monitor  Lab in 2 mo       Relevant Orders   POCT UA - Microscopic Only (Completed)   POCT Urinalysis Dipstick (Automated) (Completed)   Vitamin B12 deficiency - Primary    Lab Results  Component Value Date   VITAMINB12 132 (L) 04/18/2022  Recommend b  1d shot weekly for 4 weeks 1000 mcg oral daily  Re check 2 mo and make plan for further supplement ppi may play a role No symptoms except short term memory change Nl exam

## 2022-04-26 NOTE — Assessment & Plan Note (Signed)
In setting of one kidney (donated in past)   Encouraged fluids /made rec for 64 oz daily min Unsure how long she will be on ppi  Enc strongly to avoid nsaids for this and GI ulcers ua reviewed /crystals  Will continue to monitor  Lab in 2 mo

## 2022-04-26 NOTE — Patient Instructions (Addendum)
I want you to increase fluids to at least 64 oz daily -mostly water   The only thing you can take over the counter for pain is tylenol   No NSAIDs  Ibuprofen/ aleve   Get vitamin B12 over the counter and take 1000 mcg daily   We want to start B12 shots  One per week for 4 weeks   Then in 2 months I want to check another level  We can also re check your kidney function   Urinalysis today  Eat a healthy balanced diet

## 2022-04-26 NOTE — Assessment & Plan Note (Signed)
Continues ppi This may be reason for low B12 Will need to continue per GI/unsure how long

## 2022-04-26 NOTE — Assessment & Plan Note (Signed)
Lab Results  Component Value Date   VITAMINB12 132 (L) 04/18/2022   Recommend b 1d shot weekly for 4 weeks 1000 mcg oral daily  Re check 2 mo and make plan for further supplement ppi may play a role No symptoms except short term memory change Nl exam

## 2022-05-03 ENCOUNTER — Ambulatory Visit: Payer: Medicare HMO

## 2022-05-04 ENCOUNTER — Ambulatory Visit (INDEPENDENT_AMBULATORY_CARE_PROVIDER_SITE_OTHER): Payer: Medicare HMO

## 2022-05-04 DIAGNOSIS — E538 Deficiency of other specified B group vitamins: Secondary | ICD-10-CM | POA: Diagnosis not present

## 2022-05-04 MED ORDER — CYANOCOBALAMIN 1000 MCG/ML IJ SOLN
1000.0000 ug | Freq: Once | INTRAMUSCULAR | Status: AC
  Administered 2022-05-04: 1000 ug via INTRAMUSCULAR

## 2022-05-04 NOTE — Progress Notes (Signed)
Per orders of Dr. Tower, injection of vit B12 given by Damarie Schoolfield. Patient tolerated injection well.  

## 2022-05-06 ENCOUNTER — Other Ambulatory Visit: Payer: Self-pay | Admitting: Family Medicine

## 2022-05-11 ENCOUNTER — Ambulatory Visit (INDEPENDENT_AMBULATORY_CARE_PROVIDER_SITE_OTHER): Payer: Medicare HMO

## 2022-05-11 DIAGNOSIS — E538 Deficiency of other specified B group vitamins: Secondary | ICD-10-CM

## 2022-05-11 MED ORDER — CYANOCOBALAMIN 1000 MCG/ML IJ SOLN
1000.0000 ug | Freq: Once | INTRAMUSCULAR | Status: AC
  Administered 2022-05-11: 1000 ug via INTRAMUSCULAR

## 2022-05-11 NOTE — Progress Notes (Signed)
Per orders of Dr. Glori Bickers, #3 of 4 weekly injection of B12 1000 mcg/ml given by Pilar Grammes, CMA in Right Deltoid. Patient tolerated injection well.

## 2022-05-15 IMAGING — CR DG CHEST 2V
3 series · 3 of 3 positions shown · non-contrast
Comparison: None.

CLINICAL DATA: Epigastric pain

EXAM:
CHEST - 2 VIEW

[w chest pa]
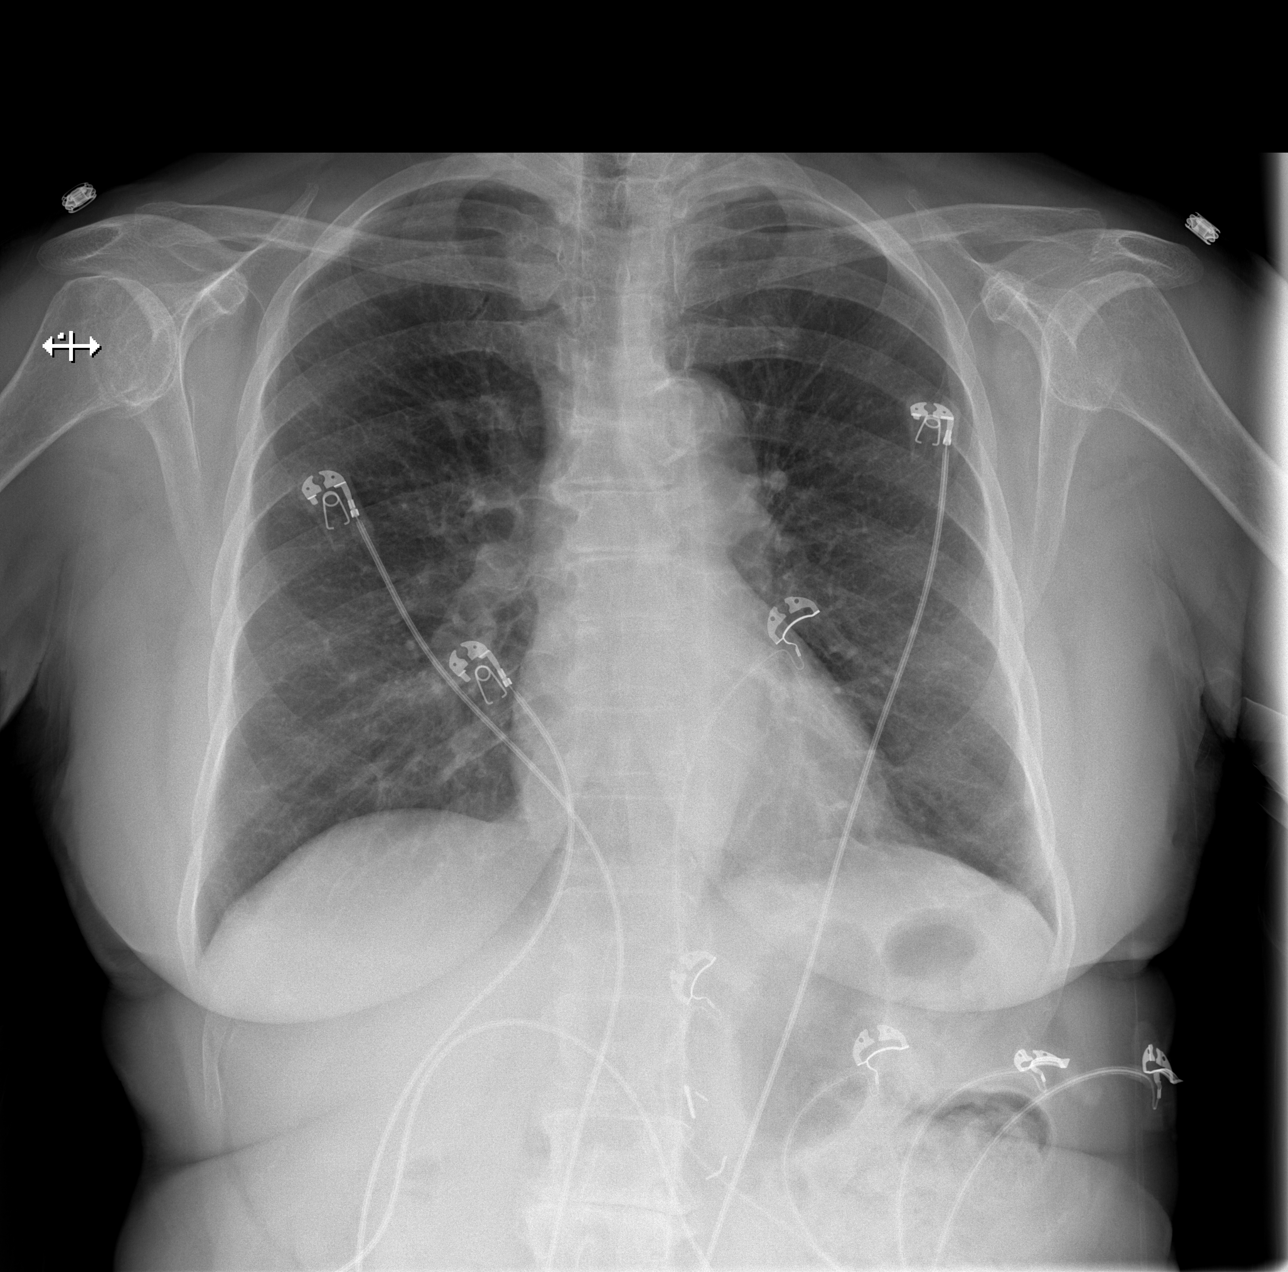

[w chest lat (1 of 2)]
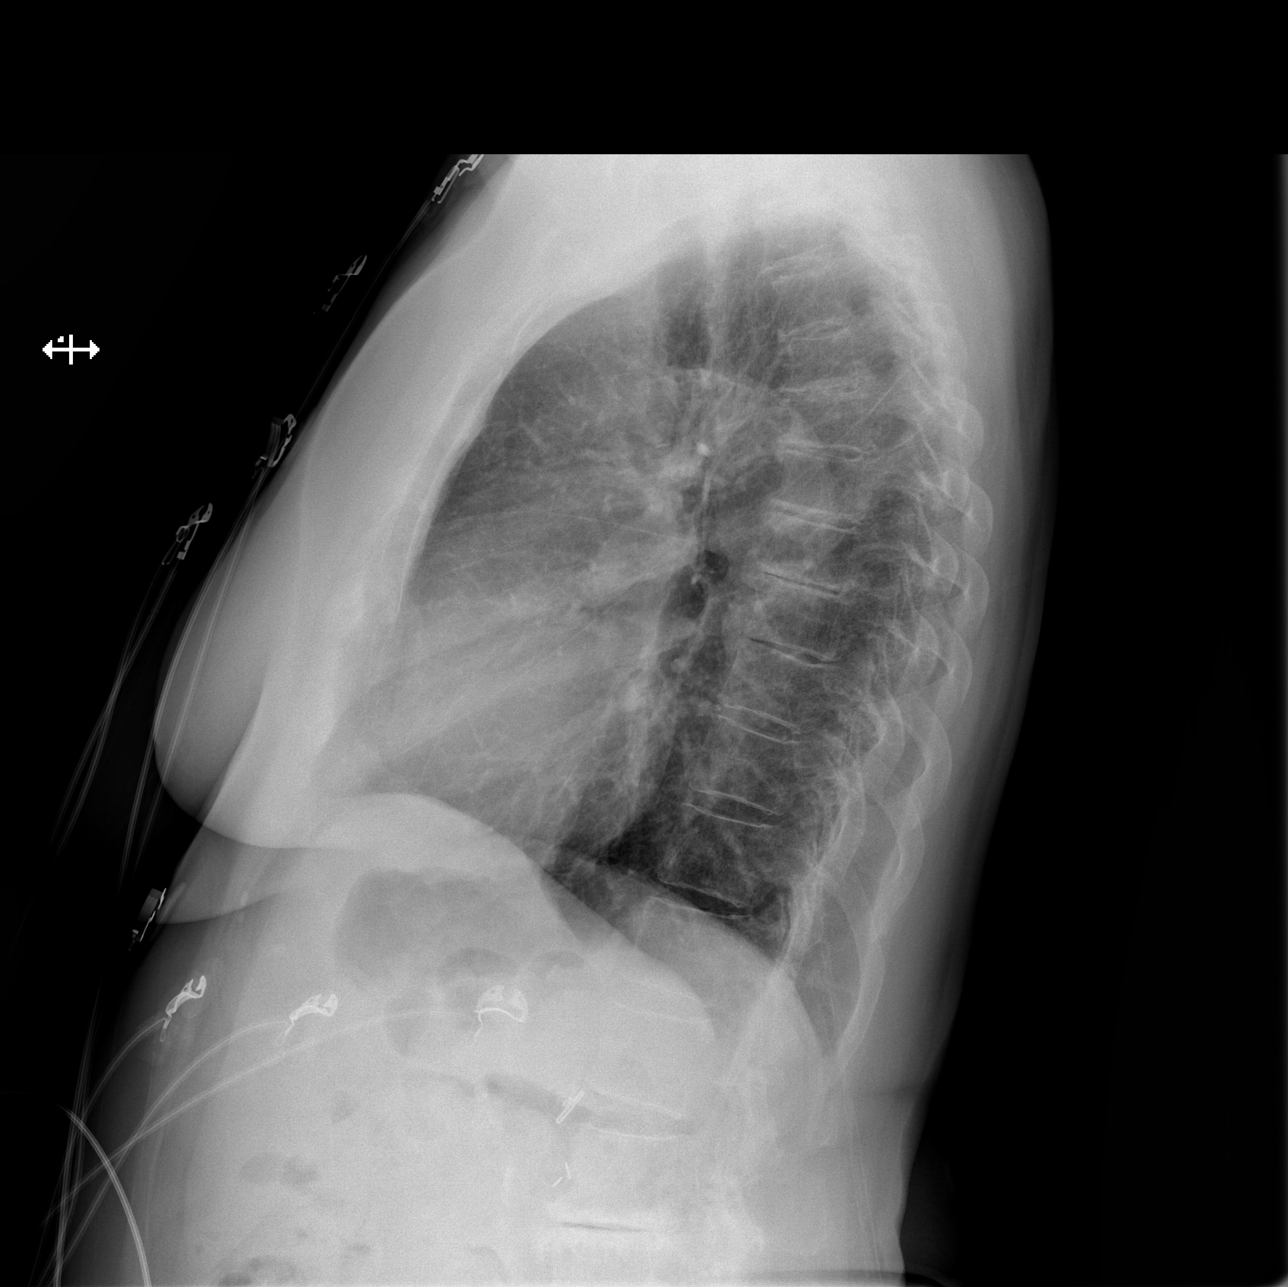

[w chest lat (2 of 2)]
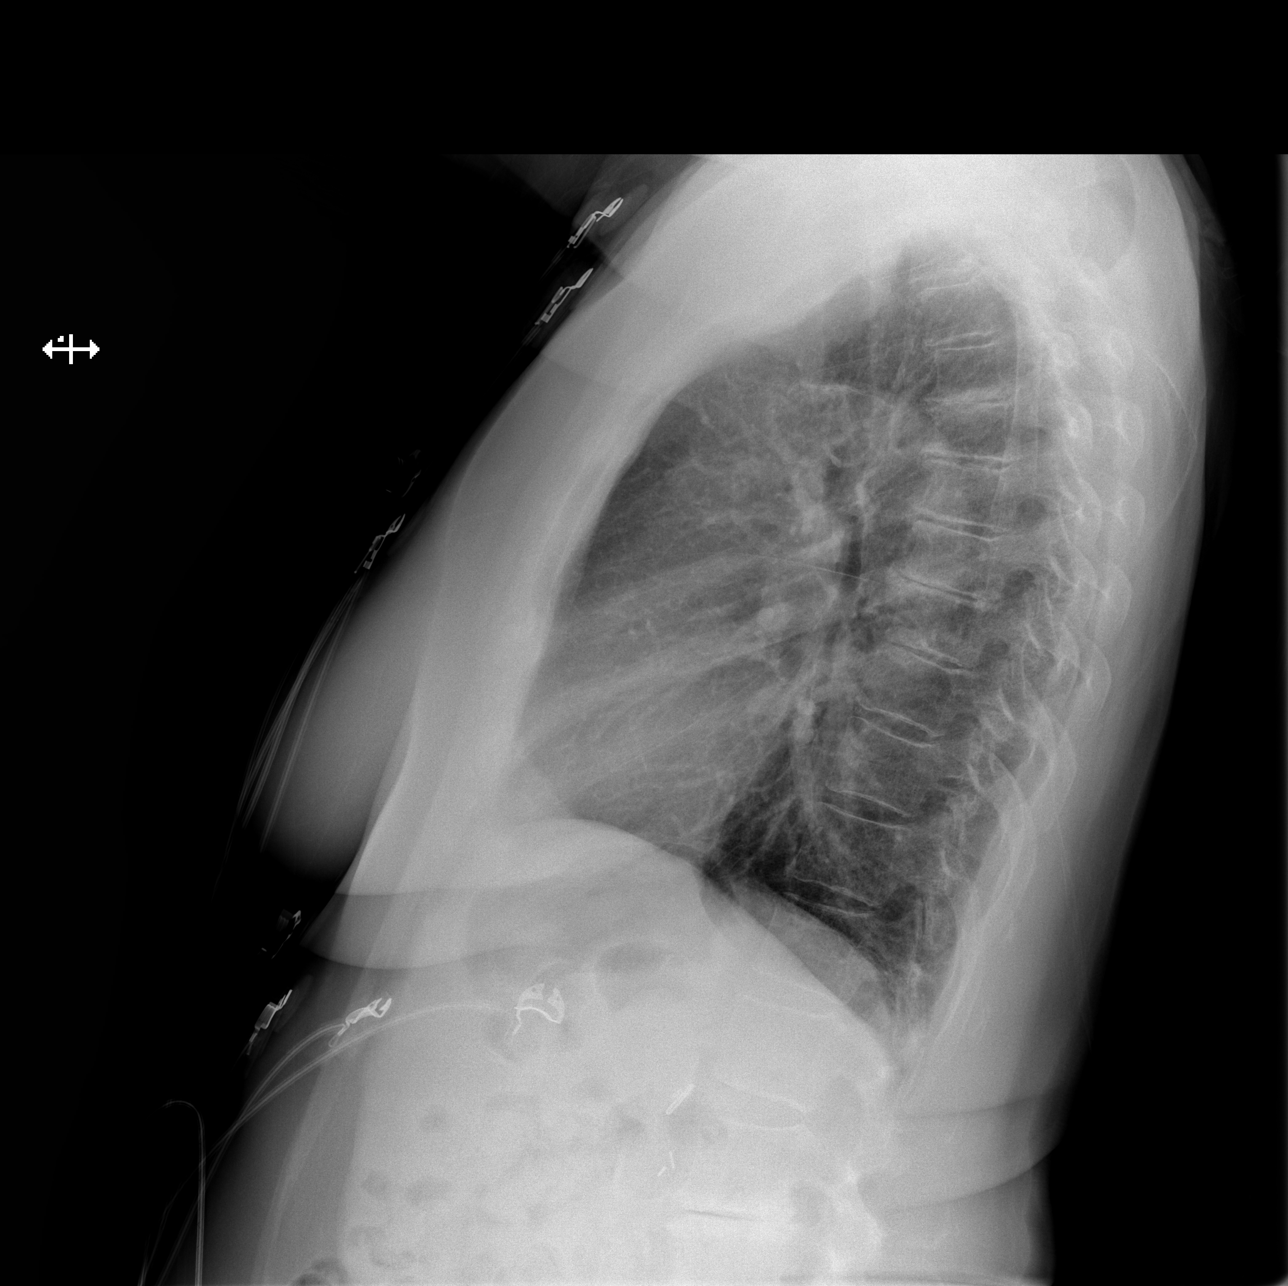

[3 of 3 positions shown; findings below may reference images not displayed]

FINDINGS: Included upper abdomen is unremarkable. No consolidation or edema.
No pleural effusion. Cardiomediastinal contours are within normal
limits. Calcified plaques present along the aortic arch. No acute
osseous abnormality. Midthoracic spine degenerative changes.
IMPRESSION: No acute process in the chest.

## 2022-05-16 DIAGNOSIS — H35371 Puckering of macula, right eye: Secondary | ICD-10-CM | POA: Diagnosis not present

## 2022-05-16 DIAGNOSIS — H43813 Vitreous degeneration, bilateral: Secondary | ICD-10-CM | POA: Diagnosis not present

## 2022-05-16 DIAGNOSIS — H25013 Cortical age-related cataract, bilateral: Secondary | ICD-10-CM | POA: Diagnosis not present

## 2022-05-16 DIAGNOSIS — H2513 Age-related nuclear cataract, bilateral: Secondary | ICD-10-CM | POA: Diagnosis not present

## 2022-05-17 ENCOUNTER — Ambulatory Visit (INDEPENDENT_AMBULATORY_CARE_PROVIDER_SITE_OTHER): Payer: Medicare HMO

## 2022-05-17 DIAGNOSIS — E538 Deficiency of other specified B group vitamins: Secondary | ICD-10-CM | POA: Diagnosis not present

## 2022-05-17 MED ORDER — CYANOCOBALAMIN 1000 MCG/ML IJ SOLN
1000.0000 ug | Freq: Once | INTRAMUSCULAR | Status: AC
  Administered 2022-05-17: 1000 ug via INTRAMUSCULAR

## 2022-05-17 NOTE — Progress Notes (Signed)
Patient presented for B 12 injection given by Bellany Elbaum, CMA to left deltoid, patient voiced no concerns nor showed any signs of distress during injection.  

## 2022-05-24 ENCOUNTER — Ambulatory Visit (INDEPENDENT_AMBULATORY_CARE_PROVIDER_SITE_OTHER): Payer: Medicare HMO

## 2022-05-24 DIAGNOSIS — E538 Deficiency of other specified B group vitamins: Secondary | ICD-10-CM | POA: Diagnosis not present

## 2022-05-24 MED ORDER — CYANOCOBALAMIN 1000 MCG/ML IJ SOLN
1000.0000 ug | Freq: Once | INTRAMUSCULAR | Status: AC
  Administered 2022-05-24: 1000 ug via INTRAMUSCULAR

## 2022-05-24 NOTE — Progress Notes (Signed)
Patient presented for B 12 injection given by Raed Schalk, CMA to right deltoid, patient voiced no concerns nor showed any signs of distress during injection.  

## 2022-06-16 ENCOUNTER — Ambulatory Visit
Admission: RE | Admit: 2022-06-16 | Discharge: 2022-06-16 | Disposition: A | Discharge status: Home / Self Care | Payer: Medicare HMO | Source: Ambulatory Visit | Attending: Family Medicine | Admitting: Family Medicine

## 2022-06-16 DIAGNOSIS — Z1231 Encounter for screening mammogram for malignant neoplasm of breast: Secondary | ICD-10-CM | POA: Diagnosis not present

## 2022-06-17 IMAGING — CT CT ABD-PELV W/ CM
2 of 5 series · 16 of 46 positions shown, 18 images · IV contrast (APPLIED)
Comparison: None.

CLINICAL DATA: Epigastric abdominal pain for 1 month. Recent
pancreatitis.

EXAM:
CT ABDOMEN AND PELVIS WITH CONTRAST
TECHNIQUE: Multidetector CT imaging of the abdomen and pelvis was performed
using the standard protocol following bolus administration of
intravenous contrast.
CONTRAST:  100mL OMNIPAQUE IOHEXOL 300 MG/ML  SOLN

[Series 2: axial st · axial · 0.76mm/px · z∈[+763,+1153]mm · 13 of 91 slices shown, 15 images]
[im 7/91  soft-tissue]
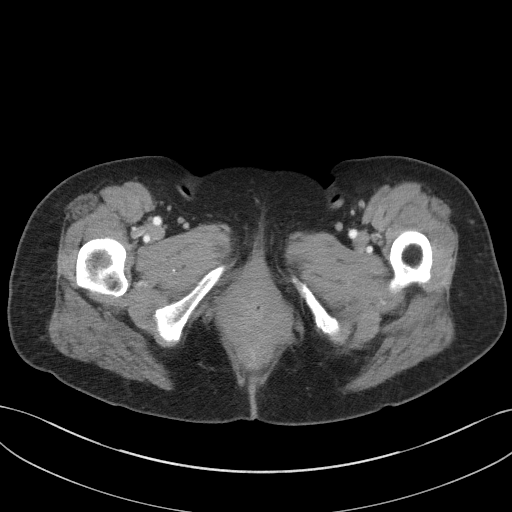
[im 7/91  bone]
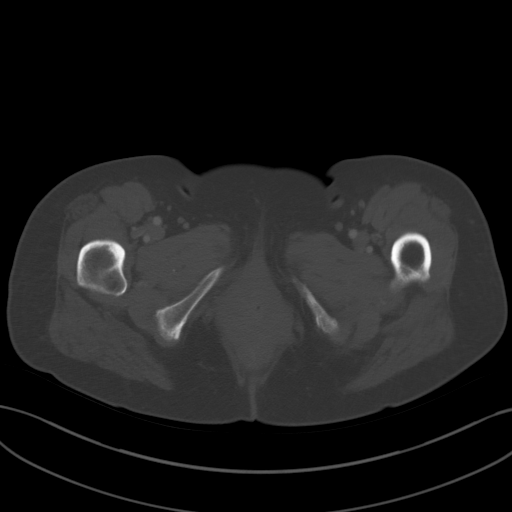
[im 13/91  soft-tissue]
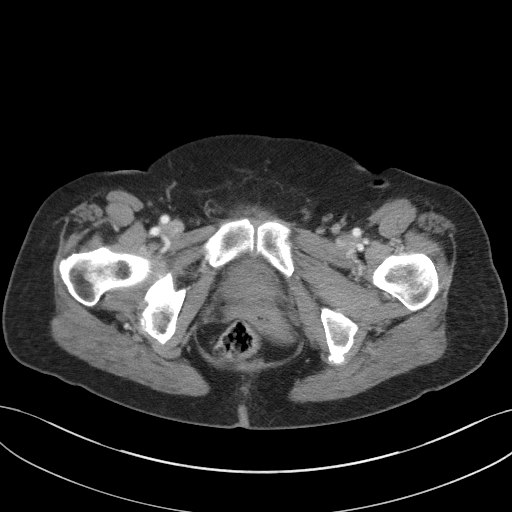
[im 19/91  soft-tissue]
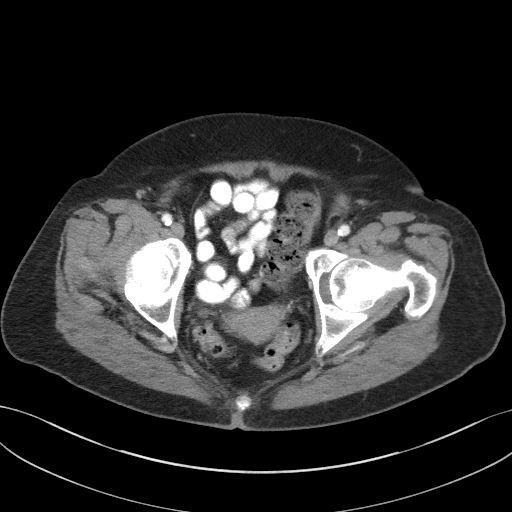
[im 25/91  soft-tissue]
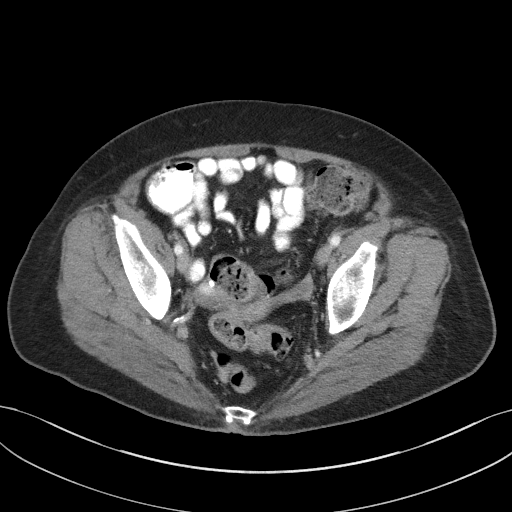
[im 31/91  soft-tissue]
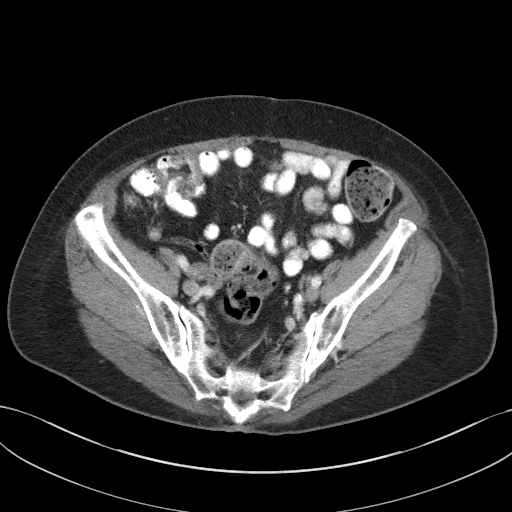
[im 37/91  soft-tissue]
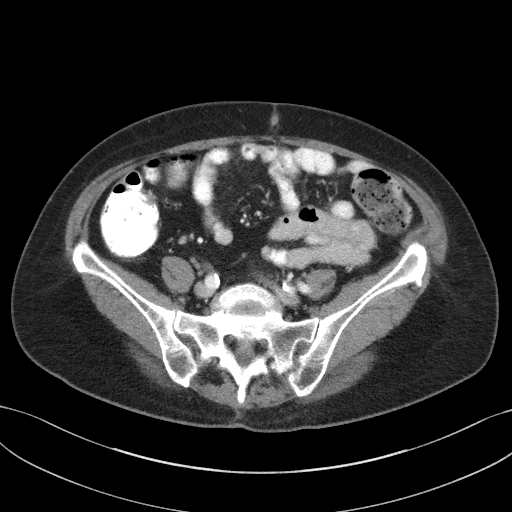
[im 49/91  soft-tissue]
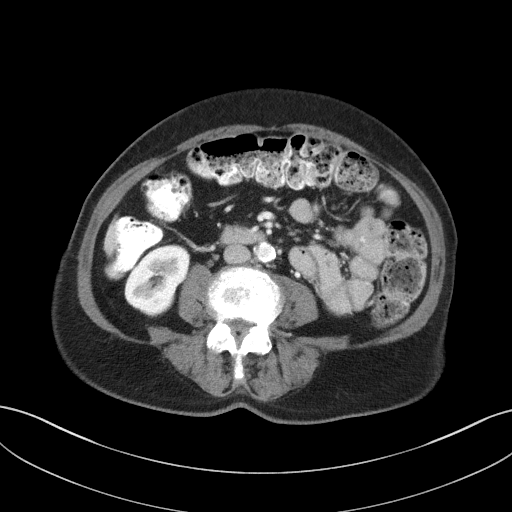
[im 55/91  soft-tissue]
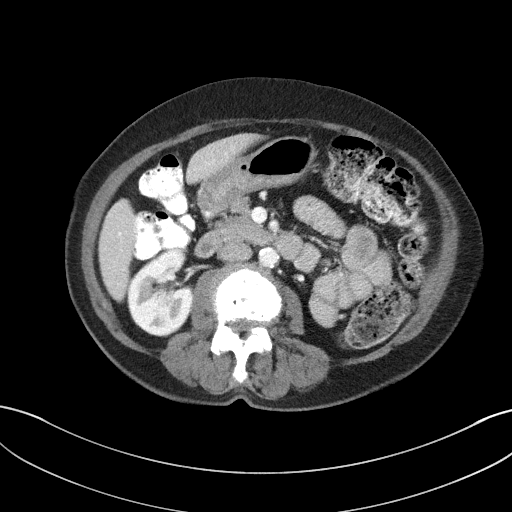
[im 61/91  soft-tissue]
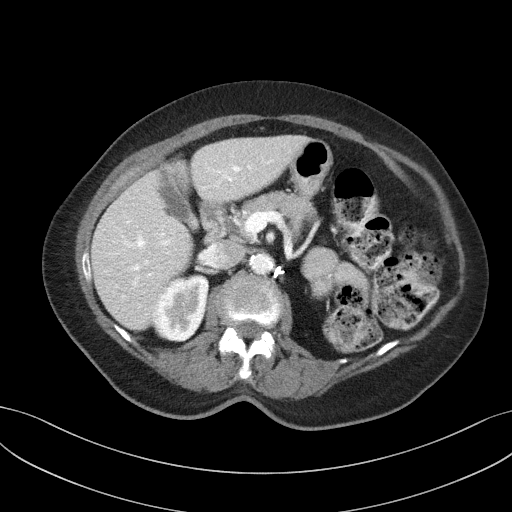
[im 61/91  bone]
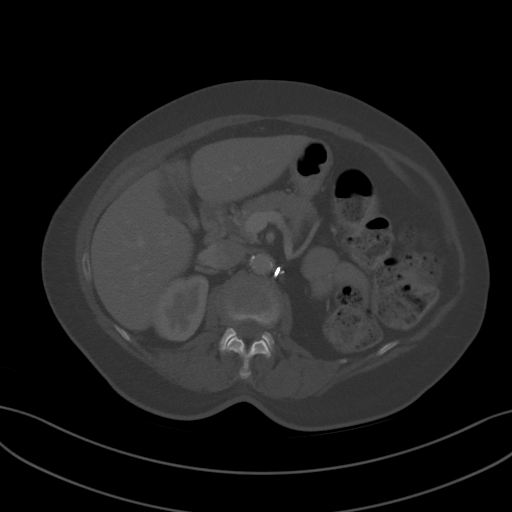
[im 67/91  soft-tissue]
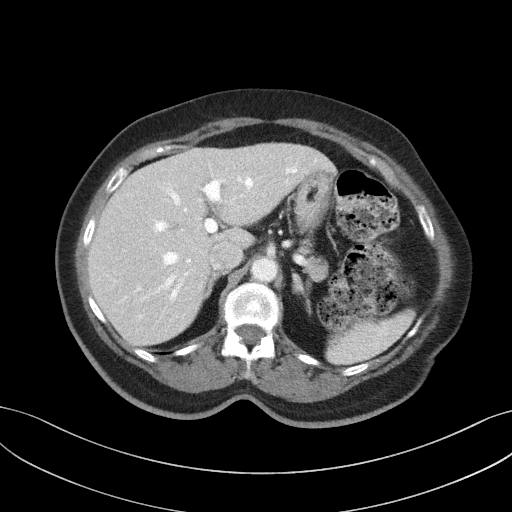
[im 73/91  soft-tissue]
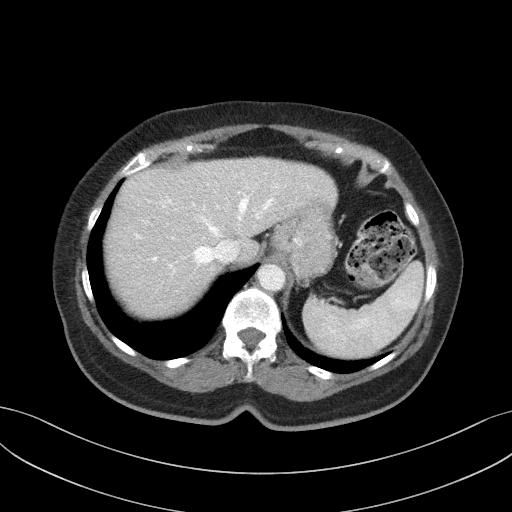
[im 79/91  soft-tissue]
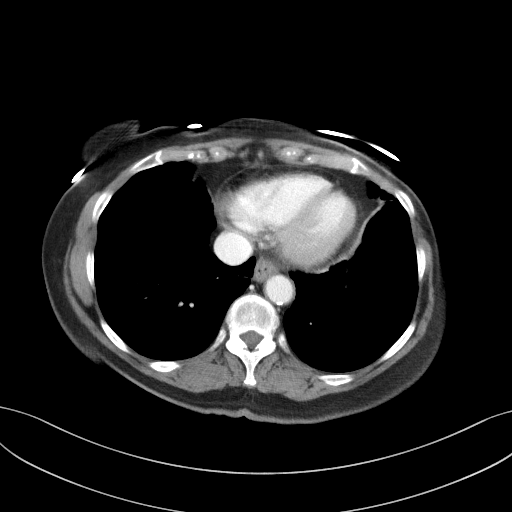
[im 85/91  soft-tissue]
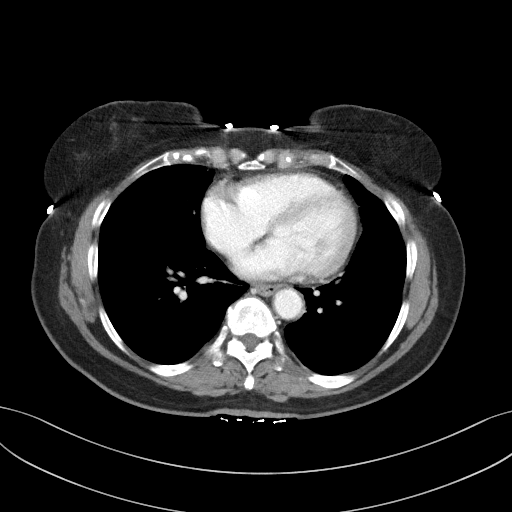

[Series 5: coronal st · coronal · 0.68mm/px · 3 of 92 slices shown]
[im 31/92  soft-tissue]
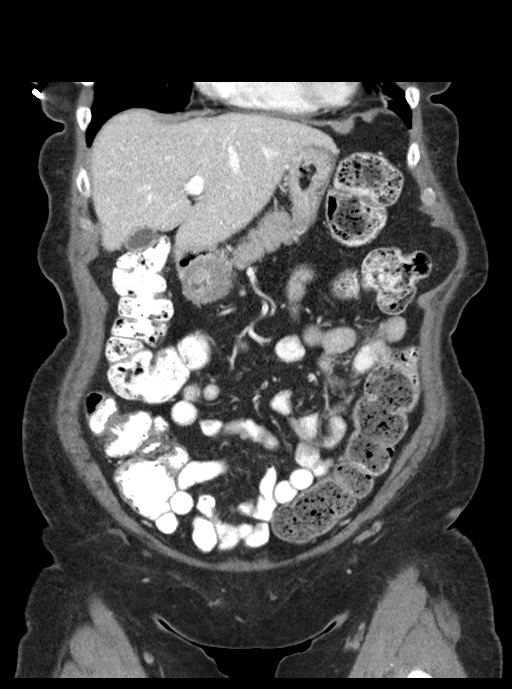
[im 41/92  soft-tissue]
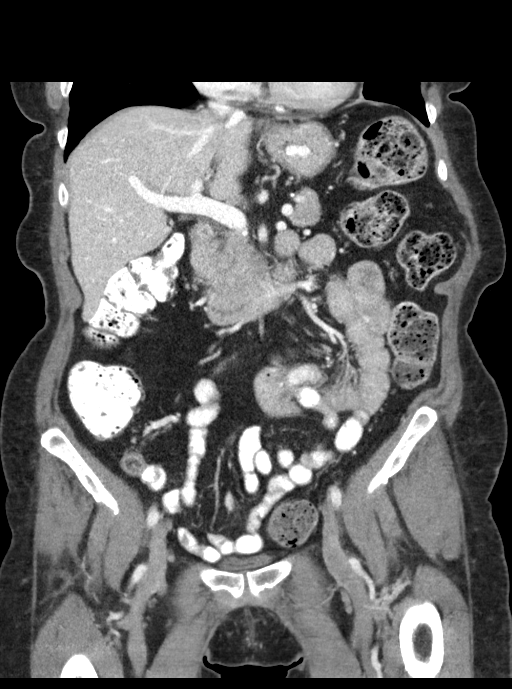
[im 51/92  soft-tissue]
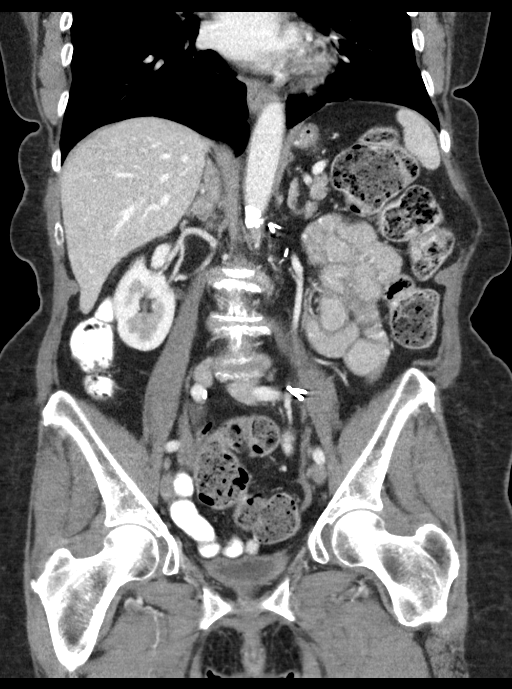

[16 of 46 positions shown; findings below may reference images not displayed]

FINDINGS: Lower Chest: No acute findings.

Hepatobiliary: No hepatic masses identified. Gallbladder is
unremarkable. No evidence of biliary ductal dilatation.

Pancreas: No evidence of pancreatic mass or ductal dilatation. No
evidence of pancreatic edema or peripancreatic inflammatory changes.
No evidence of pancreatic calcifications.

Spleen: Within normal limits in size and appearance.

Adrenals/Urinary Tract: Prior left nephrectomy. Right kidney and
adrenal glands are normal in appearance. No masses identified. No
evidence of ureteral calculi or hydronephrosis.

Stomach/Bowel: No evidence of obstruction, inflammatory process or
abnormal fluid collections. Normal appendix visualized. Large stool
burden noted throughout the colon.

Vascular/Lymphatic: No pathologically enlarged lymph nodes. No
abdominal aortic aneurysm. Aortic atherosclerotic calcification
noted.

Reproductive:  No mass or other significant abnormality.

Other:  None.

Musculoskeletal:  No suspicious bone lesions identified.
IMPRESSION: No radiographic signs of pancreatitis or other acute findings.

Prior left nephrectomy.

Large stool burden noted; recommend clinical correlation for
possible constipation.

Aortic Atherosclerosis ([id number]).

## 2022-06-27 ENCOUNTER — Ambulatory Visit: Payer: Medicare HMO | Admitting: Family Medicine

## 2022-06-28 ENCOUNTER — Ambulatory Visit (INDEPENDENT_AMBULATORY_CARE_PROVIDER_SITE_OTHER): Payer: Medicare HMO | Admitting: Family Medicine

## 2022-06-28 ENCOUNTER — Encounter: Payer: Self-pay | Admitting: Family Medicine

## 2022-06-28 VITALS — BP 130/72 | HR 72 | Temp 97.3°F | Ht 61.5 in | Wt 136.0 lb

## 2022-06-28 DIAGNOSIS — R944 Abnormal results of kidney function studies: Secondary | ICD-10-CM | POA: Diagnosis not present

## 2022-06-28 DIAGNOSIS — E538 Deficiency of other specified B group vitamins: Secondary | ICD-10-CM

## 2022-06-28 DIAGNOSIS — Z79899 Other long term (current) drug therapy: Secondary | ICD-10-CM | POA: Diagnosis not present

## 2022-06-28 DIAGNOSIS — Z23 Encounter for immunization: Secondary | ICD-10-CM | POA: Diagnosis not present

## 2022-06-28 LAB — BASIC METABOLIC PANEL
BUN: 16 mg/dL (ref 6–23)
CO2: 29 mEq/L (ref 19–32)
Calcium: 9.4 mg/dL (ref 8.4–10.5)
Chloride: 102 mEq/L (ref 96–112)
Creatinine, Ser: 1.2 mg/dL (ref 0.40–1.20)
GFR: 42.94 mL/min — ABNORMAL LOW (ref >60.00)
Glucose, Bld: 102 mg/dL — ABNORMAL HIGH (ref 70–99)
Potassium: 4.5 mEq/L (ref 3.5–5.1)
Sodium: 139 mEq/L (ref 135–145)

## 2022-06-28 LAB — VITAMIN B12: Vitamin B-12: 868 pg/mL (ref 211–911)

## 2022-06-28 NOTE — Assessment & Plan Note (Signed)
Last time 43.3 Disc imp of good fluid intake  Has only 1 kidney   Rev fluids in detail inst to avoid nsaids bmet today

## 2022-06-28 NOTE — Progress Notes (Signed)
Subjective:    Patient ID: [name], female    DOB: 1941/06/21, 81 y.o.   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) Lab Results  Component Value Date   CREATININE 1.19 04/18/2022   BUN 22 04/18/2022   NA 135 04/18/2022   K 4.7 04/18/2022   CL 99 04/18/2022   CO2 29 04/18/2022  GFR 43.3    Encouraged fluids   32 oz water  Decaf coffee - 3-4 mugs per day  Diet coke - at least 16 oz per day    No nsaids  Crystals on ua  Due for re check     HTN bp is stable today  No cp or palpitations or headaches or edema  No side effects to medicines  BP Readings from Last 3 Encounters:  06/28/22 130/72  04/26/22 112/68  04/18/22 116/78     Patient Active Problem List   Diagnosis Date Noted   Current use of proton pump inhibitor 04/18/2022   Decreased GFR 04/18/2022   Vitamin B12 deficiency 04/18/2022   Multiple gastric ulcers 04/27/2021   Effusion of right knee joint 05/31/2020   Pain in right knee 05/31/2020   Pain in joint of left shoulder 05/20/2020   Pain in right foot 04/08/2020   Closed fracture of shaft of right fibula 01/08/2020   Pain in joint of right ankle 01/08/2020   Hematoma of left breast 01/02/2020   Carpal tunnel syndrome 05/27/2018   Pain in finger of right hand 04/24/2018   Pain in joint of right foot 04/24/2018   Hyperkalemia 02/01/2018   Encounter for screening mammogram for  breast cancer 01/19/2017   Estrogen deficiency 01/19/2017   Routine general medical examination at a health care facility 06/11/2015   Encounter for Medicare annual wellness exam 11/14/2013   Dermatitis 11/14/2013   Family history of colon cancer 11/14/2013   Colon cancer screening 11/14/2013   Hypertension 01/27/2011   Osteoporosis 01/27/2011   Post-menopausal 01/27/2011   Low back pain radiating to left leg 03/13/2008   Hyperlipidemia 12/20/2007   CONSTIPATION, CHRONIC 12/20/2007   Prediabetes 12/20/2007   Past Medical History:  Diagnosis Date   Back pain    Chronic constipation    past hx- no current constipation 02-2021   Hyperlipidemia    Hypertension    Multiple gastric ulcers    Osteopenia    Single kidney    donated kidney    Past Surgical History:  Procedure Laterality Date   CLEFT PALATE REPAIR     COLONOSCOPY     FOOT NEUROMA SURGERY  2/06   NEPHRECTOMY LIVING DONOR     Left   TUBAL LIGATION     UPPER GASTROINTESTINAL ENDOSCOPY     Social History   Tobacco  Use   Smoking status: Never   Smokeless tobacco: Never  Vaping Use   Vaping Use: Never used  Substance Use Topics   Alcohol use: No   Drug use: No   Family History  Problem Relation Age of Onset   Colon cancer Mother    Diabetes Mother    Coronary artery disease Mother    Osteoporosis Mother    Dementia Mother    Coronary artery disease Father    Heart failure Father    Hypertension Father    Stomach cancer Neg Hx    Pancreatic cancer Neg Hx    Esophageal cancer Neg Hx    Liver disease Neg Hx    Colon polyps Neg Hx    Rectal cancer Neg Hx    Breast cancer Neg Hx    Allergies  Allergen Reactions   Codeine    Hydrocodone Bit-Homatrop Mbr     Caused flu-like symptoms   Current Outpatient Medications on File Prior to Visit  Medication Sig Dispense Refill   aspirin 81 MG tablet Take 81 mg by mouth daily.     atorvastatin (LIPITOR) 10 MG tablet TAKE 1 TABLET BY MOUTH EVERY DAY 90 tablet  3   Cyanocobalamin (B-12) 1000 MCG CAPS Take 1 capsule by mouth daily.     D 1000 25 MCG (1000 UT) capsule SMARTSIG:1 By Mouth     desonide (DESOWEN) 0.05 % ointment Apply 1 application. topically 2 (two) times daily.     lisinopril (ZESTRIL) 5 MG tablet Take 1 tablet (5 mg total) by mouth daily. 90 tablet 3   Misc Natural Products (RA GLUCOSAMINE-CHONDROIT-MSM-D) TABS      pantoprazole (PROTONIX) 40 MG tablet TAKE 1 TABLET BY MOUTH TWICE A DAY 180 tablet 1   No current facility-administered medications on file prior to visit.    Review of Systems  Constitutional:  Negative for activity change, appetite change, fatigue, fever and unexpected weight change.  HENT:  Negative for congestion, ear pain, rhinorrhea, sinus pressure and sore throat.   Eyes:  Negative for pain, redness and visual disturbance.  Respiratory:  Negative for cough, shortness of breath and wheezing.   Cardiovascular:  Negative for chest pain and palpitations.  Gastrointestinal:  Negative for abdominal pain, blood in stool, constipation and diarrhea.  Endocrine: Negative for polydipsia and polyuria.  Genitourinary:  Negative for dysuria, frequency and urgency.  Musculoskeletal:  Negative for arthralgias, back pain and myalgias.  Skin:  Negative for pallor and rash.  Allergic/Immunologic: Negative for environmental allergies.  Neurological:  Negative for dizziness, syncope and headaches.  Hematological:  Negative for adenopathy. Does not bruise/bleed easily.  Psychiatric/Behavioral:  Negative for decreased concentration and dysphoric mood. The patient is not nervous/anxious.        Objective:   Physical Exam Constitutional:      General: She is not in acute distress.    Appearance: Normal appearance. She is well-developed and normal weight. She is not ill-appearing or diaphoretic.  HENT:     Head: Normocephalic and atraumatic.  Eyes:     Conjunctiva/sclera: Conjunctivae normal.     Pupils: Pupils are equal, round,  and reactive to light.  Neck:     Thyroid: No thyromegaly.     Vascular: No carotid bruit or JVD.  Cardiovascular:     Rate and Rhythm: Normal rate and regular rhythm.     Heart sounds: Normal heart sounds.     No gallop.  Pulmonary:     Effort: Pulmonary effort  is normal. No respiratory distress.     Breath sounds: Normal breath sounds. No wheezing or rales.  Abdominal:     General: There is no distension or abdominal bruit.     Palpations: Abdomen is soft.  Musculoskeletal:     Cervical back: Normal range of motion and neck supple.     Right lower leg: No edema.     Left lower leg: No edema.  Lymphadenopathy:     Cervical: No cervical adenopathy.  Skin:    General: Skin is warm and dry.     Coloration: Skin is not pale.     Findings: No rash.  Neurological:     Mental Status: She is alert.     Coordination: Coordination normal.     Deep Tendon Reflexes: Reflexes are normal and symmetric. Reflexes normal.  Psychiatric:        Mood and Affect: Mood normal.           Assessment & Plan:   Problem List Items Addressed This Visit       Other   Current use of proton pump inhibitor    Takes protonix 40 mg bid  H/o PUD Unsure if/when she can cut dosing to once daily  She will check in with GI for further inst    Watching B12 level       Decreased GFR - Primary    Last time 43.3 Disc imp of good fluid intake  Has only 1 kidney   Rev fluids in detail inst to avoid nsaids bmet today       Vitamin B12 deficiency    Level was 132  Had a b 12 shot weekly for 4 weeks Taking 1000 mcg oral daily  Level today  No clinical changes       Other Visit Diagnoses     Need for influenza vaccination       Relevant Orders   Flu Vaccine QUAD High Dose(Fluad) (Completed)

## 2022-06-28 NOTE — Assessment & Plan Note (Signed)
Takes protonix 40 mg bid  H/o PUD Unsure if/when she can cut dosing to once daily  She will check in with GI for further inst    Watching B12 level

## 2022-06-28 NOTE — Assessment & Plan Note (Signed)
Level was 132  Had a b 12 shot weekly for 4 weeks Taking 1000 mcg oral daily  Level today  No clinical changes

## 2022-06-28 NOTE — Patient Instructions (Addendum)
Message your GI doctor to find out when to reduce your protonix dose  Also see if /when they want a follow up   Goal is 64 oz of fluid daily-mostly water  Keep working on it   Lab today

## 2022-07-16 ENCOUNTER — Other Ambulatory Visit: Payer: Self-pay | Admitting: Family Medicine

## 2022-07-16 ENCOUNTER — Other Ambulatory Visit: Payer: Self-pay | Admitting: Internal Medicine

## 2022-07-17 ENCOUNTER — Ambulatory Visit
Admission: EM | Admit: 2022-07-17 | Discharge: 2022-07-17 | Disposition: A | Discharge status: Home / Self Care | Payer: Medicare HMO | Attending: Urgent Care | Admitting: Urgent Care

## 2022-07-17 DIAGNOSIS — M62838 Other muscle spasm: Secondary | ICD-10-CM

## 2022-07-17 MED ORDER — PREDNISONE 50 MG PO TABS: 50.0000 mg | ORAL_TABLET | Freq: Every day | ORAL | 0 refills | Status: AC

## 2022-07-17 NOTE — ED Triage Notes (Signed)
Pt. States she has been having left shoulder pain that radiates down her left arm. Pt. Has been treating herself w/ OTC medication that does not help.

## 2022-07-17 NOTE — Discharge Instructions (Addendum)
I have prescribed a course of prednisone to treat presumed inflammation of your left shoulder causing your pain.  I would like you to use cold therapy tonight before bed.  Take 2 tablets of Aleve at bedtime (with food).  If your symptoms are improved with this treatment, you may forego using the prednisone.  If your symptoms continue more than 1 week, I recommend that you follow-up with an orthopedic provider for evaluation.

## 2022-07-17 NOTE — ED Provider Notes (Signed)
[facility]    CSN: [id number] Arrival date & time: 07/17/22  1539      History   Chief Complaint Chief Complaint  Patient presents with   Shoulder Pain    HPI [name] is a 81 y.o. female.    Shoulder Pain   Patient presents to urgent care with complaint of shooting pain in her left shoulder.  Pain starts posterior left shoulder, mid clavicle, with pain shooting down her left arm.  She denies any injuries or known precipitating event.  No history of similar symptoms.  She states her husband was recently diagnosed with shingles with post herpetic neuralgia and wonders if she also has a shingles outbreak.  She denies any rash.  Past Medical History:  Diagnosis Date   Back pain    Chronic constipation    past hx- no current constipation 02-2021   Hyperlipidemia    Hypertension    Multiple gastric ulcers    Osteopenia    Single kidney    donated kidney     Patient Active Problem List   Diagnosis Date Noted   Current use of proton pump inhibitor 04/18/2022   Decreased GFR 04/18/2022   Vitamin B12 deficiency 04/18/2022   Multiple gastric ulcers 04/27/2021   Effusion of right knee joint 05/31/2020   Pain in right knee 05/31/2020   Pain in joint of left shoulder 05/20/2020   Pain in right foot 04/08/2020   Closed fracture of shaft of right fibula 01/08/2020   Pain in joint of right ankle 01/08/2020   Hematoma of left breast 01/02/2020   Carpal tunnel syndrome 05/27/2018   Pain in finger of right hand 04/24/2018   Pain in joint of right foot 04/24/2018   Hyperkalemia 02/01/2018   Encounter for screening mammogram for breast cancer 01/19/2017   Estrogen deficiency 01/19/2017   Routine general medical examination at a health care facility 06/11/2015   Encounter for Medicare annual wellness exam 11/14/2013   Dermatitis 11/14/2013   Family history of colon cancer 11/14/2013   Colon cancer screening 11/14/2013   Hypertension 01/27/2011    Osteoporosis 01/27/2011   Post-menopausal 01/27/2011   Low back pain radiating to left leg 03/13/2008   Hyperlipidemia 12/20/2007   CONSTIPATION, CHRONIC 12/20/2007   Prediabetes 12/20/2007    Past Surgical History:  Procedure Laterality Date   CLEFT PALATE REPAIR     COLONOSCOPY     FOOT NEUROMA SURGERY  2/06   NEPHRECTOMY LIVING DONOR     Left   TUBAL LIGATION     UPPER GASTROINTESTINAL ENDOSCOPY      OB History   No obstetric history on file.      Home Medications    Prior to Admission medications   Medication Sig Start Date End Date Taking? Authorizing Provider  aspirin 81 MG tablet Take 81 mg by mouth daily.    [provider]  atorvastatin (LIPITOR) 10 MG tablet TAKE 1 TABLET BY MOUTH EVERY DAY 05/08/22   Tower, Wynelle Fanny, MD  Cyanocobalamin (B-12) 1000 MCG CAPS Take 1 capsule by mouth daily.    [provider]  D 1000 25 MCG (1000 UT) capsule SMARTSIG:1 By Mouth 10/27/21   [provider]  desonide (DESOWEN) 0.05 % ointment Apply 1 application. topically 2 (two) times daily. 10/27/21   [provider]  lisinopril (ZESTRIL) 5 MG tablet TAKE 1 TABLET (5 MG TOTAL) BY MOUTH DAILY. 07/17/22   Tower, Wynelle Fanny, MD  Misc Natural Products (RA  GLUCOSAMINE-CHONDROIT-MSM-D) TABS  10/27/21   [provider]  pantoprazole (PROTONIX) 40 MG tablet TAKE 1 TABLET BY MOUTH TWICE A DAY 01/20/22   Irene Shipper, MD    Family History Family History  Problem Relation Age of Onset   Colon cancer Mother    Diabetes Mother    Coronary artery disease Mother    Osteoporosis Mother    Dementia Mother    Coronary artery disease Father    Heart failure Father    Hypertension Father    Stomach cancer Neg Hx    Pancreatic cancer Neg Hx    Esophageal cancer Neg Hx    Liver disease Neg Hx    Colon polyps Neg Hx    Rectal cancer Neg Hx    Breast cancer Neg Hx     Social History Social History   Tobacco Use   Smoking status: Never   Smokeless  tobacco: Never  Vaping Use   Vaping Use: Never used  Substance Use Topics   Alcohol use: No   Drug use: No     Allergies   Codeine and Hydrocodone bit-homatrop mbr   Review of Systems Review of Systems   Physical Exam Triage Vital Signs ED Triage Vitals  Enc Vitals Group     BP 07/17/22 1616 (!) 147/77     Pulse Rate 07/17/22 1616 81     Resp 07/17/22 1616 16     Temp 07/17/22 1616 98.3 F (36.8 C)     Temp Source 07/17/22 1616 Oral     SpO2 07/17/22 1616 97 %     Weight --      Height --      Head Circumference --      Peak Flow --      Pain Score 07/17/22 1626 8     Pain Loc --      Pain Edu? --      Excl. in [place]? --    No data found.  Updated Vital Signs BP (!) 147/77 (BP Location: Right Arm)   Pulse 81   Temp 98.3 F (36.8 C) (Oral)   Resp 16   SpO2 97%   Visual Acuity Right Eye Distance:   Left Eye Distance:   Bilateral Distance:    Right Eye Near:   Left Eye Near:    Bilateral Near:     Physical Exam Vitals reviewed.  Constitutional:      Appearance: Normal appearance.  Musculoskeletal:     Right shoulder: No tenderness or bony tenderness. Normal range of motion.  Skin:    General: Skin is warm and dry.  Neurological:     General: No focal deficit present.     Mental Status: She is alert and oriented to person, place, and time.  Psychiatric:        Mood and Affect: Mood normal.        Behavior: Behavior normal.      UC Treatments / Results  Labs (all labs ordered are listed, but only abnormal results are displayed) Labs Reviewed - No data to display  EKG   Radiology No results found.  Procedures Procedures (including critical care time)  Medications Ordered in UC Medications - No data to display  Initial Impression / Assessment and Plan / UC Course  I have reviewed the triage vital signs and the nursing notes.  Pertinent labs & imaging results that were available during my care of the patient were reviewed by me and  considered  in my medical decision making (see chart for details).   Discussed treatment options with the patient including risk of side effects.  Agreed to provide a course of prednisone to treat presumed inflammation in the shoulder.  Asked her to use cold therapy on her shoulder tonight along with Aleve (naproxen sodium) at bedtime.  If this gives her adequate relief she will forego the treatment with prednisone and continue Aleve twice a day. If symptoms do not significantly improve after 1 week, she will seek treatment at orthopedics.   Final Clinical Impressions(s) / UC Diagnoses   Final diagnoses:  None   Discharge Instructions   None    ED Prescriptions   None    PDMP not reviewed this encounter.   Rose Phi, [place] 07/17/22 1645

## 2022-07-26 ENCOUNTER — Ambulatory Visit (INDEPENDENT_AMBULATORY_CARE_PROVIDER_SITE_OTHER): Payer: Medicare HMO | Admitting: Family Medicine

## 2022-07-26 ENCOUNTER — Encounter: Payer: Self-pay | Admitting: Family Medicine

## 2022-07-26 ENCOUNTER — Ambulatory Visit (INDEPENDENT_AMBULATORY_CARE_PROVIDER_SITE_OTHER)
Admission: RE | Admit: 2022-07-26 | Discharge: 2022-07-26 | Disposition: A | Discharge status: Home / Self Care | Payer: Medicare HMO | Source: Ambulatory Visit | Attending: Family Medicine | Admitting: Family Medicine

## 2022-07-26 VITALS — BP 132/70 | HR 89 | Temp 98.4°F | Ht 61.5 in | Wt 139.2 lb

## 2022-07-26 DIAGNOSIS — M25512 Pain in left shoulder: Secondary | ICD-10-CM

## 2022-07-26 DIAGNOSIS — M19012 Primary osteoarthritis, left shoulder: Secondary | ICD-10-CM | POA: Diagnosis not present

## 2022-07-26 DIAGNOSIS — M542 Cervicalgia: Secondary | ICD-10-CM

## 2022-07-26 DIAGNOSIS — I700 Atherosclerosis of aorta: Secondary | ICD-10-CM | POA: Diagnosis not present

## 2022-07-26 MED ORDER — PREDNISONE 20 MG PO TABS: ORAL_TABLET | ORAL | 0 refills | Status: DC

## 2022-07-26 NOTE — Progress Notes (Signed)
Valerie Coronado T. Marajade Lei, MD, [facility] [place], [address]  Phone: [phone]  FAX: [phone]  [name] - 81 y.o. female  MRN [id number]  Date of Birth: 1941/08/07  Date: 07/26/2022  PCP: Abner Greenspan, MD  Referral: Abner Greenspan, MD  Chief Complaint  Patient presents with   Shoulder Pain    Left-Seen at [facility] on 07/17/22   Subjective:   [name] is a 81 y.o. very pleasant female patient with Body mass index is 25.88 kg/m. who presents with the following:  She is here for some left shoulder pain.  Her shoulder pain was actually so severe that she went to the emergency room on July 17, 2022.  She is here today for follow-up.  Upper trap - goes all the way down her L arm, really on secondary questioning this goes down only to the level of the elbow.  She does not describe any burning or tingling.  She does not have any focal weakness or numbness. Aleve will help a little bit.   Will not go away.  Has been ongoing for two or three weeks.  Heat will help some and ice.  There most of the time.  Aleve will help.  She does also have a deep dull ache in the shoulder and pain at the nape of the neck.  She has some pain in the upper trapezius region on the left side, as well.  She does not have any history of major neck or shoulder problems.  No history of traumatic injury or fracture or operative intervention in the affected joint.  Review of Systems is noted in the HPI, as appropriate  Objective:   BP 132/70   Pulse 89   Temp 98.4 F (36.9 C) (Oral)   Ht 5' 1.5" (1.562 m)   Wt 139 lb 4 oz (63.2 kg)   SpO2 98%   BMI 25.88 kg/m   GEN: No acute distress; alert,appropriate. PULM: Breathing comfortably in no respiratory distress PSYCH: Normally interactive.   She does have palpable and audible crepitus with movement at the shoulder.  Range of motion at the neck and the shoulder is  full.  Some pain with terminal motion at the neck. There is no inducible neuropathy or radiculopathy with head motion including a negative Spurling's exam.  Strength in all directions of the shoulder is 5/5 Negative Jobe test She does have some mild pain with crossover. Mild pain with Neer and Michel Bickers testing.  Laboratory and Imaging Data:  Assessment and Plan:     ICD-10-CM   1. Glenohumeral arthritis, left  M19.012     2. Acute pain of left shoulder  M25.512 DG Shoulder Left    3. Cervicalgia  M54.2      Acute on chronic glenohumeral osteoarthritis with exacerbation combined with neck pain.  Think that the neck is primarily driving the posterior shoulder blade and trapezius pain, but she certainly does have some glenohumeral arthritis blurt this palpable and audible along with glenohumeral arthritis on the plain x-ray.  We will give her a course of some oral steroids to take for 10 days.  She also has a calcified area of this and ring structure on her plain x-ray in the chest, and I am going to have to have radiology evaluate this to make any kind of comment on it.  Orders placed today for conditions managed today: Orders Placed This  Encounter  Procedures   DG Shoulder Left    Disposition: No follow-ups on file.  [employer] speech-to-text software was used for transcription in this dictation.  Possible transcriptional errors can occur using Editor, commissioning.   Signed,  Maud Deed. Marc Sivertsen, MD   Outpatient Encounter Medications as of 07/26/2022  Medication Sig   aspirin 81 MG tablet Take 81 mg by mouth daily.   atorvastatin (LIPITOR) 10 MG tablet TAKE 1 TABLET BY MOUTH EVERY DAY   Cyanocobalamin (B-12) 1000 MCG CAPS Take 1 capsule by mouth daily.   D 1000 25 MCG (1000 UT) capsule SMARTSIG:1 By Mouth   desonide (DESOWEN) 0.05 % ointment Apply 1 application. topically 2 (two) times daily.   lisinopril (ZESTRIL) 5 MG tablet TAKE 1 TABLET (5 MG TOTAL) BY  MOUTH DAILY.   Misc Natural Products (RA GLUCOSAMINE-CHONDROIT-MSM-D) TABS    pantoprazole (PROTONIX) 40 MG tablet TAKE 1 TABLET BY MOUTH TWICE A DAY   predniSONE (DELTASONE) 20 MG tablet 2 tabs po daily for 5 days, then 1 tab po daily for 5 days   No facility-administered encounter medications on file as of 07/26/2022.

## 2022-08-15 ENCOUNTER — Telehealth: Payer: Self-pay | Admitting: Family Medicine

## 2022-08-15 DIAGNOSIS — I10 Essential (primary) hypertension: Secondary | ICD-10-CM

## 2022-08-15 DIAGNOSIS — E538 Deficiency of other specified B group vitamins: Secondary | ICD-10-CM

## 2022-08-15 DIAGNOSIS — R944 Abnormal results of kidney function studies: Secondary | ICD-10-CM

## 2022-08-15 NOTE — Telephone Encounter (Signed)
-----   Message from Ellamae Sia sent at 08/04/2022 10:40 AM EDT ----- Regarding: Lab orders for Wednesday, 11.8.23 Lab orders, thanks

## 2022-08-16 ENCOUNTER — Other Ambulatory Visit (INDEPENDENT_AMBULATORY_CARE_PROVIDER_SITE_OTHER): Payer: Medicare HMO

## 2022-08-16 DIAGNOSIS — R944 Abnormal results of kidney function studies: Secondary | ICD-10-CM

## 2022-08-16 DIAGNOSIS — E538 Deficiency of other specified B group vitamins: Secondary | ICD-10-CM | POA: Diagnosis not present

## 2022-08-16 LAB — BASIC METABOLIC PANEL
BUN: 14 mg/dL (ref 6–23)
CO2: 29 mEq/L (ref 19–32)
Calcium: 9.2 mg/dL (ref 8.4–10.5)
Chloride: 103 mEq/L (ref 96–112)
Creatinine, Ser: 1.17 mg/dL (ref 0.40–1.20)
GFR: 44.22 mL/min — ABNORMAL LOW (ref >60.00)
Glucose, Bld: 107 mg/dL — ABNORMAL HIGH (ref 70–99)
Potassium: 4.7 mEq/L (ref 3.5–5.1)
Sodium: 138 mEq/L (ref 135–145)

## 2022-08-16 LAB — VITAMIN B12: Vitamin B-12: 1164 pg/mL — ABNORMAL HIGH (ref 211–911)

## 2022-09-14 ENCOUNTER — Other Ambulatory Visit: Payer: Self-pay

## 2022-09-14 ENCOUNTER — Emergency Department (HOSPITAL_BASED_OUTPATIENT_CLINIC_OR_DEPARTMENT_OTHER)
Admission: EM | Admit: 2022-09-14 | Discharge: 2022-09-14 | Disposition: A | Discharge status: Home / Self Care | Payer: Medicare HMO | Attending: Emergency Medicine | Admitting: Emergency Medicine

## 2022-09-14 ENCOUNTER — Emergency Department (HOSPITAL_BASED_OUTPATIENT_CLINIC_OR_DEPARTMENT_OTHER): Payer: Medicare HMO | Admitting: Radiology

## 2022-09-14 ENCOUNTER — Encounter (HOSPITAL_BASED_OUTPATIENT_CLINIC_OR_DEPARTMENT_OTHER): Payer: Self-pay | Admitting: Emergency Medicine

## 2022-09-14 ENCOUNTER — Encounter: Payer: Self-pay | Admitting: Family Medicine

## 2022-09-14 DIAGNOSIS — Z79899 Other long term (current) drug therapy: Secondary | ICD-10-CM | POA: Diagnosis not present

## 2022-09-14 DIAGNOSIS — I10 Essential (primary) hypertension: Secondary | ICD-10-CM | POA: Insufficient documentation

## 2022-09-14 DIAGNOSIS — M5441 Lumbago with sciatica, right side: Secondary | ICD-10-CM | POA: Diagnosis not present

## 2022-09-14 DIAGNOSIS — M545 Low back pain, unspecified: Secondary | ICD-10-CM | POA: Diagnosis not present

## 2022-09-14 DIAGNOSIS — M5431 Sciatica, right side: Secondary | ICD-10-CM | POA: Insufficient documentation

## 2022-09-14 DIAGNOSIS — Z7982 Long term (current) use of aspirin: Secondary | ICD-10-CM | POA: Insufficient documentation

## 2022-09-14 DIAGNOSIS — M5136 Other intervertebral disc degeneration, lumbar region: Secondary | ICD-10-CM | POA: Diagnosis not present

## 2022-09-14 MED ORDER — OXYCODONE-ACETAMINOPHEN 5-325 MG PO TABS: 1.0000 | ORAL_TABLET | Freq: Four times a day (QID) | ORAL | 0 refills | Status: DC | PRN

## 2022-09-14 MED ORDER — LIDOCAINE 5 % EX PTCH: 1.0000 | MEDICATED_PATCH | CUTANEOUS | 0 refills | Status: DC

## 2022-09-14 MED ORDER — NAPROXEN 375 MG PO TABS: 375.0000 mg | ORAL_TABLET | Freq: Two times a day (BID) | ORAL | 0 refills | Status: DC

## 2022-09-14 MED ORDER — PREDNISONE 20 MG PO TABS: 20.0000 mg | ORAL_TABLET | Freq: Two times a day (BID) | ORAL | 0 refills | Status: AC

## 2022-09-14 MED ORDER — HYDROMORPHONE HCL 1 MG/ML IJ SOLN
0.5000 mg | Freq: Once | INTRAMUSCULAR | Status: AC
  Administered 2022-09-14: 0.5 mg via INTRAVENOUS
  Filled 2022-09-14: qty 1

## 2022-09-14 NOTE — Discharge Instructions (Addendum)
Take the medications as needed for pain.  Follow-up with your doctor to discuss further treatment if your symptoms persist.

## 2022-09-14 NOTE — ED Triage Notes (Signed)
Pt has had several days of lower back pain /shooting pain to her right hip/leg.

## 2022-09-14 NOTE — ED Provider Notes (Signed)
[facility] EMERGENCY DEPT Provider Note   CSN: [id number] Arrival date & time: 09/14/22  1221     History  Chief Complaint  Patient presents with   Back Pain    [name] is a 81 y.o. female.   Back Pain    Patient presents to the ER for evaluation of lower back pain patient does have history of osteopenia, hyperlipidemia, hypertension.  Patient states she has some mild intermittent episodes of back pain but nothing severe.  However in the last few days she has had sharp pain in the lower back that radiates down her right leg.  It increases with certain movements and positions.  She denies any trouble with fevers or chills.  No numbness or weakness.  No abdominal pain.  Urinary symptoms.  Home Medications Prior to Admission medications   Medication Sig Start Date End Date Taking? Authorizing Provider  lidocaine (LIDODERM) 5 % Place 1 patch onto the skin daily. Remove & Discard patch within 12 hours or as directed by MD 09/14/22  Yes Dorie Rank, MD  oxyCODONE-acetaminophen (PERCOCET/ROXICET) 5-325 MG tablet Take 1 tablet by mouth every 6 (six) hours as needed for severe pain. 09/14/22  Yes Dorie Rank, MD  predniSONE (DELTASONE) 20 MG tablet Take 1 tablet (20 mg total) by mouth 2 (two) times daily with a meal for 5 days. 09/14/22 09/19/22 Yes Dorie Rank, MD  aspirin 81 MG tablet Take 81 mg by mouth daily.    [provider]  atorvastatin (LIPITOR) 10 MG tablet TAKE 1 TABLET BY MOUTH EVERY DAY 05/08/22   Tower, Wynelle Fanny, MD  Cyanocobalamin (B-12) 1000 MCG CAPS Take 1 capsule by mouth daily.    [provider]  D 1000 25 MCG (1000 UT) capsule SMARTSIG:1 By Mouth 10/27/21   [provider]  desonide (DESOWEN) 0.05 % ointment Apply 1 application. topically 2 (two) times daily. 10/27/21   [provider]  lisinopril (ZESTRIL) 5 MG tablet TAKE 1 TABLET (5 MG TOTAL) BY MOUTH DAILY. 07/17/22   Tower, Wynelle Fanny, MD  Misc Natural Products (RA  GLUCOSAMINE-CHONDROIT-MSM-D) TABS  10/27/21   [provider]  pantoprazole (PROTONIX) 40 MG tablet TAKE 1 TABLET BY MOUTH TWICE A DAY 07/17/22   Irene Shipper, MD      Allergies    Codeine and Hydrocodone bit-homatrop mbr    Review of Systems   Review of Systems  Musculoskeletal:  Positive for back pain.    Physical Exam Updated Vital Signs BP 136/80   Pulse 93   Temp 99.3 F (37.4 C) (Oral)   Resp 16   SpO2 97%  Physical Exam Vitals and nursing note reviewed.  Constitutional:      General: She is not in acute distress.    Appearance: She is well-developed.  HENT:     Head: Normocephalic and atraumatic.     Right Ear: External ear normal.     Left Ear: External ear normal.  Eyes:     General: No scleral icterus.       Right eye: No discharge.        Left eye: No discharge.     Conjunctiva/sclera: Conjunctivae normal.  Neck:     Trachea: No tracheal deviation.  Cardiovascular:     Rate and Rhythm: Normal rate.  Pulmonary:     Effort: Pulmonary effort is normal. No respiratory distress.     Breath sounds: No stridor.  Abdominal:     General: There is no  distension.     Tenderness: There is no abdominal tenderness.  Musculoskeletal:        General: No swelling or deformity.     Cervical back: Neck supple.     Lumbar back: Spasms and tenderness present.  Skin:    General: Skin is warm and dry.     Findings: No rash.  Neurological:     Mental Status: She is alert.     Cranial Nerves: Cranial nerve deficit: no gross deficits.     Comments: Sensation intact bilateral lower extremities, 5 out of 5 plantar dorsiflexion strength     ED Results / Procedures / Treatments   Labs (all labs ordered are listed, but only abnormal results are displayed) Labs Reviewed - No data to display  EKG None  Radiology DG Lumbar Spine Complete  Result Date: 09/14/2022 CLINICAL DATA:  Several day history of lower back pain radiating into the right hip/leg EXAM: LUMBAR  SPINE - COMPLETE 5 VIEW COMPARISON:  CT abdomen and pelvis dated 01/04/2021 FINDINGS: Surgical clips project over the medial left hemiabdomen at the level of L1-2 and S5. There is no evidence of lumbar spine fracture. Straightening of the lumbar lordosis. Multilevel degenerative changes of the lumbar spine, most pronounced at L2-3, L3-4, and L4-5. Vascular calcifications. IMPRESSION: Multilevel degenerative changes of the lumbar spine, most pronounced at L2-3, L3-4, and L4-5. Electronically Signed   By: Darrin Nipper M.D.   On: 09/14/2022 16:44    Procedures Procedures    Medications Ordered in ED Medications  HYDROmorphone (DILAUDID) injection 0.5 mg (0.5 mg Intravenous Given 09/14/22 1650)    ED Course/ Medical Decision Making/ A&P Clinical Course as of 09/14/22 1747  Thu Sep 14, 2022  1747 X-rays show evidence of degenerative changes but no acute abnormality [JK]  1747 Patient states she is feeling better after pain medications [JK]    Clinical Course User Index [JK] Dorie Rank, MD                           Medical Decision Making Problems Addressed: Sciatica of right side: acute illness or injury that poses a threat to life or bodily functions  Amount and/or Complexity of Data Reviewed Radiology: ordered and independent interpretation performed.  Risk Prescription drug management.   Patient presented to ED with complaints of back pain.  Exam suggestive of sciatica.  Fortunately no signs of any acute neurologic deficits.  No findings to suggest acute infection.  No abdominal tenderness to suggest referred pain.  Patient was treated with pain medications.  Will discharge home with a course of steroids pain medications and lidocaine patch.  Discussed outpatient follow-up with PCP.         Final Clinical Impression(s) / ED Diagnoses Final diagnoses:  Sciatica of right side    Rx / DC Orders ED Discharge Orders          Ordered    lidocaine (LIDODERM) 5 %  Every 24 hours         09/14/22 1745    oxyCODONE-acetaminophen (PERCOCET/ROXICET) 5-325 MG tablet  Every 6 hours PRN        09/14/22 1745    naproxen (NAPROSYN) 375 MG tablet  2 times daily,   Status:  Discontinued        09/14/22 1745    predniSONE (DELTASONE) 20 MG tablet  2 times daily with meals        09/14/22 1746  Dorie Rank, MD 09/14/22 [phone]

## 2022-09-15 NOTE — Telephone Encounter (Signed)
It looks like she was seen for sciatica.  Was tx with oxycodone and prednisone and nsaid and lidoderm.  I am worried that her pain is still 10/10. Is she having any neuro deficits (weakness, numbness or loss of bowel or bladder control)?  Is she still vomiting?

## 2022-09-15 NOTE — Telephone Encounter (Signed)
Called Patient she states no numbness, weakness or loss of bowel or bladder control. Patient states yesterday she could not keep anything down but today she has been able to keep her medications and a half cup of oatmeal and a little broth and ginger ale down. Patient states she has an appointment with Frederico Hamman Copland on Monday at 8:40.

## 2022-09-15 NOTE — Telephone Encounter (Signed)
Glad she is doing a bit better Will cc Dr Lorelei Pont

## 2022-09-17 NOTE — Progress Notes (Unsigned)
    Griff Badley T. Oakes Mccready, MD, [place] at [facility] [place], [address]  Phone: [phone]  FAX: [phone]  [name] - 81 y.o. female  MRN [id number]  Date of Birth: [birth year]  Date: 09/18/2022  PCP: Abner Greenspan, MD  Referral: Abner Greenspan, MD  No chief complaint on file.  Subjective:   [name] is a 81 y.o. very pleasant female patient with There is no height or weight on file to calculate BMI. who presents with the following:  She is a very pleasant young lady, and it looks as if she has had some chronic low back pain going on back for years where she has seen various other orthopedic providers regarding this.  She had some severe back pain and radicular pain down the left side and she went to the emergency room at [facility] at the end of last week.  I did place her on some prednisone, she is here today to follow-up.    Review of Systems is noted in the HPI, as appropriate  Objective:   There were no vitals taken for this visit.  GEN: No acute distress; alert,appropriate. PULM: Breathing comfortably in no respiratory distress PSYCH: Normally interactive.   Laboratory and Imaging Data:  Assessment and Plan:   ***

## 2022-09-18 ENCOUNTER — Encounter: Payer: Self-pay | Admitting: Family Medicine

## 2022-09-18 ENCOUNTER — Ambulatory Visit (INDEPENDENT_AMBULATORY_CARE_PROVIDER_SITE_OTHER): Payer: Medicare HMO | Admitting: Family Medicine

## 2022-09-18 VITALS — BP 160/72 | HR 92 | Temp 98.9°F | Ht 61.5 in | Wt 135.0 lb

## 2022-09-18 DIAGNOSIS — M5137 Other intervertebral disc degeneration, lumbosacral region: Secondary | ICD-10-CM | POA: Diagnosis not present

## 2022-09-20 ENCOUNTER — Telehealth: Payer: Self-pay

## 2022-09-20 NOTE — Chronic Care Management (AMB) (Addendum)
    Chronic Care Management Pharmacy Assistant   Name: [name]  MRN: [id number] DOB: April 03, 1941  Reason for Encounter: Hospital Follow Up Non CCM    Medications: Outpatient Encounter Medications as of 09/20/2022  Medication Sig   aspirin 81 MG tablet Take 81 mg by mouth daily.   atorvastatin (LIPITOR) 10 MG tablet TAKE 1 TABLET BY MOUTH EVERY DAY   Cyanocobalamin (B-12) 1000 MCG CAPS Take 1 capsule by mouth daily.   D 1000 25 MCG (1000 UT) capsule SMARTSIG:1 By Mouth   desonide (DESOWEN) 0.05 % ointment Apply 1 application. topically 2 (two) times daily.   lidocaine (LIDODERM) 5 % Place 1 patch onto the skin daily. Remove & Discard patch within 12 hours or as directed by MD   lisinopril (ZESTRIL) 5 MG tablet TAKE 1 TABLET (5 MG TOTAL) BY MOUTH DAILY.   Misc Natural Products (RA GLUCOSAMINE-CHONDROIT-MSM-D) TABS    oxyCODONE-acetaminophen (PERCOCET/ROXICET) 5-325 MG tablet Take 1 tablet by mouth every 6 (six) hours as needed for severe pain.   pantoprazole (PROTONIX) 40 MG tablet TAKE 1 TABLET BY MOUTH TWICE A DAY   No facility-administered encounter medications on file as of 09/20/2022.    Reviewed hospital notes for details of recent visit. Has patient been contacted by Transitions of Care team? No Has patient seen PCP/specialist for hospital follow up (summarize OV if yes): Yes I did pull up her plain x-rays and we reviewed them face-to-face in the office today.  Thankfully, she is already improving quite a bit since the end of the week last week. She is on 5-day courses of prednisone, suspect she will have problems off an don indefinitely-f/u as needed  Admitted to the ED on 09/14/22. Discharge date was 09/14/22.  Discharged from [facility] ED .   Discharge diagnosis (Principal Problem): sciatica right side  Patient was discharged to Home  Brief summary of hospital course:  Patient presented to ED with complaints of back pain.  Exam suggestive of sciatica.   Fortunately no signs of any acute neurologic deficits.  No findings to suggest acute infection.  No abdominal tenderness to suggest referred pain.  Patient was treated with pain medications.  Will discharge home with a course of steroids pain medications and lidocaine patch.  Discussed outpatient follow-up with PCP.   New?Medications Started at [facility] Discharge:?? -Started Lidocaine 5% patch Oxycodone 5/325 Prednisone '20mg'$     Medications that remain the same after Hospital Discharge:??  -All other medications will remain the same.    Next CCM appt: none  Other upcoming appts: PCP appointment on 09/18/22  Charlene Brooke, PharmD notified and will determine if action is needed.  Avel Sensor, [place]  [phone]  Pharmacist addendum: Reviewed notes. Pt has seen PCP for follow up and was improving. No PharmD intervention warranted.  Charlene Brooke, PharmD, BCACP 10/06/22 11:04 AM

## 2022-10-10 ENCOUNTER — Ambulatory Visit (INDEPENDENT_AMBULATORY_CARE_PROVIDER_SITE_OTHER): Payer: Medicare HMO | Admitting: Family Medicine

## 2022-10-10 ENCOUNTER — Encounter: Payer: Self-pay | Admitting: Family Medicine

## 2022-10-10 ENCOUNTER — Ambulatory Visit (INDEPENDENT_AMBULATORY_CARE_PROVIDER_SITE_OTHER)
Admission: RE | Admit: 2022-10-10 | Discharge: 2022-10-10 | Disposition: A | Discharge status: Home / Self Care | Payer: Medicare HMO | Source: Ambulatory Visit | Attending: Family Medicine | Admitting: Family Medicine

## 2022-10-10 VITALS — BP 134/68 | HR 104 | Temp 97.3°F | Ht 61.5 in | Wt 133.1 lb

## 2022-10-10 DIAGNOSIS — M545 Low back pain, unspecified: Secondary | ICD-10-CM

## 2022-10-10 DIAGNOSIS — M79605 Pain in left leg: Secondary | ICD-10-CM

## 2022-10-10 DIAGNOSIS — R1031 Right lower quadrant pain: Secondary | ICD-10-CM

## 2022-10-10 DIAGNOSIS — M25551 Pain in right hip: Secondary | ICD-10-CM | POA: Diagnosis not present

## 2022-10-10 NOTE — Progress Notes (Signed)
Subjective:    Patient ID: [name], female    DOB: [birth year], 82 y.o.   MRN: [id number]  HPI Pt presents to discuss chronic back pain and R groin pain   Wt Readings from Last 3 Encounters:  10/10/22 133 lb 2 oz (60.4 kg)  09/18/22 135 lb (61.2 kg)  07/26/22 139 lb 4 oz (63.2 kg)   24.75 kg/m  Vitals:   10/10/22 1012  BP: 134/68  Pulse: (!) 104  Temp: (!) 97.3 F (36.3 C)  TempSrc: Temporal  SpO2: 98%  Weight: 133 lb 2 oz (60.4 kg)  Height: 5' 1.5" (1.562 m)    Saw Dr Lorelei Pont 09/18/22- sport med   Most of the pain right now is in R groin area  2 weeks   Back pain is primarily R upper buttock  - constant and dull and tolerable   Pain will just hit out of nowhere  Like a spasm   With walking she does a bit better  Trying to move and be as active as you can   Has to take breaks when working   No numbness or tingling , no weakness ,no foot drop  No sudden loss of b/b control   Has never broken a hip  Did break leg -distal fib- prolonged healing but no surgery /on the R     Last ER visit- went to [facility] and placed on prednisone which helped    Imaging Narrative & Impression  CLINICAL DATA:  Several day history of lower back pain radiating into the right hip/leg   EXAM: LUMBAR SPINE - COMPLETE 5 VIEW   COMPARISON:  CT abdomen and pelvis dated 01/04/2021   FINDINGS: Surgical clips project over the medial left hemiabdomen at the level of L1-2 and S5. There is no evidence of lumbar spine fracture. Straightening of the lumbar lordosis. Multilevel degenerative changes of the lumbar spine, most pronounced at L2-3, L3-4, and L4-5. Vascular calcifications.   IMPRESSION: Multilevel degenerative changes of the lumbar spine, most pronounced at L2-3, L3-4, and L4-5.     Otc: Aleve back and joint pain  Lidoderm patch  Heat more than ice    No PT recently   Patient Active Problem List   Diagnosis Date Noted   Right groin pain 10/10/2022    Current use of proton pump inhibitor 04/18/2022   Decreased GFR 04/18/2022   Vitamin B12 deficiency 04/18/2022   Multiple gastric ulcers 04/27/2021   Effusion of right knee joint 05/31/2020   Pain in right knee 05/31/2020   Pain in joint of left shoulder 05/20/2020   Pain in right foot 04/08/2020   Closed fracture of shaft of right fibula 01/08/2020   Pain in joint of right ankle 01/08/2020   Hematoma of left breast 01/02/2020   Carpal tunnel syndrome 05/27/2018   Pain in finger of right hand 04/24/2018   Pain in joint of right foot 04/24/2018   Hyperkalemia 02/01/2018   Encounter for screening mammogram for breast cancer 01/19/2017   Estrogen deficiency 01/19/2017   Routine general medical examination at a health care facility 06/11/2015   Encounter for Medicare annual wellness exam 11/14/2013   Family history of colon cancer 11/14/2013   Colon cancer screening 11/14/2013   Hypertension 01/27/2011   Osteoporosis 01/27/2011   Post-menopausal 01/27/2011   Low back pain radiating to left leg 03/13/2008   Hyperlipidemia 12/20/2007   CONSTIPATION, CHRONIC 12/20/2007   Prediabetes 12/20/2007   Past Medical History:  Diagnosis Date  Back pain    Chronic constipation    past hx- no current constipation 02-2021   Hyperlipidemia    Hypertension    Multiple gastric ulcers    Osteopenia    Single kidney    donated kidney    Past Surgical History:  Procedure Laterality Date   CLEFT PALATE REPAIR     COLONOSCOPY     FOOT NEUROMA SURGERY  2/06   NEPHRECTOMY LIVING DONOR     Left   TUBAL LIGATION     UPPER GASTROINTESTINAL ENDOSCOPY     Social History   Tobacco Use   Smoking status: Never   Smokeless tobacco: Never  Vaping Use   Vaping Use: Never used  Substance Use Topics   Alcohol use: No   Drug use: No   Family History  Problem Relation Age of Onset   Colon cancer Mother    Diabetes Mother    Coronary artery disease Mother    Osteoporosis Mother    Dementia  Mother    Coronary artery disease Father    Heart failure Father    Hypertension Father    Stomach cancer Neg Hx    Pancreatic cancer Neg Hx    Esophageal cancer Neg Hx    Liver disease Neg Hx    Colon polyps Neg Hx    Rectal cancer Neg Hx    Breast cancer Neg Hx    Allergies  Allergen Reactions   Codeine    Hydrocodone Bit-Homatrop Mbr     Caused flu-like symptoms   Current Outpatient Medications on File Prior to Visit  Medication Sig Dispense Refill   aspirin 81 MG tablet Take 81 mg by mouth daily.     atorvastatin (LIPITOR) 10 MG tablet TAKE 1 TABLET BY MOUTH EVERY DAY 90 tablet 3   Cyanocobalamin (B-12) 1000 MCG CAPS Take 1 capsule by mouth daily.     D 1000 25 MCG (1000 UT) capsule SMARTSIG:1 By Mouth     desonide (DESOWEN) 0.05 % ointment Apply 1 application. topically 2 (two) times daily.     lidocaine (LIDODERM) 5 % Place 1 patch onto the skin daily. Remove & Discard patch within 12 hours or as directed by MD 7 patch 0   lisinopril (ZESTRIL) 5 MG tablet TAKE 1 TABLET (5 MG TOTAL) BY MOUTH DAILY. 90 tablet 1   Misc Natural Products (RA GLUCOSAMINE-CHONDROIT-MSM-D) TABS      pantoprazole (PROTONIX) 40 MG tablet TAKE 1 TABLET BY MOUTH TWICE A DAY 180 tablet 1   No current facility-administered medications on file prior to visit.      Review of Systems  Constitutional:  Negative for activity change, appetite change, fatigue, fever and unexpected weight change.  HENT:  Negative for congestion, ear pain, rhinorrhea, sinus pressure and sore throat.   Eyes:  Negative for pain, redness and visual disturbance.  Respiratory:  Negative for cough, shortness of breath and wheezing.   Cardiovascular:  Negative for chest pain and palpitations.  Gastrointestinal:  Negative for abdominal pain, blood in stool, constipation and diarrhea.  Endocrine: Negative for polydipsia and polyuria.  Genitourinary:  Negative for dysuria, frequency and urgency.  Musculoskeletal:  Positive for  arthralgias and back pain. Negative for myalgias.  Skin:  Negative for pallor and rash.  Allergic/Immunologic: Negative for environmental allergies.  Neurological:  Negative for dizziness, syncope and headaches.  Hematological:  Negative for adenopathy. Does not bruise/bleed easily.  Psychiatric/Behavioral:  Negative for decreased concentration and dysphoric mood. The patient is  not nervous/anxious.        Objective:   Physical Exam Constitutional:      General: She is not in acute distress.    Appearance: Normal appearance. She is well-developed and normal weight. She is not ill-appearing or diaphoretic.  HENT:     Head: Normocephalic and atraumatic.  Eyes:     Conjunctiva/sclera: Conjunctivae normal.     Pupils: Pupils are equal, round, and reactive to light.  Neck:     Thyroid: No thyromegaly.     Vascular: No carotid bruit or JVD.  Cardiovascular:     Rate and Rhythm: Normal rate and regular rhythm.     Heart sounds: Normal heart sounds.     No gallop.  Pulmonary:     Effort: Pulmonary effort is normal. No respiratory distress.     Breath sounds: Normal breath sounds. No wheezing or rales.  Abdominal:     General: There is no distension or abdominal bruit.     Palpations: Abdomen is soft.  Musculoskeletal:     Cervical back: Normal range of motion and neck supple.     Lumbar back: Spasms and tenderness present. No swelling, edema, deformity or bony tenderness. Decreased range of motion. Negative right straight leg raise test and negative left straight leg raise test.     Right hip: No deformity, tenderness, bony tenderness or crepitus. Decreased range of motion. Normal strength.     Left hip: Normal.     Right lower leg: No swelling. No edema.     Left lower leg: No swelling. No edema.     Comments: Right hip- limited ext rotation due to pain  No troch tenderness  Tender in R lumbar musculature and in SI area  No crepitus Some pain with flexion and getting up from  chair   Lymphadenopathy:     Cervical: No cervical adenopathy.  Skin:    General: Skin is warm and dry.     Coloration: Skin is not pale.     Findings: No rash.  Neurological:     Mental Status: She is alert.     Sensory: No sensory deficit.     Motor: No weakness.     Coordination: Coordination normal.     Gait: Gait normal.     Deep Tendon Reflexes: Reflexes are normal and symmetric. Reflexes normal.  Psychiatric:        Mood and Affect: Mood normal.           Assessment & Plan:   Problem List Items Addressed This Visit       Other   Low back pain radiating to left leg    Low back pain now seems more centered on R Also some groin pain and R buttock pain Not rad to leg No neuro sympt Note from Dr Garnette Gunner, also ER visit (Reviewed hospital records, lab results and studies in detail ) Noted deg changes in LS  Reassuring exam/no neuro changes Did briefly improve with steroids from ER  Xray hip today  Then consider PT ? If will need MRI  Inst to stop aleve (h/o PUD) Disc voltaren gel, tylenol prn   Enc her to take breaks Walking as tol Chair exercise as tol       Right groin pain - Primary    This is assoc with R low back/buttock pain  In setting of past lower leg fx No doubt gait has changed   Xray of R hip today  Disc use  of tylenol, voltaren gel  Pend report Consider PT for both issues  Ortho if needed        Relevant Orders   DG Hip Unilat W OR W/O Pelvis 2-3 Views Right

## 2022-10-10 NOTE — Patient Instructions (Addendum)
Chair exercise or chair yoga are great options  Also slow walking   If an exercise hurts -move on to something different    Hip film today   Stay away from aleve (causes stomach ulcers) Voltaren gel is ok on the area that hurts  Tylenol is ok   Heat/ice is ok

## 2022-10-10 NOTE — Assessment & Plan Note (Signed)
Low back pain now seems more centered on R Also some groin pain and R buttock pain Not rad to leg No neuro sympt Note from Dr Garnette Gunner, also ER visit (Reviewed hospital records, lab results and studies in detail ) Noted deg changes in LS  Reassuring exam/no neuro changes Did briefly improve with steroids from ER  Xray hip today  Then consider PT ? If will need MRI  Inst to stop aleve (h/o PUD) Disc voltaren gel, tylenol prn   Enc her to take breaks Walking as tol Chair exercise as tol

## 2022-10-10 NOTE — Assessment & Plan Note (Signed)
This is assoc with R low back/buttock pain  In setting of past lower leg fx No doubt gait has changed   Xray of R hip today  Disc use of tylenol, voltaren gel  Pend report Consider PT for both issues  Ortho if needed

## 2022-10-11 NOTE — Addendum Note (Signed)
Addended by: Loura Pardon A on: 10/11/2022 05:55 PM   Modules accepted: Orders

## 2022-10-13 ENCOUNTER — Telehealth: Payer: Self-pay | Admitting: Family Medicine

## 2022-10-13 NOTE — Telephone Encounter (Signed)
Pt called in requesting a call back stated she would like to see Dr Lynne Leader in Sports medicine want's to know if she needs a referral . Please advise [phone]

## 2022-10-18 ENCOUNTER — Ambulatory Visit: Payer: Medicare HMO | Admitting: Family Medicine

## 2022-10-18 VITALS — BP 152/86 | HR 94 | Ht 61.5 in | Wt 132.0 lb

## 2022-10-18 DIAGNOSIS — M7061 Trochanteric bursitis, right hip: Secondary | ICD-10-CM | POA: Diagnosis not present

## 2022-10-18 DIAGNOSIS — M25551 Pain in right hip: Secondary | ICD-10-CM

## 2022-10-18 NOTE — Patient Instructions (Addendum)
Thank you for coming in today.   I've referred you to Physical Therapy.  Let us know if you don't hear from them in one week.   Recheck in about 6 weeks.   I can do a shot sooner.

## 2022-10-18 NOTE — Progress Notes (Signed)
I, Peterson Lombard, LAT, ATC acting as a scribe for Lynne Leader, MD.  Subjective:    CC: Low back and R hip pain  HPI: Patient is an 82 year old female presenting with low back and R hip pain ongoing since 12/6. Pt woke up in the middle of the night and just couldn't fully stand up straight. Pt was seen at the [facility] ED on 09/14/22 for this complaint. Patient locates pain to R buttock and into R groin.   Radiating pain: yes- R leg, posterior-lateral aspect LE numbness/tingling: no LE weakness: no Aggravates: increase activity Treatments tried: hydromorphone, heating pad, willow balm, Tylenol  Dx imaging: 10/10/2022 R hip x-ray  09/14/2022 L-spine x-ray  Pertinent review of Systems: No fevers or chills  Relevant historical information: Hypertension. Osteoporosis  Objective:    Vitals:   10/18/22 1323  BP: (!) 152/86  Pulse: 94  SpO2: 98%   General: Well Developed, well nourished, and in no acute distress.   MSK: L-spine: Normal appearing Nontender to palpation spinal midline. Normal lumbar motion. Right hip: Normal-appearing Normal motion. Some pain with hip flexion and rotation felt mostly in the lateral hip. Tender palpation at greater trochanter.  Hip abduction and external rotation strength are diminished 4 -/5 Left hip normal-appearing normal motion nontender normal strength Leg lengths are equal   Lab and Radiology Results  EXAM: DG HIP (WITH OR WITHOUT PELVIS) 3V RIGHT   COMPARISON:  05/02/2013   FINDINGS: Pelvic ring is intact. Degenerative changes of lumbar spine are seen. No acute fracture or dislocation is noted. No soft tissue abnormality is seen.   IMPRESSION: No acute abnormality noted.     Electronically Signed   By: Inez Catalina M.D.   On: 10/10/2022 23:09  EXAM: LUMBAR SPINE - COMPLETE 5 VIEW   COMPARISON:  CT abdomen and pelvis dated 01/04/2021   FINDINGS: Surgical clips project over the medial left hemiabdomen at the  level of L1-2 and S5. There is no evidence of lumbar spine fracture. Straightening of the lumbar lordosis. Multilevel degenerative changes of the lumbar spine, most pronounced at L2-3, L3-4, and L4-5. Vascular calcifications.   IMPRESSION: Multilevel degenerative changes of the lumbar spine, most pronounced at L2-3, L3-4, and L4-5.     Electronically Signed   By: Darrin Nipper M.D.   On: 09/14/2022 16:44 I, Lynne Leader, personally (independently) visualized and performed the interpretation of the images attached in this note.    Impression and Recommendations:    Assessment and Plan: 82 y.o. female with right lateral hip pain thought permanently due to trochanteric bursitis or tendinopathy of the hip abductors and rotators.  She is a great candidate for physical therapy.  Plan to refer to PT  She does have some anterior hip pain.  The etiology of this pain is not clear at this time.  Typically this would be due to hip arthritis but she does not have significant hip arthritis on x-ray.  Plan to proceed to PT and if not better trial of injection or advanced imaging such as MRI arthrogram may be indicated.  PDMP not reviewed this encounter. Orders Placed This Encounter  Procedures   Ambulatory referral to Physical Therapy    Referral Priority:   Routine    Referral Type:   Physical Medicine    Referral Reason:   Specialty Services Required    Requested Specialty:   Physical Therapy    Number of Visits Requested:   1   No orders of the defined  types were placed in this encounter.   Discussed warning signs or symptoms. Please see discharge instructions. Patient expresses understanding.   The above documentation has been reviewed and is accurate and complete Lynne Leader, M.D.

## 2022-10-25 DIAGNOSIS — K219 Gastro-esophageal reflux disease without esophagitis: Secondary | ICD-10-CM | POA: Diagnosis not present

## 2022-10-25 DIAGNOSIS — Z008 Encounter for other general examination: Secondary | ICD-10-CM | POA: Diagnosis not present

## 2022-10-25 DIAGNOSIS — I10 Essential (primary) hypertension: Secondary | ICD-10-CM | POA: Diagnosis not present

## 2022-10-25 DIAGNOSIS — M810 Age-related osteoporosis without current pathological fracture: Secondary | ICD-10-CM | POA: Diagnosis not present

## 2022-10-25 DIAGNOSIS — Z87891 Personal history of nicotine dependence: Secondary | ICD-10-CM | POA: Diagnosis not present

## 2022-10-25 DIAGNOSIS — E785 Hyperlipidemia, unspecified: Secondary | ICD-10-CM | POA: Diagnosis not present

## 2022-10-25 DIAGNOSIS — M1990 Unspecified osteoarthritis, unspecified site: Secondary | ICD-10-CM | POA: Diagnosis not present

## 2022-10-25 DIAGNOSIS — M5430 Sciatica, unspecified side: Secondary | ICD-10-CM | POA: Diagnosis not present

## 2022-10-25 DIAGNOSIS — Z823 Family history of stroke: Secondary | ICD-10-CM | POA: Diagnosis not present

## 2022-10-25 DIAGNOSIS — Z8249 Family history of ischemic heart disease and other diseases of the circulatory system: Secondary | ICD-10-CM | POA: Diagnosis not present

## 2022-10-25 DIAGNOSIS — Z809 Family history of malignant neoplasm, unspecified: Secondary | ICD-10-CM | POA: Diagnosis not present

## 2022-10-25 DIAGNOSIS — I700 Atherosclerosis of aorta: Secondary | ICD-10-CM | POA: Diagnosis not present

## 2022-10-25 DIAGNOSIS — H259 Unspecified age-related cataract: Secondary | ICD-10-CM | POA: Diagnosis not present

## 2022-10-26 ENCOUNTER — Ambulatory Visit: Payer: Medicare HMO | Admitting: Rehabilitative and Restorative Service Providers"

## 2022-10-26 ENCOUNTER — Encounter: Payer: Self-pay | Admitting: Rehabilitative and Restorative Service Providers"

## 2022-10-26 DIAGNOSIS — M25651 Stiffness of right hip, not elsewhere classified: Secondary | ICD-10-CM

## 2022-10-26 DIAGNOSIS — M6281 Muscle weakness (generalized): Secondary | ICD-10-CM | POA: Diagnosis not present

## 2022-10-26 DIAGNOSIS — R262 Difficulty in walking, not elsewhere classified: Secondary | ICD-10-CM

## 2022-10-26 DIAGNOSIS — M25551 Pain in right hip: Secondary | ICD-10-CM

## 2022-10-26 NOTE — Therapy (Signed)
OUTPATIENT PHYSICAL THERAPY LOWER EXTREMITY EVALUATION   Patient Name: MICHI HERRMANN MRN: [id number] DOB:[birth year], 82 y.o., female Today's Date: 10/26/2022  END OF SESSION:  PT End of Session - 10/26/22 1229     Visit Number 1    Number of Visits 12    Date for PT Re-Evaluation 12/21/22    Progress Note Due on Visit 10    PT Start Time 0934    PT Stop Time 1017    PT Time Calculation (min) 43 min    Activity Tolerance Patient tolerated treatment well;Patient limited by pain    Behavior During Therapy [facility] for tasks assessed/performed             Past Medical History:  Diagnosis Date   Back pain    Chronic constipation    past hx- no current constipation 02-2021   Hyperlipidemia    Hypertension    Multiple gastric ulcers    Osteopenia    Single kidney    donated kidney    Past Surgical History:  Procedure Laterality Date   [place]  2/06   NEPHRECTOMY LIVING DONOR     Left   TUBAL LIGATION     UPPER GASTROINTESTINAL ENDOSCOPY     Patient Active Problem List   Diagnosis Date Noted   Right groin pain 10/10/2022   Current use of proton pump inhibitor 04/18/2022   Decreased GFR 04/18/2022   Vitamin B12 deficiency 04/18/2022   Multiple gastric ulcers 04/27/2021   Effusion of right knee joint 05/31/2020   Pain in right knee 05/31/2020   Pain in joint of left shoulder 05/20/2020   Pain in right foot 04/08/2020   Closed fracture of shaft of right fibula 01/08/2020   Pain in joint of right ankle 01/08/2020   Hematoma of left breast 01/02/2020   Carpal tunnel syndrome 05/27/2018   Pain in finger of right hand 04/24/2018   Pain in joint of right foot 04/24/2018   Hyperkalemia 02/01/2018   Encounter for screening mammogram for breast cancer 01/19/2017   Estrogen deficiency 01/19/2017   Routine general medical examination at a health care facility 06/11/2015   Encounter for Medicare annual wellness exam  11/14/2013   Family history of colon cancer 11/14/2013   Colon cancer screening 11/14/2013   Hypertension 01/27/2011   Osteoporosis 01/27/2011   Post-menopausal 01/27/2011   Low back pain radiating to left leg 03/13/2008   Hyperlipidemia 12/20/2007   CONSTIPATION, CHRONIC 12/20/2007   Prediabetes 12/20/2007    PCP: Abner Greenspan, MD  REFERRING PROVIDER: Gregor Hams, MD  REFERRING DIAG:  Diagnosis  M25.551 (ICD-10-CM) - Right hip pain  M70.61 (ICD-10-CM) - Trochanteric bursitis of right hip    THERAPY DIAG:  Difficulty in walking, not elsewhere classified  Muscle weakness (generalized)  Stiffness of right hip, not elsewhere classified  Pain in right hip  Rationale for Evaluation and Treatment: Rehabilitation  ONSET DATE: Early December 2023  SUBJECTIVE:   SUBJECTIVE STATEMENT: Tameisha went to the ER with right, central groin pain in December of 2023 of no particular onset.  Pain is now more right groin and lateral hip.  Pain is about the same as it was in December.  PERTINENT HISTORY: HLD, HTN, osteopenia, 1 kidney, previous foot neuroma surgery, previous Rt fibular fracture, pre-diabetes PAIN:  Are you having pain? Yes: NPRS scale: 2-6/10 this week on a 10/10 Pain location: Right lateral and posterior-lateral  hip Pain description: Ache, grabbing Aggravating factors: Prolonged postures, sit to stand particularly after prolonged sitting Relieving factors: Topical willow balm  PRECAUTIONS: None  WEIGHT BEARING RESTRICTIONS: No  FALLS:  Has patient fallen in last 6 months? No  LIVING ENVIRONMENT: Lives with: lives with their spouse Lives in: House/apartment Stairs:  Needs handrail Has following equipment at home: Single point cane but doesn't need it  OCCUPATION: Retired  PLOF: Independent  PATIENT GOALS: Return to playing with great grandchildren and return to walking  NEXT MD VISIT: ?  OBJECTIVE:   DIAGNOSTIC FINDINGS: FINDINGS: Pelvic ring is  intact. Degenerative changes of lumbar spine are seen. No acute fracture or dislocation is noted. No soft tissue abnormality is seen.   IMPRESSION: No acute abnormality noted.  PATIENT SURVEYS:  FOTO 52 (Goal 66 in 12 visits)  COGNITION: Overall cognitive status: Within functional limits for tasks assessed     SENSATION: No tingling or paresthesias noted   MUSCLE LENGTH: Hamstrings: Right 45 deg; Left 50 deg   LOWER EXTREMITY ROM:  Passive ROM Left/Right 10/26/2022   Hip flexion 100/85   Hip extension    Hip abduction    Hip adduction    Hip internal rotation 2/0   Hip external rotation 26/19   Knee flexion    Knee extension    Ankle dorsiflexion    Ankle plantarflexion    Ankle inversion    Ankle eversion     (Blank rows = not tested)  LOWER EXTREMITY MMT:  Strength 10/26/2022   Hip flexion Strength deferred at eval due to pain   Hip extension    Hip abduction    Hip adduction    Hip internal rotation    Hip external rotation    Knee flexion    Knee extension    Ankle dorsiflexion    Ankle plantarflexion    Ankle inversion    Ankle eversion     (Blank rows = not tested)  GAIT: Distance walked: In the clinic Assistive device utilized: None Level of assistance: Complete Independence Comments: Decreased stance time Right with Right lateral lean   TODAY'S TREATMENT:                                                                                                                              DATE: 10/26/2022 Single knee to chest stretch (other leg straight) 3X 20 seconds Gluteal stretch 3X 20 seconds Figure 4 stretch supine 3X 20 seconds Alternating hip hike 10X 3 seconds and hip hike, hike left side only 10X 3 seconds   PATIENT EDUCATION:  Education details: Reviewed HEP and exam findings Person educated: Patient Education method: Explanation, Demonstration, Tactile cues, Verbal cues, and Handouts Education comprehension: verbalized understanding,  returned demonstration, verbal cues required, tactile cues required, and needs further education  HOME EXERCISE PROGRAM: Access Code: [email or web address] URL: [email or web address] Date: 10/26/2022 Prepared by: Vista Mink  Exercises - Supine Gluteus Stretch  - 2-3 x daily -  7 x weekly - 1 sets - 5 reps - 20 seconds hold - Supine Figure 4 Piriformis Stretch  - 2-3 x daily - 7 x weekly - 1 sets - 5 reps - 20 seconds hold - Standing Hip Hiking  - 3-5 x daily - 7 x weekly - 1 sets - 10 reps - 3 seconds hold  ASSESSMENT:  CLINICAL IMPRESSION: Patient is a 82 y.o. female who was seen today for physical therapy evaluation and treatment for right hip/groin pain.   OBJECTIVE IMPAIRMENTS: Abnormal gait, decreased activity tolerance, decreased endurance, decreased knowledge of condition, difficulty walking, decreased ROM, decreased strength, increased edema, impaired perceived functional ability, increased muscle spasms, impaired flexibility, and pain.   ACTIVITY LIMITATIONS: carrying, lifting, bending, sitting, standing, squatting, sleeping, stairs, transfers, bed mobility, and locomotion level  PARTICIPATION LIMITATIONS: community activity  PERSONAL FACTORS: HLD, HTN, osteopenia, 1 kidney, previous foot neuroma surgery, previous Rt fibular fracture, pre-diabetes are also affecting patient's functional outcome.   REHAB POTENTIAL: Good  CLINICAL DECISION MAKING: Stable/uncomplicated  EVALUATION COMPLEXITY: Low   GOALS: Goals reviewed with patient? Yes  SHORT TERM GOALS: Target date: 11/23/2022 Lisbet will be independent with her day 1 HEP Baseline: Started 10/26/2022 Goal status: INITIAL  2.  Improve right hip flexion to 100 degrees, IR to 5 degrees and ER to 30 degrees Baseline: 85, 0 and 19 respectively Goal status: INITIAL   LONG TERM GOALS: Target date: 12/21/2022  Improve FOTO to 66 in 12 visits Baseline: 52 Goal status: INITIAL  2.  Marilene will report right hip,  gluteal and groin pain consistently 0-3/10 on the Visual Analog Scale (VAS). Baseline: 2-6/10 Goal status: INITIAL  3.  Improve right hip ER to 35 degrees Baseline: 19 degrees Goal status: INITIAL  4.  Improve right hip abductors strength to 4+/5 MMT or better Baseline: Deferred due to pain for MMT assessment but decreased stance time and lateral lean noted with gait Goal status: INITIAL  5.  Myranda will be independent with her long-term HEP at DC Baseline: Started 10/26/2022 Goal status: INITIAL  PLAN:  PT FREQUENCY: 1-2x/week  PT DURATION: 8 weeks  PLANNED INTERVENTIONS: Therapeutic exercises, Therapeutic activity, Neuromuscular re-education, Balance training, Gait training, Patient/Family education, Self Care, Stair training, Dry Needling, Cryotherapy, Moist heat, and Manual therapy  PLAN FOR NEXT SESSION: Review HEP, appropriately progress hip strength from partial WB to full WB.   Farley Ly, PT, MPT 10/26/2022, 12:31 PM

## 2022-11-02 ENCOUNTER — Encounter: Payer: Self-pay | Admitting: Rehabilitative and Restorative Service Providers"

## 2022-11-02 ENCOUNTER — Ambulatory Visit: Payer: Medicare HMO | Admitting: Rehabilitative and Restorative Service Providers"

## 2022-11-02 DIAGNOSIS — R262 Difficulty in walking, not elsewhere classified: Secondary | ICD-10-CM

## 2022-11-02 DIAGNOSIS — M25551 Pain in right hip: Secondary | ICD-10-CM | POA: Diagnosis not present

## 2022-11-02 DIAGNOSIS — M6281 Muscle weakness (generalized): Secondary | ICD-10-CM | POA: Diagnosis not present

## 2022-11-02 DIAGNOSIS — M25651 Stiffness of right hip, not elsewhere classified: Secondary | ICD-10-CM | POA: Diagnosis not present

## 2022-11-02 NOTE — Therapy (Signed)
OUTPATIENT PHYSICAL THERAPY TREATMENT NOTE   Patient Name: [name] MRN: [id number] DOB:08/31/1941, 82 y.o., female Today's Date: 11/02/2022  END OF SESSION:   PT End of Session - 11/02/22 1526     Visit Number 2    Number of Visits 12    Date for PT Re-Evaluation 12/21/22    Progress Note Due on Visit 10    PT Start Time 1433    PT Stop Time 1521    PT Time Calculation (min) 48 min    Activity Tolerance Patient tolerated treatment well;Patient limited by pain;No increased pain    Behavior During Therapy WFL for tasks assessed/performed             Past Medical History:  Diagnosis Date   Back pain    Chronic constipation    past hx- no current constipation 02-2021   Hyperlipidemia    Hypertension    Multiple gastric ulcers    Osteopenia    Single kidney    donated kidney    Past Surgical History:  Procedure Laterality Date   CLEFT PALATE REPAIR     COLONOSCOPY     FOOT NEUROMA SURGERY  2/06   NEPHRECTOMY LIVING DONOR     Left   TUBAL LIGATION     UPPER GASTROINTESTINAL ENDOSCOPY     Patient Active Problem List   Diagnosis Date Noted   Right groin pain 10/10/2022   Current use of proton pump inhibitor 04/18/2022   Decreased GFR 04/18/2022   Vitamin B12 deficiency 04/18/2022   Multiple gastric ulcers 04/27/2021   Effusion of right knee joint 05/31/2020   Pain in right knee 05/31/2020   Pain in joint of left shoulder 05/20/2020   Pain in right foot 04/08/2020   Closed fracture of shaft of right fibula 01/08/2020   Pain in joint of right ankle 01/08/2020   Hematoma of left breast 01/02/2020   Carpal tunnel syndrome 05/27/2018   Pain in finger of right hand 04/24/2018   Pain in joint of right foot 04/24/2018   Hyperkalemia 02/01/2018   Encounter for screening mammogram for breast cancer 01/19/2017   Estrogen deficiency 01/19/2017   Routine general medical examination at a health care facility 06/11/2015   Encounter for Medicare annual wellness  exam 11/14/2013   Family history of colon cancer 11/14/2013   Colon cancer screening 11/14/2013   Hypertension 01/27/2011   Osteoporosis 01/27/2011   Post-menopausal 01/27/2011   Low back pain radiating to left leg 03/13/2008   Hyperlipidemia 12/20/2007   CONSTIPATION, CHRONIC 12/20/2007   Prediabetes 12/20/2007     THERAPY DIAG:  Difficulty in walking, not elsewhere classified  Muscle weakness (generalized)  Stiffness of right hip, not elsewhere classified  Pain in right hip  PCP: Abner Greenspan, MD   REFERRING PROVIDER: Gregor Hams, MD   REFERRING DIAG:  Diagnosis  M25.551 (ICD-10-CM) - Right hip pain  M70.61 (ICD-10-CM) - Trochanteric bursitis of right hip      THERAPY DIAG:  Difficulty in walking, not elsewhere classified   Muscle weakness (generalized)   Stiffness of right hip, not elsewhere classified   Pain in right hip   Rationale for Evaluation and Treatment: Rehabilitation   ONSET DATE: Early December 2023   SUBJECTIVE:    SUBJECTIVE STATEMENT: [name] reports good early HEP compliance.  She is having less trouble with the exercises although pain is similar to evaluation.   PERTINENT HISTORY: HLD, HTN, osteopenia, 1 kidney, previous foot neuroma surgery, previous Rt  fibular fracture, pre-diabetes PAIN:  Are you having pain? Yes: NPRS scale: 2-6/10 this week on a 10/10 Pain location: Right lateral and posterior-lateral hip Pain description: Ache, grabbing Aggravating factors: Prolonged postures, sit to stand particularly after prolonged sitting Relieving factors: Topical willow balm   PRECAUTIONS: None   WEIGHT BEARING RESTRICTIONS: No   FALLS:  Has patient fallen in last 6 months? No   LIVING ENVIRONMENT: Lives with: lives with their spouse Lives in: House/apartment Stairs:  Needs handrail Has following equipment at home: Single point cane but doesn't need it   OCCUPATION: Retired   PLOF: Independent   PATIENT GOALS: Return to  playing with great grandchildren and return to walking   NEXT MD VISIT: ?   OBJECTIVE:    DIAGNOSTIC FINDINGS: FINDINGS: Pelvic ring is intact. Degenerative changes of lumbar spine are seen. No acute fracture or dislocation is noted. No soft tissue abnormality is seen.   IMPRESSION: No acute abnormality noted.   PATIENT SURVEYS:  FOTO 52 (Goal 66 in 12 visits)   COGNITION: Overall cognitive status: Within functional limits for tasks assessed                         SENSATION: No tingling or paresthesias noted     MUSCLE LENGTH: Hamstrings: Right 45 deg; Left 50 deg     LOWER EXTREMITY ROM:   Passive ROM Left/Right 10/26/2022    Hip flexion 100/85    Hip extension      Hip abduction      Hip adduction      Hip internal rotation 2/0    Hip external rotation 26/19    Knee flexion      Knee extension      Ankle dorsiflexion      Ankle plantarflexion      Ankle inversion      Ankle eversion       (Blank rows = not tested)   LOWER EXTREMITY MMT:   Strength 10/26/2022    Hip flexion Strength deferred at eval due to pain    Hip extension      Hip abduction      Hip adduction      Hip internal rotation      Hip external rotation      Knee flexion      Knee extension      Ankle dorsiflexion      Ankle plantarflexion      Ankle inversion      Ankle eversion       (Blank rows = not tested)   GAIT: Distance walked: In the clinic Assistive device utilized: None Level of assistance: Complete Independence Comments: Decreased stance time Right with Right lateral lean     TODAY'S TREATMENT:  DATE:  11/02/2022 Single knee to chest stretch (other leg straight) 5X 20 seconds Gluteal stretch 5X 20 seconds Figure 4 stretch supine 5X 20 seconds Clams (lie left, lift right) 10X no resistance and 10X with red theraband resistance Side lie hip  abduction (lie left, lift right, 1/4 turn to stomach) 10X 3 seconds Alternating hip hike 10X 3 seconds and hip hike, hike left side only 10X 3 seconds  Neuromuscular re-education: Tandem balance 1X 20 seconds Bil (had to stop due to discomfort)   10/26/2022 Single knee to chest stretch (other leg straight) 3X 20 seconds Gluteal stretch 3X 20 seconds Figure 4 stretch supine 3X 20 seconds Alternating hip hike 10X 3 seconds and hip hike, hike left side only 10X 3 seconds     PATIENT EDUCATION:  Education details: Reviewed HEP and exam findings Person educated: Patient Education method: Explanation, Demonstration, Tactile cues, Verbal cues, and Handouts Education comprehension: verbalized understanding, returned demonstration, verbal cues required, tactile cues required, and needs further education   HOME EXERCISE PROGRAM: Access Code: [email or web address] URL: [email or web address] Date: 11/02/2022 Prepared by: Vista Mink  Exercises - Supine Gluteus Stretch  - 2-3 x daily - 7 x weekly - 1 sets - 5 reps - 20 seconds hold - Supine Figure 4 Piriformis Stretch  - 2-3 x daily - 7 x weekly - 1 sets - 5 reps - 20 seconds hold - Standing Hip Hiking  - 3-5 x daily - 7 x weekly - 1 sets - 10 reps - 3 seconds hold - Clam with Resistance  - 2 x daily - 7 x weekly - 2-3 sets - 10 reps - 3 seconds hold   ASSESSMENT:   CLINICAL IMPRESSION: Nakesha needed some correction with her exercises from day 1.  Overall, her pain with exercises and pain-free AROM is better.  Pain with ADLs is similar to evaluation.  Hip abductors strengthening will be important and not progressing her too fast will be a priority to avoid flare-ups.  Her prognosis remains good with continued supervised PT.   OBJECTIVE IMPAIRMENTS: Abnormal gait, decreased activity tolerance, decreased endurance, decreased knowledge of condition, difficulty walking, decreased ROM, decreased strength, increased edema, impaired perceived  functional ability, increased muscle spasms, impaired flexibility, and pain.    ACTIVITY LIMITATIONS: carrying, lifting, bending, sitting, standing, squatting, sleeping, stairs, transfers, bed mobility, and locomotion level   PARTICIPATION LIMITATIONS: community activity   PERSONAL FACTORS: HLD, HTN, osteopenia, 1 kidney, previous foot neuroma surgery, previous Rt fibular fracture, pre-diabetes are also affecting patient's functional outcome.    REHAB POTENTIAL: Good   CLINICAL DECISION MAKING: Stable/uncomplicated   EVALUATION COMPLEXITY: Low     GOALS: Goals reviewed with patient? Yes   SHORT TERM GOALS: Target date: 11/23/2022 Iyona will be independent with her day 1 HEP Baseline: Started 10/26/2022 Goal status: On Going 11/02/2022   2.  Improve right hip flexion to 100 degrees, IR to 5 degrees and ER to 30 degrees Baseline: 85, 0 and 19 respectively Goal status: INITIAL     LONG TERM GOALS: Target date: 12/21/2022   Improve FOTO to 66 in 12 visits Baseline: 52 Goal status: INITIAL   2.  Samiyah will report right hip, gluteal and groin pain consistently 0-3/10 on the Visual Analog Scale (VAS). Baseline: 2-6/10 Goal status: On Going 11/02/2022   3.  Improve right hip ER to 35 degrees Baseline: 19 degrees Goal status: INITIAL   4.  Improve right hip abductors strength to 4+/5 MMT or better Baseline: Deferred due  to pain for MMT assessment but decreased stance time and lateral lean noted with gait Goal status: On Going 11/02/2022   5.  Anatalia will be independent with her long-term HEP at DC Baseline: Started 10/26/2022 Goal status: On Going 11/02/2022   PLAN:   PT FREQUENCY: 1-2x/week   PT DURATION: 8 weeks   PLANNED INTERVENTIONS: Therapeutic exercises, Therapeutic activity, Neuromuscular re-education, Balance training, Gait training, Patient/Family education, Self Care, Stair training, Dry Needling, Cryotherapy, Moist heat, and Manual therapy   PLAN FOR NEXT  SESSION: Appropriately progress hip strength from partial WB to full WB.  Nu Step?  Leg Press?  Revisit balance.      Farley Ly, PT, MPT 11/02/2022, 3:31 PM

## 2022-11-08 ENCOUNTER — Encounter: Payer: Self-pay | Admitting: Rehabilitative and Restorative Service Providers"

## 2022-11-08 ENCOUNTER — Ambulatory Visit: Payer: Medicare HMO | Admitting: Rehabilitative and Restorative Service Providers"

## 2022-11-08 DIAGNOSIS — R262 Difficulty in walking, not elsewhere classified: Secondary | ICD-10-CM

## 2022-11-08 DIAGNOSIS — M25551 Pain in right hip: Secondary | ICD-10-CM | POA: Diagnosis not present

## 2022-11-08 DIAGNOSIS — M25651 Stiffness of right hip, not elsewhere classified: Secondary | ICD-10-CM | POA: Diagnosis not present

## 2022-11-08 DIAGNOSIS — M6281 Muscle weakness (generalized): Secondary | ICD-10-CM

## 2022-11-08 NOTE — Therapy (Signed)
OUTPATIENT PHYSICAL THERAPY TREATMENT NOTE   Patient Name: [name] MRN: [id number] DOB:03-Aug-1941, 82 y.o., female Today's Date: 11/08/2022  END OF SESSION:   PT End of Session - 11/08/22 1739     Visit Number 3    Number of Visits 12    Date for PT Re-Evaluation 12/21/22    Progress Note Due on Visit 10    PT Start Time 2505    PT Stop Time 1432    PT Time Calculation (min) 45 min    Activity Tolerance Patient tolerated treatment well;Patient limited by pain;No increased pain    Behavior During Therapy WFL for tasks assessed/performed              Past Medical History:  Diagnosis Date   Back pain    Chronic constipation    past hx- no current constipation 02-2021   Hyperlipidemia    Hypertension    Multiple gastric ulcers    Osteopenia    Single kidney    donated kidney    Past Surgical History:  Procedure Laterality Date   CLEFT PALATE REPAIR     COLONOSCOPY     FOOT NEUROMA SURGERY  2/06   NEPHRECTOMY LIVING DONOR     Left   TUBAL LIGATION     UPPER GASTROINTESTINAL ENDOSCOPY     Patient Active Problem List   Diagnosis Date Noted   Right groin pain 10/10/2022   Current use of proton pump inhibitor 04/18/2022   Decreased GFR 04/18/2022   Vitamin B12 deficiency 04/18/2022   Multiple gastric ulcers 04/27/2021   Effusion of right knee joint 05/31/2020   Pain in right knee 05/31/2020   Pain in joint of left shoulder 05/20/2020   Pain in right foot 04/08/2020   Closed fracture of shaft of right fibula 01/08/2020   Pain in joint of right ankle 01/08/2020   Hematoma of left breast 01/02/2020   Carpal tunnel syndrome 05/27/2018   Pain in finger of right hand 04/24/2018   Pain in joint of right foot 04/24/2018   Hyperkalemia 02/01/2018   Encounter for screening mammogram for breast cancer 01/19/2017   Estrogen deficiency 01/19/2017   Routine general medical examination at a health care facility 06/11/2015   Encounter for Medicare annual wellness  exam 11/14/2013   Family history of colon cancer 11/14/2013   Colon cancer screening 11/14/2013   Hypertension 01/27/2011   Osteoporosis 01/27/2011   Post-menopausal 01/27/2011   Low back pain radiating to left leg 03/13/2008   Hyperlipidemia 12/20/2007   CONSTIPATION, CHRONIC 12/20/2007   Prediabetes 12/20/2007     THERAPY DIAG:  Difficulty in walking, not elsewhere classified  Muscle weakness (generalized)  Stiffness of right hip, not elsewhere classified  Pain in right hip  PCP: Abner Greenspan, MD   REFERRING PROVIDER: Gregor Hams, MD   REFERRING DIAG:  Diagnosis  M25.551 (ICD-10-CM) - Right hip pain  M70.61 (ICD-10-CM) - Trochanteric bursitis of right hip      THERAPY DIAG:  Difficulty in walking, not elsewhere classified   Muscle weakness (generalized)   Stiffness of right hip, not elsewhere classified   Pain in right hip   Rationale for Evaluation and Treatment: Rehabilitation   ONSET DATE: Early December 2023   SUBJECTIVE:    SUBJECTIVE STATEMENT: [name] reports continued HEP compliance.  Overall pain has improved a bit, although she still has significant symptoms with overuse.   PERTINENT HISTORY: HLD, HTN, osteopenia, 1 kidney, previous foot neuroma surgery, previous Rt  fibular fracture, pre-diabetes PAIN:  Are you having pain? Yes: NPRS scale: 2-5/10 this week on a 10/10 Pain location: Right lateral and posterior-lateral hip Pain description: Ache, grabbing Aggravating factors: Prolonged postures, sit to stand particularly after prolonged sitting Relieving factors: Topical willow balm   PRECAUTIONS: None   WEIGHT BEARING RESTRICTIONS: No   FALLS:  Has patient fallen in last 6 months? No   LIVING ENVIRONMENT: Lives with: lives with their spouse Lives in: House/apartment Stairs:  Needs handrail Has following equipment at home: Single point cane but doesn't need it   OCCUPATION: Retired   PLOF: Independent   PATIENT GOALS: Return to  playing with great grandchildren and return to walking   NEXT MD VISIT: ?   OBJECTIVE:    DIAGNOSTIC FINDINGS: FINDINGS: Pelvic ring is intact. Degenerative changes of lumbar spine are seen. No acute fracture or dislocation is noted. No soft tissue abnormality is seen.   IMPRESSION: No acute abnormality noted.   PATIENT SURVEYS:  FOTO 52 (Goal 66 in 12 visits)   COGNITION: Overall cognitive status: Within functional limits for tasks assessed                         SENSATION: No tingling or paresthesias noted     MUSCLE LENGTH: Hamstrings: Right 45 deg; Left 50 deg     LOWER EXTREMITY ROM:   Passive ROM Left/Right 10/26/2022    Hip flexion 100/85    Hip extension      Hip abduction      Hip adduction      Hip internal rotation 2/0    Hip external rotation 26/19    Knee flexion      Knee extension      Ankle dorsiflexion      Ankle plantarflexion      Ankle inversion      Ankle eversion       (Blank rows = not tested)   LOWER EXTREMITY MMT:   Strength 10/26/2022    Hip flexion Strength deferred at eval due to pain    Hip extension      Hip abduction      Hip adduction      Hip internal rotation      Hip external rotation      Knee flexion      Knee extension      Ankle dorsiflexion      Ankle plantarflexion      Ankle inversion      Ankle eversion       (Blank rows = not tested)   GAIT: Distance walked: In the clinic Assistive device utilized: None Level of assistance: Complete Independence Comments: Decreased stance time Right with Right lateral lean     TODAY'S TREATMENT:  DATE:  11/08/2022 Single knee to chest stretch (other leg straight) 4X 20 seconds Gluteal stretch 4X 20 seconds Figure 4 stretch supine 4X 20 seconds Clams (lie left, lift right) 2 sets of 10X with red theraband resistance Side lie hip abduction (lie  left, lift right, 1/4 turn to stomach) 2 sets 10X 3 seconds Prone alternating hip extensions 2 sets of 10 for 3 seconds Alternating hip hike 10X 3 seconds  Neuromuscular re-education: Tandem balance 4X 20 seconds Bil   11/02/2022 Single knee to chest stretch (other leg straight) 5X 20 seconds Gluteal stretch 5X 20 seconds Figure 4 stretch supine 5X 20 seconds Clams (lie left, lift right) 10X no resistance and 10X with red theraband resistance Side lie hip abduction (lie left, lift right, 1/4 turn to stomach) 10X 3 seconds Alternating hip hike 10X 3 seconds and hip hike, hike left side only 10X 3 seconds  Neuromuscular re-education: Tandem balance 1X 20 seconds Bil (had to stop due to discomfort)   10/26/2022 Single knee to chest stretch (other leg straight) 3X 20 seconds Gluteal stretch 3X 20 seconds Figure 4 stretch supine 3X 20 seconds Alternating hip hike 10X 3 seconds and hip hike, hike left side only 10X 3 seconds     PATIENT EDUCATION:  Education details: Reviewed HEP and exam findings Person educated: Patient Education method: Explanation, Demonstration, Tactile cues, Verbal cues, and Handouts Education comprehension: verbalized understanding, returned demonstration, verbal cues required, tactile cues required, and needs further education   HOME EXERCISE PROGRAM: Access Code: [email or web address] URL: [email or web address] Date: 11/08/2022 Prepared by: Vista Mink  Exercises - Supine Gluteus Stretch  - 2 x daily - 7 x weekly - 1 sets - 5 reps - 20 seconds hold - Supine Figure 4 Piriformis Stretch  - 2 x daily - 7 x weekly - 1 sets - 5 reps - 20 seconds hold - Standing Hip Hiking  - 3-5 x daily - 3-7 x weekly - 1 sets - 10 reps - 3 seconds hold - Clam with Resistance  - 2 x daily - 3-7 x weekly - 2-3 sets - 10 reps - 3 seconds hold - Single Knee to Chest Stretch  - 2 x daily - 7 x weekly - 1 sets - 5 reps - 20 seconds hold - Sidelying Hip Abduction  - 1 x daily -  3-7 x weekly - 2 sets - 10 reps - 5 seconds hold - Prone Hip Extension  - 1 x daily - 3-7 x weekly - 2 sets - 10 reps - 3 seconds hold    ASSESSMENT:   CLINICAL IMPRESSION: Kiara is making some progress with her pain, gait endurance and gait quality since day 1.  She is still quite limited with her weight-bearing endurance due to significant right-sided hip weakness.  Although this is making progress as assessed by ease with her exercises and quality of gait, she still has significant impairments in these areas and will benefit from the recommended course of rehabilitation.  OBJECTIVE IMPAIRMENTS: Abnormal gait, decreased activity tolerance, decreased endurance, decreased knowledge of condition, difficulty walking, decreased ROM, decreased strength, increased edema, impaired perceived functional ability, increased muscle spasms, impaired flexibility, and pain.    ACTIVITY LIMITATIONS: carrying, lifting, bending, sitting, standing, squatting, sleeping, stairs, transfers, bed mobility, and locomotion level   PARTICIPATION LIMITATIONS: community activity   PERSONAL FACTORS: HLD, HTN, osteopenia, 1 kidney, previous foot neuroma surgery, previous Rt fibular fracture, pre-diabetes are also affecting patient's functional outcome.    REHAB POTENTIAL: Good  CLINICAL DECISION MAKING: Stable/uncomplicated   EVALUATION COMPLEXITY: Low     GOALS: Goals reviewed with patient? Yes   SHORT TERM GOALS: Target date: 11/23/2022 Courtlynn will be independent with her day 1 HEP Baseline: Started 10/26/2022 Goal status: On Going 11/08/2022   2.  Improve right hip flexion to 100 degrees, IR to 5 degrees and ER to 30 degrees Baseline: 85, 0 and 19 respectively Goal status: INITIAL     LONG TERM GOALS: Target date: 12/21/2022   Improve FOTO to 66 in 12 visits Baseline: 52 Goal status: INITIAL   2.  Amaliya will report right hip, gluteal and groin pain consistently 0-3/10 on the Visual Analog Scale  (VAS). Baseline: 2-6/10 Goal status: On Going 11/08/2022   3.  Improve right hip ER to 35 degrees Baseline: 19 degrees Goal status: INITIAL   4.  Improve right hip abductors strength to 4+/5 MMT or better Baseline: Deferred due to pain for MMT assessment but decreased stance time and lateral lean noted with gait Goal status: On Going 11/08/2022   5.  Jamera will be independent with her long-term HEP at [place] Baseline: Started 10/26/2022 Goal status: On Going 11/08/2022   PLAN:   PT FREQUENCY: 1-2x/week   PT DURATION: 8 weeks   PLANNED INTERVENTIONS: Therapeutic exercises, Therapeutic activity, Neuromuscular re-education, Balance training, Gait training, Patient/Family education, Self Care, Stair training, Dry Needling, Cryotherapy, Moist heat, and Manual therapy   PLAN FOR NEXT SESSION: Appropriately progress hip strength while avoiding overuse.  Nu Step?  Leg Press?  Revisit balance.      Farley Ly, PT, MPT 11/08/2022, 5:44 PM

## 2022-11-09 ENCOUNTER — Ambulatory Visit
Admission: RE | Admit: 2022-11-09 | Discharge: 2022-11-09 | Disposition: A | Discharge status: Home / Self Care | Payer: Medicare HMO | Source: Ambulatory Visit | Attending: Family Medicine | Admitting: Family Medicine

## 2022-11-09 DIAGNOSIS — M85851 Other specified disorders of bone density and structure, right thigh: Secondary | ICD-10-CM | POA: Diagnosis not present

## 2022-11-09 DIAGNOSIS — Z780 Asymptomatic menopausal state: Secondary | ICD-10-CM | POA: Diagnosis not present

## 2022-11-09 DIAGNOSIS — M810 Age-related osteoporosis without current pathological fracture: Secondary | ICD-10-CM | POA: Diagnosis not present

## 2022-11-10 ENCOUNTER — Ambulatory Visit: Payer: Medicare HMO | Admitting: Rehabilitative and Restorative Service Providers"

## 2022-11-10 ENCOUNTER — Encounter: Payer: Self-pay | Admitting: Rehabilitative and Restorative Service Providers"

## 2022-11-10 DIAGNOSIS — M25551 Pain in right hip: Secondary | ICD-10-CM

## 2022-11-10 DIAGNOSIS — M6281 Muscle weakness (generalized): Secondary | ICD-10-CM | POA: Diagnosis not present

## 2022-11-10 DIAGNOSIS — R262 Difficulty in walking, not elsewhere classified: Secondary | ICD-10-CM | POA: Diagnosis not present

## 2022-11-10 DIAGNOSIS — M25651 Stiffness of right hip, not elsewhere classified: Secondary | ICD-10-CM

## 2022-11-10 NOTE — Therapy (Signed)
OUTPATIENT PHYSICAL THERAPY TREATMENT NOTE   Patient Name: [name] MRN: [id number] DOB:03-Jul-1941, 82 y.o., female Today's Date: 11/10/2022  END OF SESSION:   PT End of Session - 11/10/22 1345     Visit Number 4    Number of Visits 12    Date for PT Re-Evaluation 12/21/22    Progress Note Due on Visit 10    PT Start Time 1344    PT Stop Time 1430    PT Time Calculation (min) 46 min    Activity Tolerance Patient tolerated treatment well;Patient limited by pain;No increased pain    Behavior During Therapy WFL for tasks assessed/performed              Past Medical History:  Diagnosis Date   Back pain    Chronic constipation    past hx- no current constipation 02-2021   Hyperlipidemia    Hypertension    Multiple gastric ulcers    Osteopenia    Single kidney    donated kidney    Past Surgical History:  Procedure Laterality Date   CLEFT PALATE REPAIR     COLONOSCOPY     FOOT NEUROMA SURGERY  2/06   NEPHRECTOMY LIVING DONOR     Left   TUBAL LIGATION     UPPER GASTROINTESTINAL ENDOSCOPY     Patient Active Problem List   Diagnosis Date Noted   Right groin pain 10/10/2022   Current use of proton pump inhibitor 04/18/2022   Decreased GFR 04/18/2022   Vitamin B12 deficiency 04/18/2022   Multiple gastric ulcers 04/27/2021   Effusion of right knee joint 05/31/2020   Pain in right knee 05/31/2020   Pain in joint of left shoulder 05/20/2020   Pain in right foot 04/08/2020   Closed fracture of shaft of right fibula 01/08/2020   Pain in joint of right ankle 01/08/2020   Hematoma of left breast 01/02/2020   Carpal tunnel syndrome 05/27/2018   Pain in finger of right hand 04/24/2018   Pain in joint of right foot 04/24/2018   Hyperkalemia 02/01/2018   Encounter for screening mammogram for breast cancer 01/19/2017   Estrogen deficiency 01/19/2017   Routine general medical examination at a health care facility 06/11/2015   Encounter for Medicare annual wellness  exam 11/14/2013   Family history of colon cancer 11/14/2013   Colon cancer screening 11/14/2013   Hypertension 01/27/2011   Osteoporosis 01/27/2011   Post-menopausal 01/27/2011   Low back pain radiating to left leg 03/13/2008   Hyperlipidemia 12/20/2007   CONSTIPATION, CHRONIC 12/20/2007   Prediabetes 12/20/2007     THERAPY DIAG:  Difficulty in walking, not elsewhere classified  Muscle weakness (generalized)  Stiffness of right hip, not elsewhere classified  Pain in right hip  PCP: Abner Greenspan, MD   REFERRING PROVIDER: Gregor Hams, MD   REFERRING DIAG:  Diagnosis  M25.551 (ICD-10-CM) - Right hip pain  M70.61 (ICD-10-CM) - Trochanteric bursitis of right hip      THERAPY DIAG:  Difficulty in walking, not elsewhere classified   Muscle weakness (generalized)   Stiffness of right hip, not elsewhere classified   Pain in right hip   Rationale for Evaluation and Treatment: Rehabilitation   ONSET DATE: Early December 2023   SUBJECTIVE:    SUBJECTIVE STATEMENT: [name] reports continued HEP compliance.  She still has limited standing and weightbearing function secondary to symptoms with overuse.   PERTINENT HISTORY: HLD, HTN, osteopenia, 1 kidney, previous foot neuroma surgery, previous Rt fibular  fracture, pre-diabetes PAIN:  Are you having pain? Yes: NPRS scale: 2-4/10 this week on a 10/10 Pain location: Right lateral and posterior-lateral hip Pain description: Ache, grabbing Aggravating factors: Prolonged postures, sit to stand particularly after prolonged sitting Relieving factors: Topical willow balm   PRECAUTIONS: None   WEIGHT BEARING RESTRICTIONS: No   FALLS:  Has patient fallen in last 6 months? No   LIVING ENVIRONMENT: Lives with: lives with their spouse Lives in: House/apartment Stairs:  Needs handrail Has following equipment at home: Single point cane but doesn't need it   OCCUPATION: Retired   PLOF: Independent   PATIENT GOALS: Return  to playing with great grandchildren and return to walking   NEXT MD VISIT: ?   OBJECTIVE:    DIAGNOSTIC FINDINGS: FINDINGS: Pelvic ring is intact. Degenerative changes of lumbar spine are seen. No acute fracture or dislocation is noted. No soft tissue abnormality is seen.   IMPRESSION: No acute abnormality noted.   PATIENT SURVEYS:  FOTO 52 (Goal 66 in 12 visits)   COGNITION: Overall cognitive status: Within functional limits for tasks assessed                         SENSATION: No tingling or paresthesias noted     MUSCLE LENGTH: Hamstrings: Right 45 deg; Left 50 deg     LOWER EXTREMITY ROM:   Passive ROM Left/Right 10/26/2022 Measure week of 11/13/2022  Hip flexion 100/85    Hip extension      Hip abduction      Hip adduction      Hip internal rotation 2/0    Hip external rotation 26/19    Knee flexion      Knee extension      Ankle dorsiflexion      Ankle plantarflexion      Ankle inversion      Ankle eversion       (Blank rows = not tested)   LOWER EXTREMITY MMT:   Strength 10/26/2022    Hip flexion Strength deferred at eval due to pain    Hip extension      Hip abduction      Hip adduction      Hip internal rotation      Hip external rotation      Knee flexion      Knee extension      Ankle dorsiflexion      Ankle plantarflexion      Ankle inversion      Ankle eversion       (Blank rows = not tested)   GAIT: Distance walked: In the clinic Assistive device utilized: None Level of assistance: Complete Independence Comments: Decreased stance time Right with Right lateral lean     TODAY'S TREATMENT:  DATE:  11/10/2022 Single knee to chest stretch (other leg straight) 4X 20 seconds Gluteal stretch 4X 20 seconds Figure 4 stretch supine 4X 20 seconds Clams (lie left, lift right) 10X with red theraband resistance Side lie hip  abduction (lie left, lift right, 1/4 turn to stomach) 10X 3 seconds Prone alternating hip extensions 10 for 3 seconds Alternating hip hike 10X 3 seconds  Neuromuscular re-education: Tandem balance 4X 20 seconds Bil (took a seated break for a minute between repetitions 2 and 3)  Functional Activities: Body mechanics for getting firewood out of the woods, into her Gator and into the wood stove that heats her home   11/08/2022 Single knee to chest stretch (other leg straight) 4X 20 seconds Gluteal stretch 4X 20 seconds Figure 4 stretch supine 4X 20 seconds Clams (lie left, lift right) 2 sets of 10X with red theraband resistance Side lie hip abduction (lie left, lift right, 1/4 turn to stomach) 2 sets 10X 3 seconds Prone alternating hip extensions 2 sets of 10 for 3 seconds Alternating hip hike 10X 3 seconds  Neuromuscular re-education: Tandem balance 4X 20 seconds Bil   11/02/2022 Single knee to chest stretch (other leg straight) 5X 20 seconds Gluteal stretch 5X 20 seconds Figure 4 stretch supine 5X 20 seconds Clams (lie left, lift right) 10X no resistance and 10X with red theraband resistance Side lie hip abduction (lie left, lift right, 1/4 turn to stomach) 10X 3 seconds Alternating hip hike 10X 3 seconds and hip hike, hike left side only 10X 3 seconds  Neuromuscular re-education: Tandem balance 1X 20 seconds Bil (had to stop due to discomfort)   PATIENT EDUCATION:  Education details: Reviewed HEP and exam findings Person educated: Patient Education method: Explanation, Demonstration, Tactile cues, Verbal cues, and Handouts Education comprehension: verbalized understanding, returned demonstration, verbal cues required, tactile cues required, and needs further education   HOME EXERCISE PROGRAM: Access Code: [email or web address] URL: [email or web address] Date: 11/08/2022 Prepared by: Vista Mink  Exercises - Supine Gluteus Stretch  - 2 x daily - 7 x weekly - 1 sets - 5  reps - 20 seconds hold - Supine Figure 4 Piriformis Stretch  - 2 x daily - 7 x weekly - 1 sets - 5 reps - 20 seconds hold - Standing Hip Hiking  - 3-5 x daily - 3-7 x weekly - 1 sets - 10 reps - 3 seconds hold - Clam with Resistance  - 2 x daily - 3-7 x weekly - 2-3 sets - 10 reps - 3 seconds hold - Single Knee to Chest Stretch  - 2 x daily - 7 x weekly - 1 sets - 5 reps - 20 seconds hold - Sidelying Hip Abduction  - 1 x daily - 3-7 x weekly - 2 sets - 10 reps - 5 seconds hold - Prone Hip Extension  - 1 x daily - 3-7 x weekly - 2 sets - 10 reps - 3 seconds hold    ASSESSMENT:   CLINICAL IMPRESSION: Lahoma reports continued good home exercise program compliance.  She is still quite limited with her weight-bearing endurance due to significant right-sided hip weakness.  Appropriate lower extremity strengthening, core strengthening, body mechanics work and particularly hip abductors strengthening will be the focus of continued rehabilitation.  OBJECTIVE IMPAIRMENTS: Abnormal gait, decreased activity tolerance, decreased endurance, decreased knowledge of condition, difficulty walking, decreased ROM, decreased strength, increased edema, impaired perceived functional ability, increased muscle spasms, impaired flexibility, and pain.    ACTIVITY LIMITATIONS: carrying, lifting, bending, sitting,  standing, squatting, sleeping, stairs, transfers, bed mobility, and locomotion level   PARTICIPATION LIMITATIONS: community activity   PERSONAL FACTORS: HLD, HTN, osteopenia, 1 kidney, previous foot neuroma surgery, previous Rt fibular fracture, pre-diabetes are also affecting patient's functional outcome.    REHAB POTENTIAL: Good   CLINICAL DECISION MAKING: Stable/uncomplicated   EVALUATION COMPLEXITY: Low     GOALS: Goals reviewed with patient? Yes   SHORT TERM GOALS: Target date: 11/23/2022 Wafaa will be independent with her day 1 HEP Baseline: Started 10/26/2022 Goal status: On Going 11/10/2022    2.  Improve right hip flexion to 100 degrees, IR to 5 degrees and ER to 30 degrees Baseline: 85, 0 and 19 respectively Goal status: INITIAL     LONG TERM GOALS: Target date: 12/21/2022   Improve FOTO to 66 in 12 visits Baseline: 52 Goal status: INITIAL   2.  Aiman will report right hip, gluteal and groin pain consistently 0-3/10 on the Visual Analog Scale (VAS). Baseline: 2-6/10 Goal status: On Going 11/10/2022   3.  Improve right hip ER to 35 degrees Baseline: 19 degrees Goal status: INITIAL   4.  Improve right hip abductors strength to 4+/5 MMT or better Baseline: Deferred due to pain for MMT assessment but decreased stance time and lateral lean noted with gait Goal status: On Going 11/10/2022   5.  Milka will be independent with her long-term HEP at [place] Baseline: Started 10/26/2022 Goal status: On Going 11/10/2022   PLAN:   PT FREQUENCY: 1-2x/week   PT DURATION: 8 weeks   PLANNED INTERVENTIONS: Therapeutic exercises, Therapeutic activity, Neuromuscular re-education, Balance training, Gait training, Patient/Family education, Self Care, Stair training, Dry Needling, Cryotherapy, Moist heat, and Manual therapy   PLAN FOR NEXT SESSION: Appropriately progress hip strength while avoiding overuse.  Nu Step?  Leg Press single-leg?      Farley Ly, PT, MPT 11/10/2022, 3:38 PM

## 2022-11-15 ENCOUNTER — Encounter: Payer: Self-pay | Admitting: Rehabilitative and Restorative Service Providers"

## 2022-11-15 ENCOUNTER — Ambulatory Visit: Payer: Medicare HMO | Admitting: Rehabilitative and Restorative Service Providers"

## 2022-11-15 DIAGNOSIS — R262 Difficulty in walking, not elsewhere classified: Secondary | ICD-10-CM | POA: Diagnosis not present

## 2022-11-15 DIAGNOSIS — M25551 Pain in right hip: Secondary | ICD-10-CM | POA: Diagnosis not present

## 2022-11-15 DIAGNOSIS — M25651 Stiffness of right hip, not elsewhere classified: Secondary | ICD-10-CM | POA: Diagnosis not present

## 2022-11-15 DIAGNOSIS — M6281 Muscle weakness (generalized): Secondary | ICD-10-CM | POA: Diagnosis not present

## 2022-11-15 NOTE — Therapy (Signed)
OUTPATIENT PHYSICAL THERAPY TREATMENT NOTE   Patient Name: [name] MRN: [id number] DOB:October 23, 1940, 82 y.o., female Today's Date: 11/15/2022  END OF SESSION:   PT End of Session - 11/15/22 1428     Visit Number 5    Number of Visits 12    Date for PT Re-Evaluation 12/21/22    Progress Note Due on Visit 10    PT Start Time 3810    PT Stop Time 1512    PT Time Calculation (min) 44 min    Activity Tolerance Patient tolerated treatment well;No increased pain;Patient limited by fatigue    Behavior During Therapy [name] for tasks assessed/performed               Past Medical History:  Diagnosis Date   Back pain    Chronic constipation    past hx- no current constipation 02-2021   Hyperlipidemia    Hypertension    Multiple gastric ulcers    Osteopenia    Single kidney    donated kidney    Past Surgical History:  Procedure Laterality Date   CLEFT PALATE REPAIR     COLONOSCOPY     FOOT NEUROMA SURGERY  2/06   NEPHRECTOMY LIVING DONOR     Left   TUBAL LIGATION     UPPER GASTROINTESTINAL ENDOSCOPY     Patient Active Problem List   Diagnosis Date Noted   Right groin pain 10/10/2022   Current use of proton pump inhibitor 04/18/2022   Decreased GFR 04/18/2022   Vitamin B12 deficiency 04/18/2022   Multiple gastric ulcers 04/27/2021   Effusion of right knee joint 05/31/2020   Pain in right knee 05/31/2020   Pain in joint of left shoulder 05/20/2020   Pain in right foot 04/08/2020   Closed fracture of shaft of right fibula 01/08/2020   Pain in joint of right ankle 01/08/2020   Hematoma of left breast 01/02/2020   Carpal tunnel syndrome 05/27/2018   Pain in finger of right hand 04/24/2018   Pain in joint of right foot 04/24/2018   Hyperkalemia 02/01/2018   Encounter for screening mammogram for breast cancer 01/19/2017   Estrogen deficiency 01/19/2017   Routine general medical examination at a health care facility 06/11/2015   Encounter for Medicare annual  wellness exam 11/14/2013   Family history of colon cancer 11/14/2013   Colon cancer screening 11/14/2013   Hypertension 01/27/2011   Osteoporosis 01/27/2011   Post-menopausal 01/27/2011   Low back pain radiating to left leg 03/13/2008   Hyperlipidemia 12/20/2007   CONSTIPATION, CHRONIC 12/20/2007   Prediabetes 12/20/2007     THERAPY DIAG:  Difficulty in walking, not elsewhere classified  Muscle weakness (generalized)  Stiffness of right hip, not elsewhere classified  Pain in right hip  PCP: Abner Greenspan, MD   REFERRING PROVIDER: Gregor Hams, MD   REFERRING DIAG:  Diagnosis  M25.551 (ICD-10-CM) - Right hip pain  M70.61 (ICD-10-CM) - Trochanteric bursitis of right hip      THERAPY DIAG:  Difficulty in walking, not elsewhere classified   Muscle weakness (generalized)   Stiffness of right hip, not elsewhere classified   Pain in right hip   Rationale for Evaluation and Treatment: Rehabilitation   ONSET DATE: Early December 2023   SUBJECTIVE:    SUBJECTIVE STATEMENT: Analiya reports "definite progress" since starting PT.  Standing and weightbearing endurance have improved and are still limited with overuse.   PERTINENT HISTORY: HLD, HTN, osteopenia, 1 kidney, previous foot neuroma surgery, previous  Rt fibular fracture, pre-diabetes PAIN:  Are you having pain? Yes: NPRS scale: 1-4/10 this week on a 10/10 Pain location: Right lateral and posterior-lateral hip Pain description: Ache, grabbing Aggravating factors: Prolonged postures, sit to stand particularly after prolonged sitting Relieving factors: Topical willow balm, ice   PRECAUTIONS: None   WEIGHT BEARING RESTRICTIONS: No   FALLS:  Has patient fallen in last 6 months? No   LIVING ENVIRONMENT: Lives with: lives with their spouse Lives in: House/apartment Stairs:  Needs handrail Has following equipment at home: Single point cane but doesn't need it   OCCUPATION: Retired   PLOF: Independent    PATIENT GOALS: Return to playing with great grandchildren and return to walking   NEXT MD VISIT: ?   OBJECTIVE:    DIAGNOSTIC FINDINGS: FINDINGS: Pelvic ring is intact. Degenerative changes of lumbar spine are seen. No acute fracture or dislocation is noted. No soft tissue abnormality is seen.   IMPRESSION: No acute abnormality noted.   PATIENT SURVEYS:  10/26/2022 FOTO 52 (Goal 66 in 12 visits)   COGNITION: Overall cognitive status: Within functional limits for tasks assessed                         SENSATION: No tingling or paresthesias noted     MUSCLE LENGTH: 11/15/2022 Hamstrings: Right 55 deg; Left 60 deg 10/26/2022 Hamstrings: Right 45 deg; Left 50 deg     LOWER EXTREMITY ROM:   Passive ROM Left/Right 10/26/2022 Left/Right 11/15/2022  Hip flexion 100/85 110/105  Hip extension      Hip abduction      Hip adduction      Hip internal rotation 2/0  9/6  Hip external rotation 26/19  31/40  Knee flexion      Knee extension      Ankle dorsiflexion      Ankle plantarflexion      Ankle inversion      Ankle eversion       (Blank rows = not tested)   LOWER EXTREMITY MMT:   Strength 10/26/2022  11/15/2022 Left/Right  Hip flexion Strength deferred at eval due to pain    Hip extension      Hip abduction    4+/4- out of 5  Hip adduction      Hip internal rotation      Hip external rotation      Knee flexion      Knee extension      Ankle dorsiflexion      Ankle plantarflexion      Ankle inversion      Ankle eversion       (Blank rows = not tested)   GAIT: Distance walked: In the clinic Assistive device utilized: None Level of assistance: Complete Independence Comments: Decreased stance time Right with Right lateral lean     TODAY'S TREATMENT:                                             DATE:  11/15/2022 Single knee to chest stretch (other leg straight) 4X 20 seconds Gluteal stretch 4X 20 seconds Figure 4 stretch supine 4X 20 seconds Clams (lie left, lift  right) 10X with red theraband resistance Prone alternating hip extensions 10 for 3 seconds Alternating hip hike 10X 3 seconds  Neuromuscular re-education: Tandem balance 3X 20 seconds Bil (no break taken)  Functional Activities: Double leg press 75# 2 sets of 10 with slow eccentrics 75# Single leg press 37# and 50# 10X each   11/10/2022 Single knee to chest stretch (other leg straight) 4X 20 seconds Gluteal stretch 4X 20 seconds Figure 4 stretch supine 4X 20 seconds Clams (lie left, lift right) 10X with red theraband resistance Side lie hip abduction (lie left, lift right, 1/4 turn to stomach) 10X 3 seconds Prone alternating hip extensions 10 for 3 seconds Alternating hip hike 10X 3 seconds  Neuromuscular re-education: Tandem balance 4X 20 seconds Bil (took a seated break for a minute between repetitions 2 and 3)  Functional Activities: Body mechanics for getting firewood out of the woods, into her Gator and into the wood stove that heats her home   11/08/2022 Single knee to chest stretch (other leg straight) 4X 20 seconds Gluteal stretch 4X 20 seconds Figure 4 stretch supine 4X 20 seconds Clams (lie left, lift right) 2 sets of 10X with red theraband resistance Side lie hip abduction (lie left, lift right, 1/4 turn to stomach) 2 sets 10X 3 seconds Prone alternating hip extensions 2 sets of 10 for 3 seconds Alternating hip hike 10X 3 seconds  Neuromuscular re-education: Tandem balance 4X 20 seconds Bil   PATIENT EDUCATION:  Education details: Reviewed HEP and exam findings Person educated: Patient Education method: Explanation, Demonstration, Tactile cues, Verbal cues, and Handouts Education comprehension: verbalized understanding, returned demonstration, verbal cues required, tactile cues required, and needs further education   HOME EXERCISE PROGRAM: Access Code: [email or web address] URL: [email or web address] Date: 11/08/2022 Prepared by: Vista Mink  Exercises - Supine Gluteus Stretch  - 2 x daily - 7 x weekly - 1 sets - 5 reps - 20 seconds hold - Supine Figure 4 Piriformis Stretch  - 2 x daily - 7 x weekly - 1 sets - 5 reps - 20 seconds hold - Standing Hip Hiking  - 3-5 x daily - 3-7 x weekly - 1 sets - 10 reps - 3 seconds hold - Clam with Resistance  - 2 x daily - 3-7 x weekly - 2-3 sets - 10 reps - 3 seconds hold - Single Knee to Chest Stretch  - 2 x daily - 7 x weekly - 1 sets - 5 reps - 20 seconds hold - Sidelying Hip Abduction  - 1 x daily - 3-7 x weekly - 2 sets - 10 reps - 5 seconds hold - Prone Hip Extension  - 1 x daily - 3-7 x weekly - 2 sets - 10 reps - 3 seconds hold    ASSESSMENT:   CLINICAL IMPRESSION: Leah reports noticeable progress with her right hip/gluteal pain since starting PT.  Weight-bearing endurance is improving and is still limited due to significant right-sided hip weakness.  Appropriate lower extremity strengthening, core strengthening, body mechanics work and particularly hip abductors strengthening will be the focus of continued rehabilitation.  OBJECTIVE IMPAIRMENTS: Abnormal gait, decreased activity tolerance, decreased endurance, decreased knowledge of condition, difficulty walking, decreased ROM, decreased strength, increased edema, impaired perceived functional ability, increased muscle spasms, impaired flexibility, and pain.    ACTIVITY LIMITATIONS: carrying, lifting, bending, sitting, standing, squatting, sleeping, stairs, transfers, bed mobility, and locomotion level   PARTICIPATION LIMITATIONS: community activity   PERSONAL FACTORS: HLD, HTN, osteopenia, 1 kidney, previous foot neuroma surgery, previous Rt fibular fracture, pre-diabetes are also affecting patient's functional outcome.    REHAB POTENTIAL: Good   CLINICAL DECISION MAKING: Stable/uncomplicated   EVALUATION COMPLEXITY: Low     GOALS:  Goals reviewed with patient? Yes   SHORT TERM GOALS: Target date: 11/23/2022 Dajah  will be independent with her day 1 HEP Baseline: Started 10/26/2022 Goal status: Met 11/15/2022   2.  Improve right hip flexion to 100 degrees, IR to 5 degrees and ER to 30 degrees Baseline: 85, 0 and 19 respectively Goal status: Met 11/15/2022     LONG TERM GOALS: Target date: 12/21/2022   Improve FOTO to 66 in 12 visits Baseline: 52 Goal status: INITIAL   2.  Neliah will report right hip, gluteal and groin pain consistently 0-3/10 on the Visual Analog Scale (VAS). Baseline: 2-6/10 Goal status: On Going 11/15/2022   3.  Improve right hip ER to 35 degrees Baseline: 19 degrees Goal status: Met 11/15/2022   4.  Improve right hip abductors strength to 4+/5 MMT or better Baseline: Deferred due to pain for MMT assessment but decreased stance time and lateral lean noted with gait Goal status: On Going 11/15/2022   5.  Shaylie will be independent with her long-term HEP at [place] Baseline: Started 10/26/2022 Goal status: On Going 11/15/2022   PLAN:   PT FREQUENCY: 1-2x/week   PT DURATION: 8 weeks   PLANNED INTERVENTIONS: Therapeutic exercises, Therapeutic activity, Neuromuscular re-education, Balance training, Gait training, Patient/Family education, Self Care, Stair training, Dry Needling, Cryotherapy, Moist heat, and Manual therapy   PLAN FOR NEXT SESSION: Appropriately progress hip strength while avoiding overuse.  Nu Step?  FOTO and progress note to Dr. Georgina Snell next visit.  Objective measures taken already 11/15/2022.  Anticipate 2-4 more weeks of supervised PT.   Farley Ly, PT, MPT 11/15/2022, 3:21 PM

## 2022-11-17 ENCOUNTER — Ambulatory Visit: Payer: Medicare HMO | Admitting: Rehabilitative and Restorative Service Providers"

## 2022-11-17 ENCOUNTER — Encounter: Payer: Self-pay | Admitting: Rehabilitative and Restorative Service Providers"

## 2022-11-17 DIAGNOSIS — M25651 Stiffness of right hip, not elsewhere classified: Secondary | ICD-10-CM | POA: Diagnosis not present

## 2022-11-17 DIAGNOSIS — M25551 Pain in right hip: Secondary | ICD-10-CM

## 2022-11-17 DIAGNOSIS — M6281 Muscle weakness (generalized): Secondary | ICD-10-CM | POA: Diagnosis not present

## 2022-11-17 DIAGNOSIS — R262 Difficulty in walking, not elsewhere classified: Secondary | ICD-10-CM

## 2022-11-17 NOTE — Therapy (Signed)
OUTPATIENT PHYSICAL THERAPY TREATMENT/PROGRESS NOTE   Patient Name: [name] MRN: [id number] DOB:10-Feb-1941, 82 y.o., female Today's Date: 11/17/2022  Progress Note Reporting Period 10/26/2022 to 11/17/2022  See note below for Objective Data and Assessment of Progress/Goals.      END OF SESSION:   PT End of Session - 11/17/22 1347     Visit Number 6    Number of Visits 12    Date for PT Re-Evaluation 12/21/22    Progress Note Due on Visit 10    PT Start Time 1344    PT Stop Time 1425    PT Time Calculation (min) 41 min    Activity Tolerance Patient tolerated treatment well;No increased pain;Patient limited by fatigue    Behavior During Therapy [facility] for tasks assessed/performed                Past Medical History:  Diagnosis Date   Back pain    Chronic constipation    past hx- no current constipation 02-2021   Hyperlipidemia    Hypertension    Multiple gastric ulcers    Osteopenia    Single kidney    donated kidney    Past Surgical History:  Procedure Laterality Date   CLEFT PALATE REPAIR     COLONOSCOPY     FOOT NEUROMA SURGERY  2/06   NEPHRECTOMY LIVING DONOR     Left   TUBAL LIGATION     UPPER GASTROINTESTINAL ENDOSCOPY     Patient Active Problem List   Diagnosis Date Noted   Right groin pain 10/10/2022   Current use of proton pump inhibitor 04/18/2022   Decreased GFR 04/18/2022   Vitamin B12 deficiency 04/18/2022   Multiple gastric ulcers 04/27/2021   Effusion of right knee joint 05/31/2020   Pain in right knee 05/31/2020   Pain in joint of left shoulder 05/20/2020   Pain in right foot 04/08/2020   Closed fracture of shaft of right fibula 01/08/2020   Pain in joint of right ankle 01/08/2020   Hematoma of left breast 01/02/2020   Carpal tunnel syndrome 05/27/2018   Pain in finger of right hand 04/24/2018   Pain in joint of right foot 04/24/2018   Hyperkalemia 02/01/2018   Encounter for screening mammogram for breast cancer 01/19/2017    Estrogen deficiency 01/19/2017   Routine general medical examination at a health care facility 06/11/2015   Encounter for Medicare annual wellness exam 11/14/2013   Family history of colon cancer 11/14/2013   Colon cancer screening 11/14/2013   Hypertension 01/27/2011   Osteoporosis 01/27/2011   Post-menopausal 01/27/2011   Low back pain radiating to left leg 03/13/2008   Hyperlipidemia 12/20/2007   CONSTIPATION, CHRONIC 12/20/2007   Prediabetes 12/20/2007     THERAPY DIAG:  Difficulty in walking, not elsewhere classified  Muscle weakness (generalized)  Stiffness of right hip, not elsewhere classified  Pain in right hip  PCP: Abner Greenspan, MD   REFERRING PROVIDER: Gregor Hams, MD   REFERRING DIAG:  Diagnosis  M25.551 (ICD-10-CM) - Right hip pain  M70.61 (ICD-10-CM) - Trochanteric bursitis of right hip      THERAPY DIAG:  Difficulty in walking, not elsewhere classified   Muscle weakness (generalized)   Stiffness of right hip, not elsewhere classified   Pain in right hip   Rationale for Evaluation and Treatment: Rehabilitation   ONSET DATE: Early December 2023   SUBJECTIVE:    SUBJECTIVE STATEMENT: [name] is happy with her early PT progress.  Standing  and weightbearing endurance have improved and are limited with overuse.   PERTINENT HISTORY: HLD, HTN, osteopenia, 1 kidney, previous foot neuroma surgery, previous Rt fibular fracture, pre-diabetes PAIN:  Are you having pain? Yes: NPRS scale: 1-4/10 this week on a 10/10 Pain location: Right lateral and posterior-lateral hip Pain description: Ache, grabbing Aggravating factors: Prolonged postures, sit to stand particularly after prolonged sitting Relieving factors: Topical willow balm, ice   PRECAUTIONS: None   WEIGHT BEARING RESTRICTIONS: No   FALLS:  Has patient fallen in last 6 months? No   LIVING ENVIRONMENT: Lives with: lives with their spouse Lives in: House/apartment Stairs:  Needs  handrail Has following equipment at home: Single point cane but doesn't need it   OCCUPATION: Retired   PLOF: Independent   PATIENT GOALS: Return to playing with great grandchildren and return to walking   NEXT MD VISIT: ?   OBJECTIVE:    DIAGNOSTIC FINDINGS: FINDINGS: Pelvic ring is intact. Degenerative changes of lumbar spine are seen. No acute fracture or dislocation is noted. No soft tissue abnormality is seen.   IMPRESSION: No acute abnormality noted.   PATIENT SURVEYS:  11/17/2022 FOTO 67 (Goal met)  10/26/2022 FOTO 52 (Goal 66 in 12 visits)   COGNITION: Overall cognitive status: Within functional limits for tasks assessed                         SENSATION: No tingling or paresthesias noted     MUSCLE LENGTH: 11/15/2022 Hamstrings: Right 55 deg; Left 60 deg 10/26/2022 Hamstrings: Right 45 deg; Left 50 deg     LOWER EXTREMITY ROM:   Passive ROM Left/Right 10/26/2022 Left/Right 11/15/2022  Hip flexion 100/85 110/105  Hip extension      Hip abduction      Hip adduction      Hip internal rotation 2/0  9/6  Hip external rotation 26/19  31/40  Knee flexion      Knee extension      Ankle dorsiflexion      Ankle plantarflexion      Ankle inversion      Ankle eversion       (Blank rows = not tested)   LOWER EXTREMITY MMT:   Strength 10/26/2022  11/15/2022 Left/Right  Hip flexion Strength deferred at eval due to pain    Hip extension      Hip abduction    4+/4- out of 5  Hip adduction      Hip internal rotation      Hip external rotation      Knee flexion      Knee extension      Ankle dorsiflexion      Ankle plantarflexion      Ankle inversion      Ankle eversion       (Blank rows = not tested)   GAIT: Distance walked: In the clinic Assistive device utilized: None Level of assistance: Complete Independence Comments: Decreased stance time Right with Right lateral lean     TODAY'S TREATMENT:                                             DATE:   11/17/2022 Single knee to chest stretch (other leg straight) 4X 20 seconds Gluteal stretch 4X 20 seconds Figure 4 stretch supine 4X 20 seconds Clams (lie left, lift right) 10X  with red theraband resistance Prone alternating hip extensions 2 sets of 10 for 3 seconds Alternating hip hike 10X 3 seconds  Neuromuscular re-education: Tandem balance 3X 20 seconds Bil (no break taken)  Functional Activities: Double leg press 75# 10 with slow eccentrics 75# Single leg press 37# and 50# 10X each Step-down off 4 inch step 10X slow eccentrics   11/15/2022 Single knee to chest stretch (other leg straight) 4X 20 seconds Gluteal stretch 4X 20 seconds Figure 4 stretch supine 4X 20 seconds Clams (lie left, lift right) 10X with red theraband resistance Prone alternating hip extensions 10 for 3 seconds Alternating hip hike 10X 3 seconds  Neuromuscular re-education: Tandem balance 3X 20 seconds Bil (no break taken)  Functional Activities: Double leg press 75# 2 sets of 10 with slow eccentrics 75# Single leg press 37# and 50# 10X each   11/10/2022 Single knee to chest stretch (other leg straight) 4X 20 seconds Gluteal stretch 4X 20 seconds Figure 4 stretch supine 4X 20 seconds Clams (lie left, lift right) 10X with red theraband resistance Side lie hip abduction (lie left, lift right, 1/4 turn to stomach) 10X 3 seconds Prone alternating hip extensions 10 for 3 seconds Alternating hip hike 10X 3 seconds  Neuromuscular re-education: Tandem balance 4X 20 seconds Bil (took a seated break for a minute between repetitions 2 and 3)  Functional Activities: Body mechanics for getting firewood out of the woods, into her Gator and into the wood stove that heats her home   PATIENT EDUCATION:  Education details: Reviewed HEP and exam findings Person educated: Patient Education method: Explanation, Demonstration, Tactile cues, Verbal cues, and Handouts Education comprehension: verbalized understanding,  returned demonstration, verbal cues required, tactile cues required, and needs further education   HOME EXERCISE PROGRAM: Access Code: [email or web address] URL: [email or web address] Date: 11/08/2022 Prepared by: Vista Mink  Exercises - Supine Gluteus Stretch  - 2 x daily - 7 x weekly - 1 sets - 5 reps - 20 seconds hold - Supine Figure 4 Piriformis Stretch  - 2 x daily - 7 x weekly - 1 sets - 5 reps - 20 seconds hold - Standing Hip Hiking  - 3-5 x daily - 3-7 x weekly - 1 sets - 10 reps - 3 seconds hold - Clam with Resistance  - 2 x daily - 3-7 x weekly - 2-3 sets - 10 reps - 3 seconds hold - Single Knee to Chest Stretch  - 2 x daily - 7 x weekly - 1 sets - 5 reps - 20 seconds hold - Sidelying Hip Abduction  - 1 x daily - 3-7 x weekly - 2 sets - 10 reps - 5 seconds hold - Prone Hip Extension  - 1 x daily - 3-7 x weekly - 2 sets - 10 reps - 3 seconds hold    ASSESSMENT:   CLINICAL IMPRESSION: Jnya reports progress with her right hip/gluteal pain and endurance since starting PT.  Pain and endurance still limit function due to significant right-sided hip weakness.  Appropriate lower extremity strengthening, core strengthening, body mechanics work and particularly hip abductors strengthening will be the focus of continued rehabilitation.  I anticipated Andre will be ready for independent rehabilitation in 2-4 weeks.  OBJECTIVE IMPAIRMENTS: Abnormal gait, decreased activity tolerance, decreased endurance, decreased knowledge of condition, difficulty walking, decreased ROM, decreased strength, increased edema, impaired perceived functional ability, increased muscle spasms, impaired flexibility, and pain.    ACTIVITY LIMITATIONS: carrying, lifting, bending, sitting, standing, squatting, sleeping, stairs, transfers, bed mobility, and  locomotion level   PARTICIPATION LIMITATIONS: community activity   PERSONAL FACTORS: HLD, HTN, osteopenia, 1 kidney, previous foot neuroma surgery, previous  Rt fibular fracture, pre-diabetes are also affecting patient's functional outcome.    REHAB POTENTIAL: Good   CLINICAL DECISION MAKING: Stable/uncomplicated   EVALUATION COMPLEXITY: Low     GOALS: Goals reviewed with patient? Yes   SHORT TERM GOALS: Target date: 11/23/2022 Clennie will be independent with her day 1 HEP Baseline: Started 10/26/2022 Goal status: Met 11/15/2022   2.  Improve right hip flexion to 100 degrees, IR to 5 degrees and ER to 30 degrees Baseline: 85, 0 and 19 respectively Goal status: Met 11/15/2022     LONG TERM GOALS: Target date: 12/21/2022   Improve FOTO to 66 in 12 visits Baseline: 52 Goal status: Met 11/17/2022   2.  Lewanna will report right hip, gluteal and groin pain consistently 0-3/10 on the Visual Analog Scale (VAS). Baseline: 2-6/10 Goal status: On Going 11/17/2022   3.  Improve right hip ER to 35 degrees Baseline: 19 degrees Goal status: Met 11/15/2022   4.  Improve right hip abductors strength to 4+/5 MMT or better Baseline: Deferred due to pain for MMT assessment but decreased stance time and lateral lean noted with gait Goal status: On Going 11/17/2022   5.  Whittley will be independent with her long-term HEP at [place] Baseline: Started 10/26/2022 Goal status: On Going 11/17/2022   PLAN:   PT FREQUENCY: 1-2x/week   PT DURATION: 8 weeks   PLANNED INTERVENTIONS: Therapeutic exercises, Therapeutic activity, Neuromuscular re-education, Balance training, Gait training, Patient/Family education, Self Care, Stair training, Dry Needling, Cryotherapy, Moist heat, and Manual therapy   PLAN FOR NEXT SESSION: Appropriately progress hip strength while avoiding overuse.  Nu Step?  Step-down, up and over.  Anticipate 2-4 more weeks of supervised PT.   Farley Ly, PT, MPT 11/17/2022, 2:34 PM

## 2022-11-22 ENCOUNTER — Encounter: Payer: Self-pay | Admitting: Rehabilitative and Restorative Service Providers"

## 2022-11-22 ENCOUNTER — Ambulatory Visit: Payer: Medicare HMO | Admitting: Rehabilitative and Restorative Service Providers"

## 2022-11-22 DIAGNOSIS — M25651 Stiffness of right hip, not elsewhere classified: Secondary | ICD-10-CM | POA: Diagnosis not present

## 2022-11-22 DIAGNOSIS — M6281 Muscle weakness (generalized): Secondary | ICD-10-CM

## 2022-11-22 DIAGNOSIS — R262 Difficulty in walking, not elsewhere classified: Secondary | ICD-10-CM

## 2022-11-22 DIAGNOSIS — M25551 Pain in right hip: Secondary | ICD-10-CM

## 2022-11-22 NOTE — Therapy (Signed)
OUTPATIENT PHYSICAL THERAPY TREATMENT NOTE   Patient Name: [name] MRN: [id number] DOB:1941-07-25, 82 y.o., female Today's Date: 11/22/2022   END OF SESSION:   PT End of Session - 11/22/22 1400     Visit Number 7    Number of Visits 12    Date for PT Re-Evaluation 12/21/22    Progress Note Due on Visit 10    PT Start Time [id number]    PT Stop Time 1426    PT Time Calculation (min) 41 min    Activity Tolerance Patient tolerated treatment well;No increased pain    Behavior During Therapy WFL for tasks assessed/performed              Past Medical History:  Diagnosis Date   Back pain    Chronic constipation    past hx- no current constipation 02-2021   Hyperlipidemia    Hypertension    Multiple gastric ulcers    Osteopenia    Single kidney    donated kidney    Past Surgical History:  Procedure Laterality Date   CLEFT PALATE REPAIR     COLONOSCOPY     FOOT NEUROMA SURGERY  2/06   NEPHRECTOMY LIVING DONOR     Left   TUBAL LIGATION     UPPER GASTROINTESTINAL ENDOSCOPY     Patient Active Problem List   Diagnosis Date Noted   Right groin pain 10/10/2022   Current use of proton pump inhibitor 04/18/2022   Decreased GFR 04/18/2022   Vitamin B12 deficiency 04/18/2022   Multiple gastric ulcers 04/27/2021   Effusion of right knee joint 05/31/2020   Pain in right knee 05/31/2020   Pain in joint of left shoulder 05/20/2020   Pain in right foot 04/08/2020   Closed fracture of shaft of right fibula 01/08/2020   Pain in joint of right ankle 01/08/2020   Hematoma of left breast 01/02/2020   Carpal tunnel syndrome 05/27/2018   Pain in finger of right hand 04/24/2018   Pain in joint of right foot 04/24/2018   Hyperkalemia 02/01/2018   Encounter for screening mammogram for breast cancer 01/19/2017   Estrogen deficiency 01/19/2017   Routine general medical examination at a health care facility 06/11/2015   Encounter for Medicare annual wellness exam 11/14/2013    Family history of colon cancer 11/14/2013   Colon cancer screening 11/14/2013   Hypertension 01/27/2011   Osteoporosis 01/27/2011   Post-menopausal 01/27/2011   Low back pain radiating to left leg 03/13/2008   Hyperlipidemia 12/20/2007   CONSTIPATION, CHRONIC 12/20/2007   Prediabetes 12/20/2007     THERAPY DIAG:  Difficulty in walking, not elsewhere classified  Muscle weakness (generalized)  Stiffness of right hip, not elsewhere classified  Pain in right hip  PCP: Abner Greenspan, MD   REFERRING PROVIDER: Gregor Hams, MD   REFERRING DIAG:  Diagnosis  M25.551 (ICD-10-CM) - Right hip pain  M70.61 (ICD-10-CM) - Trochanteric bursitis of right hip      THERAPY DIAG:  Difficulty in walking, not elsewhere classified   Muscle weakness (generalized)   Stiffness of right hip, not elsewhere classified   Pain in right hip   Rationale for Evaluation and Treatment: Rehabilitation   ONSET DATE: Early December 2023   SUBJECTIVE:    SUBJECTIVE STATEMENT: Lensey notes only taking 1 pain pill since her visit last week.  She was surprised how much work she did yesterday and that she wasn't hurting afterwards.  Standing and weightbearing endurance have improved and are  limited with overuse.   PERTINENT HISTORY: HLD, HTN, osteopenia, 1 kidney, previous foot neuroma surgery, previous Rt fibular fracture, pre-diabetes PAIN:  Are you having pain? Yes: NPRS scale: 1-4/10 this week on a 10/10 Pain location: Right lateral and posterior-lateral hip Pain description: Ache, grabbing Aggravating factors: Prolonged postures, sit to stand particularly after prolonged sitting Relieving factors: Topical willow balm, ice   PRECAUTIONS: None   WEIGHT BEARING RESTRICTIONS: No   FALLS:  Has patient fallen in last 6 months? No   LIVING ENVIRONMENT: Lives with: lives with their spouse Lives in: House/apartment Stairs:  Needs handrail Has following equipment at home: Single point cane but  doesn't need it   OCCUPATION: Retired   PLOF: Independent   PATIENT GOALS: Return to playing with great grandchildren and return to walking   NEXT MD VISIT: ?   OBJECTIVE:    DIAGNOSTIC FINDINGS: FINDINGS: Pelvic ring is intact. Degenerative changes of lumbar spine are seen. No acute fracture or dislocation is noted. No soft tissue abnormality is seen.   IMPRESSION: No acute abnormality noted.   PATIENT SURVEYS:  11/17/2022 FOTO 67 (Goal met)  10/26/2022 FOTO 52 (Goal 66 in 12 visits)   COGNITION: Overall cognitive status: Within functional limits for tasks assessed                         SENSATION: No tingling or paresthesias noted     MUSCLE LENGTH: 11/15/2022 Hamstrings: Right 55 deg; Left 60 deg 10/26/2022 Hamstrings: Right 45 deg; Left 50 deg     LOWER EXTREMITY ROM:   Passive ROM Left/Right 10/26/2022 Left/Right 11/15/2022  Hip flexion 100/85 110/105  Hip extension      Hip abduction      Hip adduction      Hip internal rotation 2/0  9/6  Hip external rotation 26/19  31/40  Knee flexion      Knee extension      Ankle dorsiflexion      Ankle plantarflexion      Ankle inversion      Ankle eversion       (Blank rows = not tested)   LOWER EXTREMITY MMT:   Strength 10/26/2022  11/15/2022 Left/Right  Hip flexion Strength deferred at eval due to pain    Hip extension      Hip abduction    4+/4- out of 5  Hip adduction      Hip internal rotation      Hip external rotation      Knee flexion      Knee extension      Ankle dorsiflexion      Ankle plantarflexion      Ankle inversion      Ankle eversion       (Blank rows = not tested)   GAIT: Distance walked: In the clinic Assistive device utilized: None Level of assistance: Complete Independence Comments: Decreased stance time Right with Right lateral lean     TODAY'S TREATMENT:                                             DATE:  11/22/2022 Single knee to chest stretch (other leg straight) 4X 20  seconds Gluteal stretch 4X 20 seconds Figure 4 stretch supine 4X 20 seconds Clams (lie left, lift right) 10X with red theraband resistance Prone alternating hip  extensions 2 sets of 10 for 3 seconds Alternating hip hike 10X 3 seconds  Neuromuscular re-education: Tandem balance 4X 20 seconds Bil (no break taken)  Functional Activities: Double leg press 75# 10 with slow eccentrics Single leg press 43# and 50# 10X each Step-down off 4 inch step 10X slow eccentrics   11/17/2022 Single knee to chest stretch (other leg straight) 4X 20 seconds Gluteal stretch 4X 20 seconds Figure 4 stretch supine 4X 20 seconds Clams (lie left, lift right) 10X with red theraband resistance Prone alternating hip extensions 2 sets of 10 for 3 seconds Alternating hip hike 10X 3 seconds  Neuromuscular re-education: Tandem balance 3X 20 seconds Bil (no break taken)  Functional Activities: Double leg press 75# 10 with slow eccentrics 75# Single leg press 37# and 50# 10X each Step-down off 4 inch step 10X slow eccentrics   11/15/2022 Single knee to chest stretch (other leg straight) 4X 20 seconds Gluteal stretch 4X 20 seconds Figure 4 stretch supine 4X 20 seconds Clams (lie left, lift right) 10X with red theraband resistance Prone alternating hip extensions 10 for 3 seconds Alternating hip hike 10X 3 seconds  Neuromuscular re-education: Tandem balance 3X 20 seconds Bil (no break taken)  Functional Activities: Double leg press 75# 2 sets of 10 with slow eccentrics 75# Single leg press 37# and 50# 10X each   PATIENT EDUCATION:  Education details: Reviewed HEP and exam findings Person educated: Patient Education method: Explanation, Demonstration, Tactile cues, Verbal cues, and Handouts Education comprehension: verbalized understanding, returned demonstration, verbal cues required, tactile cues required, and needs further education   HOME EXERCISE PROGRAM: Access Code: [email or web address] URL:  [email or web address] Date: 11/22/2022 Prepared by: Vista Mink  Exercises - Supine Gluteus Stretch  - 1-2 x daily - 7 x weekly - 1 sets - 5 reps - 20 seconds hold - Supine Figure 4 Piriformis Stretch  - 1-2 x daily - 7 x weekly - 1 sets - 5 reps - 20 seconds hold - Standing Hip Hiking  - 3-5 x daily - 3-4 x weekly - 1 sets - 10 reps - 3 seconds hold - Clam with Resistance  - 1 x daily - 3-4 x weekly - 2-3 sets - 10 reps - 3 seconds hold - Single Knee to Chest Stretch  - 1 x daily - 7 x weekly - 1 sets - 5 reps - 20 seconds hold - Sidelying Hip Abduction  - 1 x daily - 3-4 x weekly - 2 sets - 10 reps - 5 seconds hold - Prone Hip Extension  - 1 x daily - 3-4 x weekly - 2 sets - 10 reps - 3 seconds hold    ASSESSMENT:   CLINICAL IMPRESSION: Kaylea reports continued progress with her pain and weight-bearing function.  Strength, particularly hip abductors will benefit from another 1-4 weeks of supervised work with transfer into independent PT at that time (when goals are met and Guadelupe is independent with her HEP).  OBJECTIVE IMPAIRMENTS: Abnormal gait, decreased activity tolerance, decreased endurance, decreased knowledge of condition, difficulty walking, decreased ROM, decreased strength, increased edema, impaired perceived functional ability, increased muscle spasms, impaired flexibility, and pain.    ACTIVITY LIMITATIONS: carrying, lifting, bending, sitting, standing, squatting, sleeping, stairs, transfers, bed mobility, and locomotion level   PARTICIPATION LIMITATIONS: community activity   PERSONAL FACTORS: HLD, HTN, osteopenia, 1 kidney, previous foot neuroma surgery, previous Rt fibular fracture, pre-diabetes are also affecting patient's functional outcome.    REHAB POTENTIAL: Good   CLINICAL DECISION MAKING:  Stable/uncomplicated   EVALUATION COMPLEXITY: Low     GOALS: Goals reviewed with patient? Yes   SHORT TERM GOALS: Target date: 11/23/2022 Aamina will be  independent with her day 1 HEP Baseline: Started 10/26/2022 Goal status: Met 11/15/2022   2.  Improve right hip flexion to 100 degrees, IR to 5 degrees and ER to 30 degrees Baseline: 85, 0 and 19 respectively Goal status: Met 11/15/2022     LONG TERM GOALS: Target date: 12/21/2022   Improve FOTO to 66 in 12 visits Baseline: 52 Goal status: Met 11/17/2022   2.  Katilaya will report right hip, gluteal and groin pain consistently 0-3/10 on the Visual Analog Scale (VAS). Baseline: 2-6/10 Goal status: On Going 11/22/2022   3.  Improve right hip ER to 35 degrees Baseline: 19 degrees Goal status: Met 11/15/2022   4.  Improve right hip abductors strength to 4+/5 MMT or better Baseline: Deferred due to pain for MMT assessment but decreased stance time and lateral lean noted with gait Goal status: On Going 11/22/2022   5.  Neona will be independent with her long-term HEP at DC Baseline: Started 10/26/2022 Goal status: On Going 11/22/2022   PLAN:   PT FREQUENCY: 1-2x/week   PT DURATION: 8 weeks   PLANNED INTERVENTIONS: Therapeutic exercises, Therapeutic activity, Neuromuscular re-education, Balance training, Gait training, Patient/Family education, Self Care, Stair training, Dry Needling, Cryotherapy, Moist heat, and Manual therapy   PLAN FOR NEXT SESSION: Appropriately progress hip strength while avoiding overuse.  Nu Step?  Step-down, up and over.  Anticipate 1-4 more weeks of supervised PT.   Farley Ly, PT, MPT 11/22/2022, 2:35 PM

## 2022-11-23 DIAGNOSIS — D2262 Melanocytic nevi of left upper limb, including shoulder: Secondary | ICD-10-CM | POA: Diagnosis not present

## 2022-11-23 DIAGNOSIS — L918 Other hypertrophic disorders of the skin: Secondary | ICD-10-CM | POA: Diagnosis not present

## 2022-11-23 DIAGNOSIS — L814 Other melanin hyperpigmentation: Secondary | ICD-10-CM | POA: Diagnosis not present

## 2022-11-23 DIAGNOSIS — L570 Actinic keratosis: Secondary | ICD-10-CM | POA: Diagnosis not present

## 2022-11-23 DIAGNOSIS — L821 Other seborrheic keratosis: Secondary | ICD-10-CM | POA: Diagnosis not present

## 2022-11-24 ENCOUNTER — Encounter: Payer: Self-pay | Admitting: Rehabilitative and Restorative Service Providers"

## 2022-11-24 ENCOUNTER — Ambulatory Visit: Payer: Medicare HMO | Admitting: Rehabilitative and Restorative Service Providers"

## 2022-11-24 DIAGNOSIS — M25651 Stiffness of right hip, not elsewhere classified: Secondary | ICD-10-CM

## 2022-11-24 DIAGNOSIS — R262 Difficulty in walking, not elsewhere classified: Secondary | ICD-10-CM | POA: Diagnosis not present

## 2022-11-24 DIAGNOSIS — M25551 Pain in right hip: Secondary | ICD-10-CM

## 2022-11-24 DIAGNOSIS — M6281 Muscle weakness (generalized): Secondary | ICD-10-CM | POA: Diagnosis not present

## 2022-11-24 NOTE — Therapy (Signed)
OUTPATIENT PHYSICAL THERAPY TREATMENT NOTE   Patient Name: [name] MRN: [id number] DOB:11/21/1940, 82 y.o., female Today's Date: 11/24/2022   END OF SESSION:   PT End of Session - 11/24/22 1352     Visit Number 8    Number of Visits 12    Date for PT Re-Evaluation 12/21/22    Progress Note Due on Visit 10    PT Start Time [id number]    PT Stop Time 1426    PT Time Calculation (min) 39 min    Activity Tolerance Patient tolerated treatment well;No increased pain    Behavior During Therapy WFL for tasks assessed/performed              Past Medical History:  Diagnosis Date   Back pain    Chronic constipation    past hx- no current constipation 02-2021   Hyperlipidemia    Hypertension    Multiple gastric ulcers    Osteopenia    Single kidney    donated kidney    Past Surgical History:  Procedure Laterality Date   CLEFT PALATE REPAIR     COLONOSCOPY     FOOT NEUROMA SURGERY  2/06   NEPHRECTOMY LIVING DONOR     Left   TUBAL LIGATION     UPPER GASTROINTESTINAL ENDOSCOPY     Patient Active Problem List   Diagnosis Date Noted   Right groin pain 10/10/2022   Current use of proton pump inhibitor 04/18/2022   Decreased GFR 04/18/2022   Vitamin B12 deficiency 04/18/2022   Multiple gastric ulcers 04/27/2021   Effusion of right knee joint 05/31/2020   Pain in right knee 05/31/2020   Pain in joint of left shoulder 05/20/2020   Pain in right foot 04/08/2020   Closed fracture of shaft of right fibula 01/08/2020   Pain in joint of right ankle 01/08/2020   Hematoma of left breast 01/02/2020   Carpal tunnel syndrome 05/27/2018   Pain in finger of right hand 04/24/2018   Pain in joint of right foot 04/24/2018   Hyperkalemia 02/01/2018   Encounter for screening mammogram for breast cancer 01/19/2017   Estrogen deficiency 01/19/2017   Routine general medical examination at a health care facility 06/11/2015   Encounter for Medicare annual wellness exam 11/14/2013    Family history of colon cancer 11/14/2013   Colon cancer screening 11/14/2013   Hypertension 01/27/2011   Osteoporosis 01/27/2011   Post-menopausal 01/27/2011   Low back pain radiating to left leg 03/13/2008   Hyperlipidemia 12/20/2007   CONSTIPATION, CHRONIC 12/20/2007   Prediabetes 12/20/2007     THERAPY DIAG:  Difficulty in walking, not elsewhere classified  Muscle weakness (generalized)  Stiffness of right hip, not elsewhere classified  Pain in right hip  PCP: Abner Greenspan, MD   REFERRING PROVIDER: Gregor Hams, MD   REFERRING DIAG:  Diagnosis  M25.551 (ICD-10-CM) - Right hip pain  M70.61 (ICD-10-CM) - Trochanteric bursitis of right hip      THERAPY DIAG:  Difficulty in walking, not elsewhere classified   Muscle weakness (generalized)   Stiffness of right hip, not elsewhere classified   Pain in right hip   Rationale for Evaluation and Treatment: Rehabilitation   ONSET DATE: Early December 2023   SUBJECTIVE:    SUBJECTIVE STATEMENT: [name] notes no pain pills since her last visit.  She is sore but does not hurt with more physically demanding activities.  Standing and weightbearing endurance have improved and are still limited with overuse.  PERTINENT HISTORY: HLD, HTN, osteopenia, 1 kidney, previous foot neuroma surgery, previous Rt fibular fracture, pre-diabetes PAIN:  Are you having pain? Yes: NPRS scale: 0-3/10 this week on a 10/10 Pain location: Right lateral and posterior-lateral hip Pain description: Ache, grabbing Aggravating factors: Prolonged postures, sit to stand particularly after prolonged sitting Relieving factors: Topical willow balm, ice   PRECAUTIONS: None   WEIGHT BEARING RESTRICTIONS: No   FALLS:  Has patient fallen in last 6 months? No   LIVING ENVIRONMENT: Lives with: lives with their spouse Lives in: House/apartment Stairs:  Needs handrail Has following equipment at home: Single point cane but doesn't need it    OCCUPATION: Retired   PLOF: Independent   PATIENT GOALS: Return to playing with great grandchildren and return to walking   NEXT MD VISIT: ?   OBJECTIVE:    DIAGNOSTIC FINDINGS: FINDINGS: Pelvic ring is intact. Degenerative changes of lumbar spine are seen. No acute fracture or dislocation is noted. No soft tissue abnormality is seen.   IMPRESSION: No acute abnormality noted.   PATIENT SURVEYS:  11/17/2022 FOTO 67 (Goal met)  10/26/2022 FOTO 52 (Goal 66 in 12 visits)   COGNITION: Overall cognitive status: Within functional limits for tasks assessed                         SENSATION: No tingling or paresthesias noted     MUSCLE LENGTH: 11/15/2022 Hamstrings: Right 55 deg; Left 60 deg 10/26/2022 Hamstrings: Right 45 deg; Left 50 deg     LOWER EXTREMITY ROM:   Passive ROM Left/Right 10/26/2022 Left/Right 11/15/2022  Hip flexion 100/85 110/105  Hip extension      Hip abduction      Hip adduction      Hip internal rotation 2/0  9/6  Hip external rotation 26/19  31/40  Knee flexion      Knee extension      Ankle dorsiflexion      Ankle plantarflexion      Ankle inversion      Ankle eversion       (Blank rows = not tested)   LOWER EXTREMITY MMT:   Strength 10/26/2022  11/15/2022 Left/Right  Hip flexion Strength deferred at eval due to pain    Hip extension      Hip abduction    4+/4- out of 5  Hip adduction      Hip internal rotation      Hip external rotation      Knee flexion      Knee extension      Ankle dorsiflexion      Ankle plantarflexion      Ankle inversion      Ankle eversion       (Blank rows = not tested)   GAIT: Distance walked: In the clinic Assistive device utilized: None Level of assistance: Complete Independence Comments: Decreased stance time Right with Right lateral lean     TODAY'S TREATMENT:                                             DATE:  11/24/2022 Nu Step Level 6 for 4 minutes Single knee to chest stretch (other leg straight) 4X  20 seconds Gluteal stretch 4X 20 seconds Figure 4 stretch supine 4X 20 seconds Clams (lie left, lift right) 15X with red theraband resistance Prone  alternating hip extensions 10X for 3 seconds Alternating hip hike 10X 3 seconds  Neuromuscular re-education: Tandem balance 4X 20 seconds Bil (no break taken)  Functional Activities: Double leg press 87# 10 with slow eccentrics Single leg press 50# 2 sets of 10 each Step-down off 4 inch step 2 sets of 10 slow eccentrics Step-up and over 6 inch step 10X slow eccentrics   11/22/2022 Single knee to chest stretch (other leg straight) 4X 20 seconds Gluteal stretch 4X 20 seconds Figure 4 stretch supine 4X 20 seconds Clams (lie left, lift right) 10X with red theraband resistance Prone alternating hip extensions 2 sets of 10 for 3 seconds Alternating hip hike 10X 3 seconds  Neuromuscular re-education: Tandem balance 4X 20 seconds Bil (no break taken)  Functional Activities: Double leg press 75# 10 with slow eccentrics Single leg press 43# and 50# 10X each Step-down off 4 inch step 10X slow eccentrics   11/17/2022 Single knee to chest stretch (other leg straight) 4X 20 seconds Gluteal stretch 4X 20 seconds Figure 4 stretch supine 4X 20 seconds Clams (lie left, lift right) 10X with red theraband resistance Prone alternating hip extensions 2 sets of 10 for 3 seconds Alternating hip hike 10X 3 seconds  Neuromuscular re-education: Tandem balance 3X 20 seconds Bil (no break taken)  Functional Activities: Double leg press 75# 10 with slow eccentrics 75# Single leg press 37# and 50# 10X each Step-down off 4 inch step 10X slow eccentrics   PATIENT EDUCATION:  Education details: Reviewed HEP and exam findings Person educated: Patient Education method: Explanation, Demonstration, Tactile cues, Verbal cues, and Handouts Education comprehension: verbalized understanding, returned demonstration, verbal cues required, tactile cues required,  and needs further education   HOME EXERCISE PROGRAM: Access Code: [email or web address] URL: [email or web address] Date: 11/24/2022 Prepared by: Vista Mink  Exercises - Supine Gluteus Stretch  - 1-2 x daily - 7 x weekly - 1 sets - 5 reps - 20 seconds hold - Supine Figure 4 Piriformis Stretch  - 1-2 x daily - 7 x weekly - 1 sets - 5 reps - 20 seconds hold - Standing Hip Hiking  - 3-5 x daily - 3-4 x weekly - 1 sets - 10 reps - 3 seconds hold - Clam with Resistance  - 1 x daily - 3-4 x weekly - 2-3 sets - 10 reps - 3 seconds hold - Single Knee to Chest Stretch  - 1 x daily - 7 x weekly - 1 sets - 5 reps - 20 seconds hold - Sidelying Hip Abduction  - 1 x daily - 3-4 x weekly - 2 sets - 10 reps - 5 seconds hold - Prone Hip Extension  - 1 x daily - 3-4 x weekly - 2 sets - 10 reps - 3 seconds hold   ASSESSMENT:   CLINICAL IMPRESSION: Stoney continues to progress with her pain and weight-bearing function.  Strength, particularly hip abductors will benefit from continued work with transfer into independent PT when goals are met and Anda is independent with her HEP.  Akeiba will try independent rehabilitation for a week and we will follow-up at that time to reassess and make additional recommendations.  OBJECTIVE IMPAIRMENTS: Abnormal gait, decreased activity tolerance, decreased endurance, decreased knowledge of condition, difficulty walking, decreased ROM, decreased strength, increased edema, impaired perceived functional ability, increased muscle spasms, impaired flexibility, and pain.    ACTIVITY LIMITATIONS: carrying, lifting, bending, sitting, standing, squatting, sleeping, stairs, transfers, bed mobility, and locomotion level   PARTICIPATION LIMITATIONS: community activity  PERSONAL FACTORS: HLD, HTN, osteopenia, 1 kidney, previous foot neuroma surgery, previous Rt fibular fracture, pre-diabetes are also affecting patient's functional outcome.    REHAB POTENTIAL: Good   CLINICAL  DECISION MAKING: Stable/uncomplicated   EVALUATION COMPLEXITY: Low     GOALS: Goals reviewed with patient? Yes   SHORT TERM GOALS: Target date: 11/23/2022 Christiyana will be independent with her day 1 HEP Baseline: Started 10/26/2022 Goal status: Met 11/15/2022   2.  Improve right hip flexion to 100 degrees, IR to 5 degrees and ER to 30 degrees Baseline: 85, 0 and 19 respectively Goal status: Met 11/15/2022     LONG TERM GOALS: Target date: 12/21/2022   Improve FOTO to 66 in 12 visits Baseline: 52 Goal status: Met 11/17/2022   2.  Kayleigha will report right hip, gluteal and groin pain consistently 0-3/10 on the Visual Analog Scale (VAS). Baseline: 2-6/10 Goal status: Met 11/24/2022   3.  Improve right hip ER to 35 degrees Baseline: 19 degrees Goal status: Met 11/15/2022   4.  Improve right hip abductors strength to 4+/5 MMT or better Baseline: Deferred due to pain for MMT assessment but decreased stance time and lateral lean noted with gait Goal status: On Going 11/24/2022   5.  Jeralyn will be independent with her long-term HEP at [place] Baseline: Started 10/26/2022 Goal status: On Going 11/24/2022   PLAN:   PT FREQUENCY: 1-2x/week   PT DURATION: 8 weeks   PLANNED INTERVENTIONS: Therapeutic exercises, Therapeutic activity, Neuromuscular re-education, Balance training, Gait training, Patient/Family education, Self Care, Stair training, Dry Needling, Cryotherapy, Moist heat, and Manual therapy   PLAN FOR NEXT SESSION: Appropriately progress hip strength while avoiding overuse.  Nu Step?  Step-down, up and over.  DC?   Farley Ly, PT, MPT 11/24/2022, 3:47 PM

## 2022-12-07 ENCOUNTER — Ambulatory Visit: Payer: Medicare HMO | Admitting: Rehabilitative and Restorative Service Providers"

## 2022-12-07 ENCOUNTER — Encounter: Payer: Self-pay | Admitting: Rehabilitative and Restorative Service Providers"

## 2022-12-07 DIAGNOSIS — M25551 Pain in right hip: Secondary | ICD-10-CM | POA: Diagnosis not present

## 2022-12-07 DIAGNOSIS — R262 Difficulty in walking, not elsewhere classified: Secondary | ICD-10-CM | POA: Diagnosis not present

## 2022-12-07 DIAGNOSIS — M25651 Stiffness of right hip, not elsewhere classified: Secondary | ICD-10-CM

## 2022-12-07 DIAGNOSIS — M6281 Muscle weakness (generalized): Secondary | ICD-10-CM

## 2022-12-07 NOTE — Therapy (Signed)
OUTPATIENT PHYSICAL THERAPY TREATMENT/DISCHARGE NOTE   Patient Name: [name] MRN: [id number] DOB:[birth year], 82 y.o., female Today's Date: 12/07/2022  PHYSICAL THERAPY DISCHARGE SUMMARY  Visits from Start of Care: 9  Current functional level related to goals / functional outcomes: See note   Remaining deficits: See note   Education / Equipment: HEP   Patient agrees to discharge. Patient goals were met. Patient is being discharged due to meeting the stated rehab goals.   END OF SESSION:   PT End of Session - 12/07/22 1300     Visit Number 9    Number of Visits 12    Date for PT Re-Evaluation 12/21/22    Authorization - Visit Number 9    Progress Note Due on Visit 10    PT Start Time [id number]    PT Stop Time 1340    PT Time Calculation (min) 41 min    Activity Tolerance Patient tolerated treatment well;No increased pain    Behavior During Therapy WFL for tasks assessed/performed             Past Medical History:  Diagnosis Date   Back pain    Chronic constipation    past hx- no current constipation 02-2021   Hyperlipidemia    Hypertension    Multiple gastric ulcers    Osteopenia    Single kidney    donated kidney    Past Surgical History:  Procedure Laterality Date   CLEFT PALATE REPAIR     COLONOSCOPY     FOOT NEUROMA SURGERY  2/06   NEPHRECTOMY LIVING DONOR     Left   TUBAL LIGATION     UPPER GASTROINTESTINAL ENDOSCOPY     Patient Active Problem List   Diagnosis Date Noted   Right groin pain 10/10/2022   Current use of proton pump inhibitor 04/18/2022   Decreased GFR 04/18/2022   Vitamin B12 deficiency 04/18/2022   Multiple gastric ulcers 04/27/2021   Effusion of right knee joint 05/31/2020   Pain in right knee 05/31/2020   Pain in joint of left shoulder 05/20/2020   Pain in right foot 04/08/2020   Closed fracture of shaft of right fibula 01/08/2020   Pain in joint of right ankle 01/08/2020   Hematoma of left breast 01/02/2020   Carpal  tunnel syndrome 05/27/2018   Pain in finger of right hand 04/24/2018   Pain in joint of right foot 04/24/2018   Hyperkalemia 02/01/2018   Encounter for screening mammogram for breast cancer 01/19/2017   Estrogen deficiency 01/19/2017   Routine general medical examination at a health care facility 06/11/2015   Encounter for Medicare annual wellness exam 11/14/2013   Family history of colon cancer 11/14/2013   Colon cancer screening 11/14/2013   Hypertension 01/27/2011   Osteoporosis 01/27/2011   Post-menopausal 01/27/2011   Low back pain radiating to left leg 03/13/2008   Hyperlipidemia 12/20/2007   CONSTIPATION, CHRONIC 12/20/2007   Prediabetes 12/20/2007     THERAPY DIAG:  Difficulty in walking, not elsewhere classified  Muscle weakness (generalized)  Stiffness of right hip, not elsewhere classified  Pain in right hip  PCP: Abner Greenspan, MD   REFERRING PROVIDER: Gregor Hams, MD   REFERRING DIAG:  Diagnosis  M25.551 (ICD-10-CM) - Right hip pain  M70.61 (ICD-10-CM) - Trochanteric bursitis of right hip      THERAPY DIAG:  Difficulty in walking, not elsewhere classified   Muscle weakness (generalized)   Stiffness of right hip, not elsewhere classified  Pain in right hip   Rationale for Evaluation and Treatment: Rehabilitation   ONSET DATE: Early December 2023   SUBJECTIVE:    SUBJECTIVE STATEMENT: Tristi notes no pain pills in a while.  She does not need to stop what she is doing due to pain.  Standing and weight-bearing endurance have improved and she tries to avoid overuse.   PERTINENT HISTORY: HLD, HTN, osteopenia, 1 kidney, previous foot neuroma surgery, previous Rt fibular fracture, pre-diabetes PAIN:  Are you having pain? Yes: NPRS scale: 0-2/10 this week on a 10/10 Pain location: Right lateral and posterior-lateral hip Pain description: Ache, grabbing Aggravating factors: Prolonged postures, sit to stand particularly after prolonged  sitting Relieving factors: Topical willow balm, ice   PRECAUTIONS: None   WEIGHT BEARING RESTRICTIONS: No   FALLS:  Has patient fallen in last 6 months? No   LIVING ENVIRONMENT: Lives with: lives with their spouse Lives in: House/apartment Stairs:  Needs handrail Has following equipment at home: Single point cane but doesn't need it   OCCUPATION: Retired   PLOF: Independent   PATIENT GOALS: Return to playing with great grandchildren and return to walking   NEXT MD VISIT: ?   OBJECTIVE:    DIAGNOSTIC FINDINGS: FINDINGS: Pelvic ring is intact. Degenerative changes of lumbar spine are seen. No acute fracture or dislocation is noted. No soft tissue abnormality is seen.   IMPRESSION: No acute abnormality noted.   PATIENT SURVEYS:  11/17/2022 FOTO 67 (Goal met)  10/26/2022 FOTO 52 (Goal 66 in 12 visits)   COGNITION: Overall cognitive status: Within functional limits for tasks assessed                         SENSATION: No tingling or paresthesias noted     MUSCLE LENGTH: 11/15/2022 Hamstrings: Right 55 deg; Left 60 deg 10/26/2022 Hamstrings: Right 45 deg; Left 50 deg     LOWER EXTREMITY ROM:   Passive ROM Left/Right 10/26/2022 Left/Right 11/15/2022  Hip flexion 100/85 110/105  Hip extension      Hip abduction      Hip adduction      Hip internal rotation 2/0  9/6  Hip external rotation 26/19  31/40  Knee flexion      Knee extension      Ankle dorsiflexion      Ankle plantarflexion      Ankle inversion      Ankle eversion       (Blank rows = not tested)   LOWER EXTREMITY MMT:   Strength 10/26/2022  11/15/2022 Left/Right  Hip flexion Strength deferred at eval due to pain    Hip extension      Hip abduction    4+/4- out of 5  Hip adduction      Hip internal rotation      Hip external rotation      Knee flexion      Knee extension      Ankle dorsiflexion      Ankle plantarflexion      Ankle inversion      Ankle eversion       (Blank rows = not tested)    GAIT: Distance walked: In the clinic Assistive device utilized: None Level of assistance: Complete Independence Comments: Decreased stance time Right with Right lateral lean     TODAY'S TREATMENT:  DATE:  12/07/2022 Single knee to chest stretch (other leg straight) 4X 20 seconds Gluteal stretch 4X 20 seconds Figure 4 stretch supine 4X 20 seconds Clams (lie left, lift right) 15X with red theraband resistance Prone alternating hip extensions 2 sets of 10X for 3 seconds Alternating hip hike 2 sets of 10X 3 seconds Side lying hip abduction 2 sets of 10 for 3 seconds (1/4 turn to stomach)  Knee extension machine 90-40 degrees 15# 10X slow eccentrics  Neuromuscular re-education: Tandem balance reviewed  Functional Activities: Double leg press 100# 10 with slow eccentrics Single leg press 50# 10 each   11/24/2022 Nu Step Level 6 for 4 minutes Single knee to chest stretch (other leg straight) 4X 20 seconds Gluteal stretch 4X 20 seconds Figure 4 stretch supine 4X 20 seconds Clams (lie left, lift right) 15X with red theraband resistance Prone alternating hip extensions 10X for 3 seconds Alternating hip hike 10X 3 seconds  Neuromuscular re-education: Tandem balance 4X 20 seconds Bil (no break taken)  Functional Activities: Double leg press 87# 10 with slow eccentrics Single leg press 50# 2 sets of 10 each Step-down off 4 inch step 2 sets of 10 slow eccentrics Step-up and over 6 inch step 10X slow eccentrics   11/22/2022 Single knee to chest stretch (other leg straight) 4X 20 seconds Gluteal stretch 4X 20 seconds Figure 4 stretch supine 4X 20 seconds Clams (lie left, lift right) 10X with red theraband resistance Prone alternating hip extensions 2 sets of 10 for 3 seconds Alternating hip hike 10X 3 seconds  Neuromuscular re-education: Tandem balance 4X 20 seconds Bil (no break taken)  Functional Activities: Double leg press 75# 10  with slow eccentrics Single leg press 43# and 50# 10X each Step-down off 4 inch step 10X slow eccentrics   PATIENT EDUCATION:  Education details: Reviewed HEP and exam findings Person educated: Patient Education method: Explanation, Demonstration, Tactile cues, Verbal cues, and Handouts Education comprehension: verbalized understanding, returned demonstration, verbal cues required, tactile cues required, and needs further education   HOME EXERCISE PROGRAM: Access Code: [email or web address] URL: [email or web address] Date: 12/07/2022 Prepared by: Vista Mink  Exercises - Supine Gluteus Stretch  - 1 x daily - 7 x weekly - 1 sets - 5 reps - 20 seconds hold - Supine Figure 4 Piriformis Stretch  - 1 x daily - 7 x weekly - 1 sets - 5 reps - 20 seconds hold - Standing Hip Hiking  - 3-5 x daily - 3 x weekly - 1 sets - 10 reps - 3 seconds hold - Clam with Resistance  - 1 x daily - 3 x weekly - 2-3 sets - 10 reps - 3 seconds hold - Single Knee to Chest Stretch  - 1 x daily - 7 x weekly - 1 sets - 5 reps - 20 seconds hold - Sidelying Hip Abduction  - 1 x daily - 3 x weekly - 2 sets - 10 reps - 5 seconds hold - Prone Hip Extension  - 1 x daily - 3 x weekly - 2 sets - 10 reps - 3 seconds hold   ASSESSMENT:   CLINICAL IMPRESSION: Donyelle has done a great job with her physical therapy.  She has demonstrated independence with her long-term home exercise program and has met long-term goals.  Zakaria is ready for independent rehabilitation and will be discharged from supervised physical therapy.  OBJECTIVE IMPAIRMENTS: Abnormal gait, decreased activity tolerance, decreased endurance, decreased knowledge of condition, difficulty walking, decreased ROM, decreased strength, increased edema,  impaired perceived functional ability, increased muscle spasms, impaired flexibility, and pain.    ACTIVITY LIMITATIONS: carrying, lifting, bending, sitting, standing, squatting, sleeping, stairs, transfers, bed  mobility, and locomotion level   PARTICIPATION LIMITATIONS: community activity   PERSONAL FACTORS: HLD, HTN, osteopenia, 1 kidney, previous foot neuroma surgery, previous Rt fibular fracture, pre-diabetes are also affecting patient's functional outcome.    REHAB POTENTIAL: Good   CLINICAL DECISION MAKING: Stable/uncomplicated   EVALUATION COMPLEXITY: Low     GOALS: Goals reviewed with patient? Yes   SHORT TERM GOALS: Target date: 11/23/2022 Inaaya will be independent with her day 1 HEP Baseline: Started 10/26/2022 Goal status: Met 11/15/2022   2.  Improve right hip flexion to 100 degrees, IR to 5 degrees and ER to 30 degrees Baseline: 85, 0 and 19 respectively Goal status: Met 11/15/2022     LONG TERM GOALS: Target date: 12/21/2022   Improve FOTO to 66 in 12 visits Baseline: 52 Goal status: Met 11/17/2022   2.  Madgie will report right hip, gluteal and groin pain consistently 0-3/10 on the Visual Analog Scale (VAS). Baseline: 2-6/10 Goal status: Met 11/24/2022   3.  Improve right hip ER to 35 degrees Baseline: 19 degrees Goal status: Met 11/15/2022   4.  Improve right hip abductors strength to 4+/5 MMT or better Baseline: Deferred due to pain for MMT assessment but decreased stance time and lateral lean noted with gait Goal status: On Going 12/07/2022   5.  Lizbeht will be independent with her long-term HEP at [place] Baseline: Started 10/26/2022 Goal status: Met 12/07/2022   PLAN:   PT FREQUENCY: DC   PT DURATION: DC   PLANNED INTERVENTIONS: Therapeutic exercises, Therapeutic activity, Neuromuscular re-education, Balance training, Gait training, Patient/Family education, Self Care, Stair training, Dry Needling, Cryotherapy, Moist heat, and Manual therapy   PLAN FOR NEXT SESSION: DC   Farley Ly, PT, MPT 12/07/2022, 4:22 PM

## 2022-12-08 ENCOUNTER — Encounter: Payer: Medicare HMO | Admitting: Rehabilitative and Restorative Service Providers"

## 2022-12-14 ENCOUNTER — Encounter: Payer: Medicare HMO | Admitting: Rehabilitative and Restorative Service Providers"

## 2022-12-15 ENCOUNTER — Encounter: Payer: Medicare HMO | Admitting: Rehabilitative and Restorative Service Providers"

## 2023-01-04 ENCOUNTER — Other Ambulatory Visit: Payer: Self-pay | Admitting: Family Medicine

## 2023-01-17 ENCOUNTER — Other Ambulatory Visit: Payer: Self-pay | Admitting: Internal Medicine

## 2023-01-19 ENCOUNTER — Ambulatory Visit (INDEPENDENT_AMBULATORY_CARE_PROVIDER_SITE_OTHER): Payer: Medicare HMO | Admitting: Family Medicine

## 2023-01-19 ENCOUNTER — Encounter: Payer: Self-pay | Admitting: Family Medicine

## 2023-01-19 VITALS — BP 151/80 | HR 82 | Temp 97.6°F | Ht 61.5 in | Wt 138.1 lb

## 2023-01-19 DIAGNOSIS — E538 Deficiency of other specified B group vitamins: Secondary | ICD-10-CM | POA: Diagnosis not present

## 2023-01-19 DIAGNOSIS — R202 Paresthesia of skin: Secondary | ICD-10-CM | POA: Diagnosis not present

## 2023-01-19 DIAGNOSIS — I10 Essential (primary) hypertension: Secondary | ICD-10-CM | POA: Diagnosis not present

## 2023-01-19 LAB — BASIC METABOLIC PANEL
BUN: 14 mg/dL (ref 6–23)
CO2: 28 mEq/L (ref 19–32)
Calcium: 9.2 mg/dL (ref 8.4–10.5)
Chloride: 101 mEq/L (ref 96–112)
Creatinine, Ser: 1.1 mg/dL (ref 0.40–1.20)
GFR: 47.48 mL/min — ABNORMAL LOW (ref >60.00)
Glucose, Bld: 93 mg/dL (ref 70–99)
Potassium: 4.9 mEq/L (ref 3.5–5.1)
Sodium: 137 mEq/L (ref 135–145)

## 2023-01-19 LAB — CBC WITH DIFFERENTIAL/PLATELET
Basophils Absolute: 0.1 10*3/uL (ref 0.0–0.1)
Basophils Relative: 0.9 % (ref 0.0–3.0)
Eosinophils Absolute: 0.3 10*3/uL (ref 0.0–0.7)
Eosinophils Relative: 3.7 % (ref 0.0–5.0)
HCT: 39.2 % (ref 36.0–46.0)
Hemoglobin: 13.1 g/dL (ref 12.0–15.0)
Lymphocytes Relative: 22.1 % (ref 12.0–46.0)
Lymphs Abs: 1.5 10*3/uL (ref 0.7–4.0)
MCHC: 33.4 g/dL (ref 30.0–36.0)
MCV: 94.1 fl (ref 78.0–100.0)
Monocytes Absolute: 0.4 10*3/uL (ref 0.1–1.0)
Monocytes Relative: 5.8 % (ref 3.0–12.0)
Neutro Abs: 4.7 10*3/uL (ref 1.4–7.7)
Neutrophils Relative %: 67.5 % (ref 43.0–77.0)
Platelets: 250 10*3/uL (ref 150.0–400.0)
RBC: 4.17 Mil/uL (ref 3.87–5.11)
RDW: 12.8 % (ref 11.5–15.5)
WBC: 6.9 10*3/uL (ref 4.0–10.5)

## 2023-01-19 LAB — VITAMIN B12: Vitamin B-12: 826 pg/mL (ref 211–911)

## 2023-01-19 LAB — SEDIMENTATION RATE: Sed Rate: 4 mm/hr (ref 0–30)

## 2023-01-19 MED ORDER — LISINOPRIL 10 MG PO TABS: 10.0000 mg | ORAL_TABLET | Freq: Every day | ORAL | 3 refills | Status: DC

## 2023-01-19 NOTE — Progress Notes (Signed)
Subjective:    Patient ID: [name], female    DOB: 1941/02/27, 82 y.o.   MRN: [id number]  HPI Pt presents for tingling in neck and face  Wt Readings from Last 3 Encounters:  01/19/23 138 lb 2 oz (62.7 kg)  10/18/22 132 lb (59.9 kg)  10/10/22 133 lb 2 oz (60.4 kg)   25.68 kg/m  Vitals:   01/19/23 0818  BP: (!) 166/82  Pulse: 82  Temp: 97.6 F (36.4 C)  SpO2: 98%    Using fluorouracil 5% cream for pre cancers on face  Feb 26-march 11  Fairly healed One irritated area under R mouth corner   Tingling on R side of face and neck  She rubs it and it goes away  Then will come back later Notices it 5-6 times per day   The cream caused a rash /irritation as expected   Has finised her 2 week course    Then itching on shoulder   No facial droop  No trouble speaking  No weakness  No head trauma  No headaches   HTN bp is stable today  No cp or palpitations or headaches or edema  No side effects to medicines  BP Readings from Last 3 Encounters:  01/19/23 (!) 151/80  10/18/22 (!) 152/86  10/10/22 134/68     Lisinopril 5 mg daily     Lab Results  Component Value Date   VITAMINB12 1,164 (H) 08/16/2022   Lab Results  Component Value Date   WBC 7.5 04/18/2022   HGB 12.6 04/18/2022   HCT 37.9 04/18/2022   MCV 92.3 04/18/2022   PLT 232.0 04/18/2022   Lab Results  Component Value Date   HGBA1C 6.1 04/18/2022   Lab Results  Component Value Date   CREATININE 1.17 08/16/2022   BUN 14 08/16/2022   NA 138 08/16/2022   K 4.7 08/16/2022   CL 103 08/16/2022   CO2 29 08/16/2022     Patient Active Problem List   Diagnosis Date Noted   Facial paresthesia 01/19/2023   Right groin pain 10/10/2022   Current use of proton pump inhibitor 04/18/2022   Decreased GFR 04/18/2022   Vitamin B12 deficiency 04/18/2022   Multiple gastric ulcers 04/27/2021   Effusion of right knee joint 05/31/2020   Pain in right knee 05/31/2020   Pain in joint of left  shoulder 05/20/2020   Pain in right foot 04/08/2020   Closed fracture of shaft of right fibula 01/08/2020   Pain in joint of right ankle 01/08/2020   Hematoma of left breast 01/02/2020   Carpal tunnel syndrome 05/27/2018   Pain in finger of right hand 04/24/2018   Pain in joint of right foot 04/24/2018   Hyperkalemia 02/01/2018   Encounter for screening mammogram for breast cancer 01/19/2017   Estrogen deficiency 01/19/2017   Routine general medical examination at a health care facility 06/11/2015   Encounter for Medicare annual wellness exam 11/14/2013   Family history of colon cancer 11/14/2013   Colon cancer screening 11/14/2013   Hypertension 01/27/2011   Osteoporosis 01/27/2011   Post-menopausal 01/27/2011   Low back pain radiating to left leg 03/13/2008   Hyperlipidemia 12/20/2007   CONSTIPATION, CHRONIC 12/20/2007   Prediabetes 12/20/2007   Past Medical History:  Diagnosis Date   Back pain    Chronic constipation    past hx- no current constipation 02-2021   Hyperlipidemia    Hypertension    Multiple gastric ulcers    Osteopenia  Single kidney    donated kidney    Past Surgical History:  Procedure Laterality Date   CLEFT PALATE REPAIR     COLONOSCOPY     FOOT NEUROMA SURGERY  2/06   NEPHRECTOMY LIVING DONOR     Left   TUBAL LIGATION     UPPER GASTROINTESTINAL ENDOSCOPY     Social History   Tobacco Use   Smoking status: Never   Smokeless tobacco: Never  Vaping Use   Vaping Use: Never used  Substance Use Topics   Alcohol use: No   Drug use: No   Family History  Problem Relation Age of Onset   Colon cancer Mother    Diabetes Mother    Coronary artery disease Mother    Osteoporosis Mother    Dementia Mother    Coronary artery disease Father    Heart failure Father    Hypertension Father    Stomach cancer Neg Hx    Pancreatic cancer Neg Hx    Esophageal cancer Neg Hx    Liver disease Neg Hx    Colon polyps Neg Hx    Rectal cancer Neg Hx     Breast cancer Neg Hx    Allergies  Allergen Reactions   Codeine    Hydrocodone Bit-Homatrop Mbr     Caused flu-like symptoms   Current Outpatient Medications on File Prior to Visit  Medication Sig Dispense Refill   aspirin 81 MG tablet Take 81 mg by mouth daily.     atorvastatin (LIPITOR) 10 MG tablet TAKE 1 TABLET BY MOUTH EVERY DAY 90 tablet 3   Cyanocobalamin (B-12) 1000 MCG CAPS Take 1 capsule by mouth daily.     D 1000 25 MCG (1000 UT) capsule SMARTSIG:1 By Mouth     Misc Natural Products (RA GLUCOSAMINE-CHONDROIT-MSM-D) TABS      pantoprazole (PROTONIX) 40 MG tablet Take 1 tablet (40 mg total) by mouth 2 (two) times daily. Office visit for further refills 180 tablet 0   No current facility-administered medications on file prior to visit.    Review of Systems  Constitutional:  Negative for activity change, appetite change, fatigue, fever and unexpected weight change.  HENT:  Negative for congestion, ear pain, rhinorrhea, sinus pressure and sore throat.   Eyes:  Negative for pain, redness and visual disturbance.  Respiratory:  Negative for cough, shortness of breath and wheezing.   Cardiovascular:  Negative for chest pain and palpitations.  Gastrointestinal:  Negative for abdominal pain, blood in stool, constipation and diarrhea.  Endocrine: Negative for polydipsia and polyuria.  Genitourinary:  Negative for dysuria, frequency and urgency.  Musculoskeletal:  Negative for arthralgias, back pain and myalgias.  Skin:  Negative for pallor and rash.  Allergic/Immunologic: Negative for environmental allergies.  Neurological:  Positive for numbness. Negative for dizziness, tremors, seizures, syncope, facial asymmetry, speech difficulty, weakness, light-headedness and headaches.  Hematological:  Negative for adenopathy. Does not bruise/bleed easily.  Psychiatric/Behavioral:  Negative for decreased concentration and dysphoric mood. The patient is not nervous/anxious.        Objective:    Physical Exam Constitutional:      General: She is not in acute distress.    Appearance: Normal appearance. She is well-developed and normal weight. She is not ill-appearing or diaphoretic.  HENT:     Head: Normocephalic and atraumatic.     Comments: No facial or temporal tenderness No droop    Right Ear: External ear normal.     Left Ear: External ear normal.  Nose: Nose normal.     Mouth/Throat:     Pharynx: No oropharyngeal exudate.  Eyes:     General: No scleral icterus.       Right eye: No discharge.        Left eye: No discharge.     Conjunctiva/sclera: Conjunctivae normal.     Pupils: Pupils are equal, round, and reactive to light.     Comments: No nystagmus  Neck:     Thyroid: No thyromegaly.     Vascular: No carotid bruit or JVD.     Trachea: No tracheal deviation.  Cardiovascular:     Rate and Rhythm: Normal rate and regular rhythm.     Heart sounds: Normal heart sounds. No murmur heard. Pulmonary:     Effort: Pulmonary effort is normal. No respiratory distress.     Breath sounds: Normal breath sounds. No wheezing or rales.  Abdominal:     General: Bowel sounds are normal. There is no distension.     Palpations: Abdomen is soft. There is no mass.     Tenderness: There is no abdominal tenderness.  Musculoskeletal:        General: No tenderness.     Cervical back: Full passive range of motion without pain, normal range of motion and neck supple.     Right lower leg: No edema.     Left lower leg: No edema.  Lymphadenopathy:     Cervical: No cervical adenopathy.  Skin:    General: Skin is warm and dry.     Coloration: Skin is not pale.     Findings: No rash.  Neurological:     Mental Status: She is alert and oriented to person, place, and time.     Cranial Nerves: No cranial nerve deficit.     Sensory: Sensation is intact. No sensory deficit.     Motor: No weakness, tremor, atrophy, abnormal muscle tone or pronator drift.     Coordination: Romberg sign  negative. Coordination normal. Finger-Nose-Finger Test normal.     Gait: Gait normal.     Deep Tendon Reflexes: Reflexes are normal and symmetric. Reflexes normal.     Comments: No focal cerebellar signs   Not symptomatic currently   Psychiatric:        Behavior: Behavior normal.        Thought Content: Thought content normal.           Assessment & Plan:   Problem List Items Addressed This Visit       Cardiovascular and Mediastinum   Hypertension    Not at goal BP: (!) 151/80   Will inc her lisinopril from 5 to 10 mg daily  F/u 1 mo Check at home/ disc proper technique         Relevant Medications   lisinopril (ZESTRIL) 10 MG tablet   Other Relevant Orders   Basic metabolic panel (Completed)   CBC with Differential/Platelet (Completed)     Other   Facial paresthesia - Primary    R sided and intermittent  Resolves by rubbing the area No rash and no muscular symptoms or headache Nl exam  Labs ordered   ? If related to her recent efudex treatment   If labs are negative and symptoms do not resolve with better bp control then consider imaging study of brain   ER precautions reviewed in detail      Relevant Orders   Sedimentation Rate (Completed)   Vitamin B12 (Completed)   Vitamin B12 deficiency  New facial paresthesia   Level ordered  Last time was high       Relevant Orders   Vitamin B12 (Completed)

## 2023-01-19 NOTE — Patient Instructions (Addendum)
Go up on your lisinopril to 10 mg daily   If you have a cuff- check bp at home (when relaxed , sitting with your arm supported at heart level   Follow up here in about a month    Still follow up with dermatology   Lab today   We may consider a brain imaging study   In the meantime if you develop any new symptoms or these worsen - call  If any rash- call  If you have droop - go to the ER

## 2023-01-21 NOTE — Assessment & Plan Note (Signed)
New facial paresthesia   Level ordered  Last time was high

## 2023-01-21 NOTE — Assessment & Plan Note (Signed)
R sided and intermittent  Resolves by rubbing the area No rash and no muscular symptoms or headache Nl exam  Labs ordered   ? If related to her recent efudex treatment   If labs are negative and symptoms do not resolve with better bp control then consider imaging study of brain   ER precautions reviewed in detail

## 2023-01-21 NOTE — Assessment & Plan Note (Signed)
Not at goal BP: (!) 151/80   Will inc her lisinopril from 5 to 10 mg daily  F/u 1 mo Check at home/ disc proper technique

## 2023-01-22 NOTE — Addendum Note (Signed)
Addended by: Roxy Manns A on: 01/22/2023 08:16 PM   Modules accepted: Orders

## 2023-01-30 ENCOUNTER — Ambulatory Visit
Admission: RE | Admit: 2023-01-30 | Discharge: 2023-01-30 | Disposition: A | Discharge status: Home / Self Care | Payer: Medicare HMO | Source: Ambulatory Visit | Attending: Family Medicine | Admitting: Family Medicine

## 2023-01-30 ENCOUNTER — Telehealth: Payer: Self-pay | Admitting: Family Medicine

## 2023-01-30 DIAGNOSIS — R202 Paresthesia of skin: Secondary | ICD-10-CM | POA: Diagnosis not present

## 2023-01-30 DIAGNOSIS — I6782 Cerebral ischemia: Secondary | ICD-10-CM | POA: Diagnosis not present

## 2023-01-30 MED ORDER — GADOPICLENOL 0.5 MMOL/ML IV SOLN
6.0000 mL | Freq: Once | INTRAVENOUS | Status: AC | PRN
  Administered 2023-01-30: 6 mL via INTRAVENOUS

## 2023-01-30 NOTE — Telephone Encounter (Signed)
Contacted [name] to schedule their annual wellness visit. Appointment made for 03/13/2023.  [facility] Care Guide [facility] AW[facility] TEAM Direct Dial: [phone]

## 2023-02-04 ENCOUNTER — Telehealth: Payer: Self-pay | Admitting: Family Medicine

## 2023-02-04 DIAGNOSIS — R202 Paresthesia of skin: Secondary | ICD-10-CM

## 2023-02-04 MED ORDER — GABAPENTIN 100 MG PO CAPS: 100.0000 mg | ORAL_CAPSULE | Freq: Two times a day (BID) | ORAL | 1 refills | Status: DC

## 2023-02-04 NOTE — Telephone Encounter (Signed)
-----   Message from Shon Millet, [place] sent at 02/02/2023 12:26 PM EDT ----- Pt notified of Dr. Royden Purl comments. Pt agreed with gabapentin and also a neuro referral. Pt doesn't have a city preference she said most of her doc are in [place] but either city is fine. Pt does want to try the Gabapentin please send it to [facility] [place]/ [address]

## 2023-02-04 NOTE — Assessment & Plan Note (Signed)
May be due to a trigeminal neuralgia MRI/ MRA reviewed Will ref to neuro  Trial of gabapentin 100 mg bid- warned of poss sedation and fall risk (usually short term)

## 2023-02-04 NOTE — Telephone Encounter (Signed)
Try the gabapentin 100 mg twice daily  If nay be sedating  If you get dizzy from it let us know  Usually there is an adjustment process   We can go up on dose depending on how you do  Let mw know how you are in 2-4 wk  I put the referral in  Please let us know if you don't hear in 1-2 weeks

## 2023-02-05 ENCOUNTER — Encounter: Payer: Self-pay | Admitting: Neurology

## 2023-02-05 NOTE — Telephone Encounter (Signed)
Pt notified of Dr. Tower's comments  

## 2023-02-06 DIAGNOSIS — R208 Other disturbances of skin sensation: Secondary | ICD-10-CM | POA: Diagnosis not present

## 2023-02-06 DIAGNOSIS — L570 Actinic keratosis: Secondary | ICD-10-CM | POA: Diagnosis not present

## 2023-02-06 DIAGNOSIS — M792 Neuralgia and neuritis, unspecified: Secondary | ICD-10-CM | POA: Diagnosis not present

## 2023-02-08 ENCOUNTER — Other Ambulatory Visit: Payer: Medicare HMO

## 2023-02-19 ENCOUNTER — Encounter: Payer: Self-pay | Admitting: Family Medicine

## 2023-02-19 ENCOUNTER — Ambulatory Visit (INDEPENDENT_AMBULATORY_CARE_PROVIDER_SITE_OTHER): Payer: Medicare HMO | Admitting: Family Medicine

## 2023-02-19 VITALS — BP 131/65 | HR 74 | Temp 97.6°F | Ht 61.5 in | Wt 134.5 lb

## 2023-02-19 DIAGNOSIS — I10 Essential (primary) hypertension: Secondary | ICD-10-CM

## 2023-02-19 DIAGNOSIS — R202 Paresthesia of skin: Secondary | ICD-10-CM

## 2023-02-19 NOTE — Patient Instructions (Addendum)
Your blood pressure is improved  Hold the gabapentin for now  Unsure if this is causing your head pressure  Let us know if symptoms get worse (the tingling or pressure)  Continue lisinopril   Follow up with neurologist as planned  Keep [place] posted if nothing changes before then

## 2023-02-19 NOTE — Progress Notes (Signed)
Subjective:    Patient ID: [name], female    DOB: 06/04/1941, 82 y.o.   MRN: [id number]  HPI Pt presents for f/u of HTN  Also pressure sensation in her head  Wt Readings from Last 3 Encounters:  02/19/23 134 lb 8 oz (61 kg)  01/19/23 138 lb 2 oz (62.7 kg)  10/18/22 132 lb (59.9 kg)   25.00 kg/m  Last visit pt had increase in bp and tingling in her face   Lisinopril was increased from 5 to 10 mg   BP Readings from Last 3 Encounters:  02/19/23 131/65  01/19/23 (!) 151/80  10/18/22 (!) 152/86      Facial paresthesia  No cause noted on MRI/ MRA   We px gabapentin  Also referred ot neuro   Now low grade head pressure  Mild  Tolerable  Both sides / across forehead   Paresthesia is a little bit better Still occurs on and off    Has neuro appt June 5 th  Also stress Husband is getting alz dementia  Also diabetic Needing more care     Imaging MRI/MRA  MR ANGIO HEAD WO CONTRAST  Result Date: 01/30/2023 CLINICAL DATA:  Provided history: Facial paresthesia. Headache, cluster/trigeminal. Right trigeminal facial discomfort and paresthesia. EXAM: MRI HEAD WITHOUT AND WITH CONTRAST MRA HEAD WITHOUT CONTRAST TECHNIQUE: Multiplanar, multi-echo pulse sequences of the brain and surrounding structures were acquired without and with intravenous contrast. Angiographic images of the Circle of Willis were acquired using MRA technique without intravenous contrast. CONTRAST:  6 mL Vueway intravenous contrast. COMPARISON:  No pertinent prior exams available for comparison. FINDINGS: MRI HEAD FINDINGS A routine protocol brain MRI (without and with contrast) was ordered and performed. Brain: Mild generalized cerebral atrophy. Multifocal T2 FLAIR hyperintense signal abnormality within the cerebral white matter, nonspecific but compatible with mild chronic small vessel ischemic disease. There is no acute infarct. No evidence of an intracranial mass. No convincing chronic  intracranial blood products. No extra-axial fluid collection. No midline shift. No pathologic intracranial enhancement identified. Vascular: Maintained flow voids within the proximal large arterial vessels. Skull and upper cervical spine: No focal suspicious marrow lesion. Incompletely assessed cervical spondylosis. Sinuses/Orbits: No mass or acute finding within the imaged orbits. Small mucous retention cyst within the right maxillary sinus. Other: Small-volume fluid within the bilateral mastoid air cells. MRA HEAD FINDINGS Anterior circulation: The intracranial internal carotid arteries are patent. The M1 middle cerebral arteries are patent. No M2 proximal branch occlusion or high-grade proximal stenosis. The anterior cerebral arteries are patent. Hypoplastic right A1 segment 1-2 mm laterally projecting vascular protrusion arising from the cavernous right ICA which may reflect an aneurysm or the origin of an otherwise poorly delineated branch vessel (for instance as seen on series 5, image 58). 2 mm inferiorly projecting vascular protrusion arising from the distal cavernous left ICA compatible with an aneurysm (for instance as seen on series 5, image 63) (series 107, image 134). Posterior circulation: The visualized intracranial vertebral arteries are patent. The basilar artery is patent. The posterior cerebral arteries are patent. Posterior communicating arteries are diminutive or absent, bilaterally. Anatomic variants: As described. IMPRESSION: MRI brain: 1. No evidence of an acute intracranial abnormality. 2. No specific cause of facial pain/paresthesia is identified. If symptoms persist, a trigeminal nerve protocol MRI of the face (without and with contrast) may be obtained for further evaluation. 3. Mild chronic small vessel ischemic changes within the cerebral white matter. 4. Mild generalized cerebral atrophy. 5.  Small mucous retention cyst within the right maxillary sinus. 6. Small-volume fluid within the  bilateral mastoid air cells. MRA head: 1. No specific arterial cause of the reported symptoms is identified. 2. No intracranial large vessel occlusion or proximal high-grade arterial stenosis. 3. 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 4. 1-2 mm laterally projecting vascular protrusion arising from the cavernous right ICA, which may reflect an aneurysm or the origin of an otherwise poorly delineated branch vessel. Electronically Signed   By: Jackey Loge D.O.   On: 01/30/2023 18:08   MR Brain W Wo Contrast  Result Date: 01/30/2023 CLINICAL DATA:  Provided history: Facial paresthesia. Headache, cluster/trigeminal. Right trigeminal facial discomfort and paresthesia. EXAM: MRI HEAD WITHOUT AND WITH CONTRAST MRA HEAD WITHOUT CONTRAST TECHNIQUE: Multiplanar, multi-echo pulse sequences of the brain and surrounding structures were acquired without and with intravenous contrast. Angiographic images of the Circle of Willis were acquired using MRA technique without intravenous contrast. CONTRAST:  6 mL Vueway intravenous contrast. COMPARISON:  No pertinent prior exams available for comparison. FINDINGS: MRI HEAD FINDINGS A routine protocol brain MRI (without and with contrast) was ordered and performed. Brain: Mild generalized cerebral atrophy. Multifocal T2 FLAIR hyperintense signal abnormality within the cerebral white matter, nonspecific but compatible with mild chronic small vessel ischemic disease. There is no acute infarct. No evidence of an intracranial mass. No convincing chronic intracranial blood products. No extra-axial fluid collection. No midline shift. No pathologic intracranial enhancement identified. Vascular: Maintained flow voids within the proximal large arterial vessels. Skull and upper cervical spine: No focal suspicious marrow lesion. Incompletely assessed cervical spondylosis. Sinuses/Orbits: No mass or acute finding within the imaged orbits. Small mucous retention cyst within the  right maxillary sinus. Other: Small-volume fluid within the bilateral mastoid air cells. MRA HEAD FINDINGS Anterior circulation: The intracranial internal carotid arteries are patent. The M1 middle cerebral arteries are patent. No M2 proximal branch occlusion or high-grade proximal stenosis. The anterior cerebral arteries are patent. Hypoplastic right A1 segment 1-2 mm laterally projecting vascular protrusion arising from the cavernous right ICA which may reflect an aneurysm or the origin of an otherwise poorly delineated branch vessel (for instance as seen on series 5, image 58). 2 mm inferiorly projecting vascular protrusion arising from the distal cavernous left ICA compatible with an aneurysm (for instance as seen on series 5, image 63) (series 107, image 134). Posterior circulation: The visualized intracranial vertebral arteries are patent. The basilar artery is patent. The posterior cerebral arteries are patent. Posterior communicating arteries are diminutive or absent, bilaterally. Anatomic variants: As described. IMPRESSION: MRI brain: 1. No evidence of an acute intracranial abnormality. 2. No specific cause of facial pain/paresthesia is identified. If symptoms persist, a trigeminal nerve protocol MRI of the face (without and with contrast) may be obtained for further evaluation. 3. Mild chronic small vessel ischemic changes within the cerebral white matter. 4. Mild generalized cerebral atrophy. 5. Small mucous retention cyst within the right maxillary sinus. 6. Small-volume fluid within the bilateral mastoid air cells. MRA head: 1. No specific arterial cause of the reported symptoms is identified. 2. No intracranial large vessel occlusion or proximal high-grade arterial stenosis. 3. 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 4. 1-2 mm laterally projecting vascular protrusion arising from the cavernous right ICA, which may reflect an aneurysm or the origin of an otherwise poorly delineated  branch vessel. Electronically Signed   By: Jackey Loge D.O.   On: 01/30/2023 18:08  2 mm aneurysm arising from the cavernous R ICA    Patient Active Problem List   Diagnosis Date Noted   Facial paresthesia 01/19/2023   Right groin pain 10/10/2022   Current use of proton pump inhibitor 04/18/2022   Decreased GFR 04/18/2022   Vitamin B12 deficiency 04/18/2022   Multiple gastric ulcers 04/27/2021   Effusion of right knee joint 05/31/2020   Pain in right knee 05/31/2020   Pain in joint of left shoulder 05/20/2020   Pain in right foot 04/08/2020   Closed fracture of shaft of right fibula 01/08/2020   Pain in joint of right ankle 01/08/2020   Hematoma of left breast 01/02/2020   Carpal tunnel syndrome 05/27/2018   Pain in finger of right hand 04/24/2018   Pain in joint of right foot 04/24/2018   Hyperkalemia 02/01/2018   Encounter for screening mammogram for breast cancer 01/19/2017   Estrogen deficiency 01/19/2017   Routine general medical examination at a health care facility 06/11/2015   Encounter for Medicare annual wellness exam 11/14/2013   Family history of colon cancer 11/14/2013   Colon cancer screening 11/14/2013   Hypertension 01/27/2011   Osteoporosis 01/27/2011   Post-menopausal 01/27/2011   Low back pain radiating to left leg 03/13/2008   Hyperlipidemia 12/20/2007   CONSTIPATION, CHRONIC 12/20/2007   Prediabetes 12/20/2007   Past Medical History:  Diagnosis Date   Back pain    Chronic constipation    past hx- no current constipation 02-2021   Hyperlipidemia    Hypertension    Multiple gastric ulcers    Osteopenia    Single kidney    donated kidney    Past Surgical History:  Procedure Laterality Date   CLEFT PALATE REPAIR     COLONOSCOPY     FOOT NEUROMA SURGERY  2/06   NEPHRECTOMY LIVING DONOR     Left   TUBAL LIGATION     UPPER GASTROINTESTINAL ENDOSCOPY     Social History   Tobacco Use   Smoking status: Never   Smokeless tobacco: Never   Vaping Use   Vaping Use: Never used  Substance Use Topics   Alcohol use: No   Drug use: No   Family History  Problem Relation Age of Onset   Colon cancer Mother    Diabetes Mother    Coronary artery disease Mother    Osteoporosis Mother    Dementia Mother    Coronary artery disease Father    Heart failure Father    Hypertension Father    Stomach cancer Neg Hx    Pancreatic cancer Neg Hx    Esophageal cancer Neg Hx    Liver disease Neg Hx    Colon polyps Neg Hx    Rectal cancer Neg Hx    Breast cancer Neg Hx    Allergies  Allergen Reactions   Codeine    Hydrocodone Bit-Homatrop Mbr     Caused flu-like symptoms   Current Outpatient Medications on File Prior to Visit  Medication Sig Dispense Refill   aspirin 81 MG tablet Take 81 mg by mouth daily.     atorvastatin (LIPITOR) 10 MG tablet TAKE 1 TABLET BY MOUTH EVERY DAY 90 tablet 3   Cyanocobalamin (B-12) 1000 MCG CAPS Take 1 capsule by mouth daily.     D 1000 25 MCG (1000 UT) capsule SMARTSIG:1 By Mouth     lisinopril (ZESTRIL) 10 MG tablet Take 1 tablet (10 mg total) by mouth daily. 90 tablet 3   Misc Natural  Products (RA GLUCOSAMINE-CHONDROIT-MSM-D) TABS      pantoprazole (PROTONIX) 40 MG tablet Take 1 tablet (40 mg total) by mouth 2 (two) times daily. Office visit for further refills 180 tablet 0   No current facility-administered medications on file prior to visit.    Review of Systems  Constitutional:  Negative for activity change, appetite change, fatigue, fever and unexpected weight change.  HENT:  Negative for congestion, ear pain, rhinorrhea, sinus pressure and sore throat.   Eyes:  Negative for pain, redness and visual disturbance.  Respiratory:  Negative for cough, shortness of breath and wheezing.   Cardiovascular:  Negative for chest pain and palpitations.  Gastrointestinal:  Negative for abdominal pain, blood in stool, constipation and diarrhea.  Endocrine: Negative for polydipsia and polyuria.   Genitourinary:  Negative for dysuria, frequency and urgency.  Musculoskeletal:  Negative for arthralgias, back pain and myalgias.  Skin:  Negative for pallor and rash.  Allergic/Immunologic: Negative for environmental allergies.  Neurological:  Negative for dizziness, tremors, seizures, syncope, facial asymmetry, speech difficulty, weakness, light-headedness and numbness.       Frontal head pressure /not pain   Tingling of R face on and off  Hematological:  Negative for adenopathy. Does not bruise/bleed easily.  Psychiatric/Behavioral:  Negative for decreased concentration and dysphoric mood. The patient is not nervous/anxious.        Stressors noted  A little foggy       Objective:   Physical Exam Constitutional:      General: She is not in acute distress.    Appearance: She is well-developed and normal weight. She is not ill-appearing or diaphoretic.  HENT:     Head: Normocephalic and atraumatic.     Comments: No facial swelling or tenderness  Eyes:     Conjunctiva/sclera: Conjunctivae normal.     Pupils: Pupils are equal, round, and reactive to light.  Neck:     Thyroid: No thyromegaly.     Vascular: No carotid bruit or JVD.  Cardiovascular:     Rate and Rhythm: Normal rate and regular rhythm.     Heart sounds: Normal heart sounds.     No gallop.  Pulmonary:     Effort: Pulmonary effort is normal. No respiratory distress.     Breath sounds: Normal breath sounds. No wheezing or rales.  Abdominal:     General: There is no distension or abdominal bruit.     Palpations: Abdomen is soft.  Musculoskeletal:     Cervical back: Normal range of motion and neck supple.     Right lower leg: No edema.     Left lower leg: No edema.  Lymphadenopathy:     Cervical: No cervical adenopathy.  Skin:    General: Skin is warm and dry.     Coloration: Skin is not jaundiced or pale.     Findings: No bruising, erythema or rash.  Neurological:     Mental Status: She is alert.      Cranial Nerves: No cranial nerve deficit.     Motor: No weakness.     Coordination: Coordination normal.     Gait: Gait normal.     Deep Tendon Reflexes: Reflexes are normal and symmetric. Reflexes normal.  Psychiatric:        Mood and Affect: Mood normal.           Assessment & Plan:   Problem List Items Addressed This Visit       Cardiovascular and Mediastinum   Hypertension -  Primary    Improved with increase of lisinopril to 10 mg daily   bp in fair control at this time  BP Readings from Last 1 Encounters:  02/19/23 131/65  No changes needed Most recent labs reviewed  Disc lifstyle change with low sodium diet and exercise          Other   Facial paresthesia    This is improved / mild with gabapentin 100 mg bid but that medicine may be causing head pressure symptoms and fogginess as well  Instructed her to hold it and see neurology in June as planned Reviewed her MRI/MRA Overall reassuring  Did have a 2 mm aneurysm arising from R ICA - suspect not related   Has neuro appt upcoming Instructed pt to alert [place] if any changes Er precautions noted

## 2023-02-19 NOTE — Assessment & Plan Note (Signed)
This is improved / mild with gabapentin 100 mg bid but that medicine may be causing head pressure symptoms and fogginess as well  Instructed her to hold it and see neurology in June as planned Reviewed her MRI/MRA Overall reassuring  Did have a 2 mm aneurysm arising from R ICA - suspect not related   Has neuro appt upcoming Instructed pt to alert [place] if any changes Er precautions noted

## 2023-02-19 NOTE — Assessment & Plan Note (Signed)
Improved with increase of lisinopril to 10 mg daily   bp in fair control at this time  BP Readings from Last 1 Encounters:  02/19/23 131/65   No changes needed Most recent labs reviewed  Disc lifstyle change with low sodium diet and exercise

## 2023-02-28 NOTE — Progress Notes (Signed)
Initial neurology clinic note  [name] MRN: [id number] DOB: 25-Aug-1941  Referring provider: Judy Pimple, MD  Primary care provider: Judy Pimple, MD  Reason for consult:  tingling in right face and neck  Subjective:  This is [name], a 82 y.o. female with a medical history of HTN, HLD, GERD, B12 deficiency, single kidney (donated other) who presents to neurology clinic with right facial and neck paresthesias. The patient is alone today.  Patient was being seen at [facility] for precancer on her face. She was using fluorourcil on her face starting around 11/2022. She did this for 2 weeks. Per patient, it pulls the precancer cells out. She shows me a picture of her face after treatment with changes throughout her face. During treatment (late 12/2022) she started having an abnormal sensation on the right neck into lower right face. She can rub her right jaw and help. It is happening 2-3 times per day, lasting a few seconds to 1 minute. It feels tingling, like something is crawling up her face. It is not a sharp or shooting pain that does not hurt. She has no lacrimation, ptosis, facial swelling, face drooping, or rhinorrhea. She denies neck pain or muscle tightness. She denies any triggers, including touching her face, brushing her teeth, or chewing.  She denies significant headaches or vision changes. She denies tingling or numbness in arms or legs.  She did do physical therapy for back and leg pain in 10/2022.  Patient takes B12 and vit D. She denies taking B complex.  MEDICATIONS:  Outpatient Encounter Medications as of 03/14/2023  Medication Sig   aspirin 81 MG tablet Take 81 mg by mouth daily.   atorvastatin (LIPITOR) 10 MG tablet TAKE 1 TABLET BY MOUTH EVERY DAY   Cyanocobalamin (B-12) 1000 MCG CAPS Take 1 capsule by mouth daily.   D 1000 25 MCG (1000 UT) capsule SMARTSIG:1 By Mouth   lisinopril (ZESTRIL) 10 MG tablet Take 1 tablet (10 mg total) by  mouth daily.   Misc Natural Products (RA GLUCOSAMINE-CHONDROIT-MSM-D) TABS    pantoprazole (PROTONIX) 40 MG tablet Take 1 tablet (40 mg total) by mouth 2 (two) times daily. Office visit for further refills   No facility-administered encounter medications on file as of 03/14/2023.    PAST MEDICAL HISTORY: Past Medical History:  Diagnosis Date   Back pain    Chronic constipation    past hx- no current constipation 02-2021   Hyperlipidemia    Hypertension    Multiple gastric ulcers    Osteopenia    Single kidney    donated kidney     PAST SURGICAL HISTORY: Past Surgical History:  Procedure Laterality Date   CLEFT PALATE REPAIR     COLONOSCOPY     FOOT NEUROMA SURGERY  2/06   NEPHRECTOMY LIVING DONOR     Left   TUBAL LIGATION     UPPER GASTROINTESTINAL ENDOSCOPY      ALLERGIES: Allergies  Allergen Reactions   Codeine    Hydrocodone Bit-Homatrop Mbr     Caused flu-like symptoms    FAMILY HISTORY: Family History  Problem Relation Age of Onset   Colon cancer Mother    Diabetes Mother    Coronary artery disease Mother    Osteoporosis Mother    Dementia Mother    Coronary artery disease Father    Heart failure Father    Hypertension Father    Stomach cancer Neg Hx    Pancreatic cancer  Neg Hx    Esophageal cancer Neg Hx    Liver disease Neg Hx    Colon polyps Neg Hx    Rectal cancer Neg Hx    Breast cancer Neg Hx     SOCIAL HISTORY: Social History   Tobacco Use   Smoking status: Never   Smokeless tobacco: Never  Vaping Use   Vaping Use: Never used  Substance Use Topics   Alcohol use: No   Drug use: No   Social History   Social History Narrative   Left handed     Objective:  Vital Signs:  BP 124/80   Pulse 72   Ht 5' 1.5" (1.562 m)   Wt 135 lb 6.4 oz (61.4 kg)   SpO2 98%   BMI 25.17 kg/m   General: No acute distress.  Patient appears well-groomed.   Head:  Normocephalic/atraumatic Neck: supple, mild paraspinal/trapezius tenderness,  tightness of trapezius muscle with reduced range of motion Heart: regular rate and rhythm Lungs: Clear to auscultation bilaterally. Vascular: No carotid bruits.  Neurological Exam: Mental status: alert and oriented, speech fluent and not dysarthric, language intact.  Cranial nerves: CN I: not tested CN II: pupils equal, round and reactive to light, visual fields intact CN III, IV, VI:  full range of motion, no nystagmus, no ptosis CN V: facial sensation intact. While patient had abnormal right neck and facial sensation during appointment, I was not able to trigger with touching face or pressure on the neck. CN VII: mild asymmetry of face with smile on right. Eye closure strong and equal. Frontalis strong and equal. CN VIII: hearing intact CN IX, X: uvula midline CN XI: sternocleidomastoid and trapezius muscles intact CN XII: tongue midline  Bulk & Tone: normal. Motor:  muscle strength 5/5 throughout Deep Tendon Reflexes:  2+ throughout.   Sensation:  Light touch sensation intact. Finger to nose testing:  Without dysmetria.   Gait:  Normal station and stride.  Romberg negative.   Labs and Imaging review: Internal labs: Lab Results  Component Value Date   HGBA1C 6.1 04/18/2022   Lab Results  Component Value Date   VITAMINB12 826 01/19/2023   Lab Results  Component Value Date   TSH 2.33 04/18/2022   Lab Results  Component Value Date   ESRSEDRATE 4 01/19/2023   CBC and BMP (01/19/23) unremarkable  Imaging: MRI brain/MRA head w/wo contrast (01/30/23): FINDINGS: MRI HEAD FINDINGS   A routine protocol brain MRI (without and with contrast) was ordered and performed.   Brain:   Mild generalized cerebral atrophy.   Multifocal T2 FLAIR hyperintense signal abnormality within the cerebral white matter, nonspecific but compatible with mild chronic small vessel ischemic disease.   There is no acute infarct.   No evidence of an intracranial mass.   No convincing  chronic intracranial blood products.   No extra-axial fluid collection.   No midline shift.   No pathologic intracranial enhancement identified.   Vascular: Maintained flow voids within the proximal large arterial vessels.   Skull and upper cervical spine: No focal suspicious marrow lesion. Incompletely assessed cervical spondylosis.   Sinuses/Orbits: No mass or acute finding within the imaged orbits. Small mucous retention cyst within the right maxillary sinus.   Other: Small-volume fluid within the bilateral mastoid air cells.   MRA HEAD FINDINGS   Anterior circulation:   The intracranial internal carotid arteries are patent. The M1 middle cerebral arteries are patent. No M2 proximal branch occlusion or high-grade proximal stenosis. The anterior  cerebral arteries are patent. Hypoplastic right A1 segment 1-2 mm laterally projecting vascular protrusion arising from the cavernous right ICA which may reflect an aneurysm or the origin of an otherwise poorly delineated branch vessel (for instance as seen on series 5, image 58). 2 mm inferiorly projecting vascular protrusion arising from the distal cavernous left ICA compatible with an aneurysm (for instance as seen on series 5, image 63) (series 107, image 134).   Posterior circulation:   The visualized intracranial vertebral arteries are patent. The basilar artery is patent. The posterior cerebral arteries are patent. Posterior communicating arteries are diminutive or absent, bilaterally.   Anatomic variants: As described.   IMPRESSION: MRI brain: 1. No evidence of an acute intracranial abnormality. 2. No specific cause of facial pain/paresthesia is identified. If symptoms persist, a trigeminal nerve protocol MRI of the face (without and with contrast) may be obtained for further evaluation. 3. Mild chronic small vessel ischemic changes within the cerebral white matter. 4. Mild generalized cerebral atrophy. 5. Small  mucous retention cyst within the right maxillary sinus. 6. Small-volume fluid within the bilateral mastoid air cells.   MRA head: 1. No specific arterial cause of the reported symptoms is identified. 2. No intracranial large vessel occlusion or proximal high-grade arterial stenosis. 3. 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 4. 1-2 mm laterally projecting vascular protrusion arising from the cavernous right ICA, which may reflect an aneurysm or the origin of an otherwise poorly delineated branch vessel.  Assessment/Plan:  [name] is a 82 y.o. female who presents for evaluation of transient right face and neck paresthesias. She has a relevant medical history of HTN, HLD, GERD, B12 deficiency, single kidney. Her neurological examination is essentially normal today. She has some mild facial asymmetry with smile that patient states is normal for her. Available diagnostic data is significant for MRI and MRA brain that was normal. The etiology of patient's symptoms is currently unclear. Trigeminal neuralgia is a consideration, but symptoms also include the neck and are not painful, which would be usual. Cervical radiculopathy is another consideration, but would be expected to be more painful and would not be expected to affect the face. It is possible that neck tension/tightness is causing the paresthesias. Overall, patient does not find the symptoms bothersome and prefers to avoid medication unless needed.  PLAN: -Discussed PT for neck tightness but patient would prefer to do home exercises found online. -Patient would prefer conservative management prior to considering medication, which is very reasonable in this case. -Patient will call if symptoms worsen or do not improve -If symptoms begin to appear more consistent with trigeminal neuralgia, could consider MRI trigeminal protocol.  -Return to clinic as needed  The impression above as well as the plan as outlined below  were extensively discussed with the patient who voiced understanding. All questions were answered to their satisfaction.  When available, results of the above investigations and possible further recommendations will be communicated to the patient via telephone/MyChart. Patient to call office if not contacted after expected testing turnaround time.    Thank you for allowing me to participate in patient's care.  If I can answer any additional questions, I would be pleased to do so.  Jacquelyne Balint, MD   CC: Tower, Audrie Gallus, MD [address] [place] [address]  CC: Referring provider: Judy Pimple, MD [address] [place],  [place] [address]

## 2023-03-13 ENCOUNTER — Ambulatory Visit (INDEPENDENT_AMBULATORY_CARE_PROVIDER_SITE_OTHER): Payer: Medicare HMO

## 2023-03-13 VITALS — Wt 134.0 lb

## 2023-03-13 DIAGNOSIS — Z0000 Encounter for general adult medical examination without abnormal findings: Secondary | ICD-10-CM

## 2023-03-13 NOTE — Patient Instructions (Signed)
[name] , Thank you for taking time to come for your Medicare Wellness Visit. I appreciate your ongoing commitment to your health goals. Please review the following plan we discussed and let me know if I can assist you in the future.   These are the goals we discussed:  Goals      Increase physical activity     02/28/2022, I will attempt to walk at least 1 mile daily.         This is a list of the screening recommended for you and due dates:  Health Maintenance  Topic Date Due   DTaP/Tdap/Td vaccine (3 - Td or Tdap) 01/26/2021   COVID-19 Vaccine (4 - 2023-24 season) 03/19/2024*   Flu Shot  05/10/2023   Mammogram  06/17/2023   Medicare Annual Wellness Visit  03/12/2024   Pneumonia Vaccine  Completed   DEXA scan (bone density measurement)  Completed   Zoster (Shingles) Vaccine  Completed   HPV Vaccine  Aged Out  *Topic was postponed. The date shown is not the original due date.    Advanced directives: Advance directive discussed with you today. Even though you declined this today, please call our office should you change your mind, and we can give you the proper paperwork for you to fill out.   Conditions/risks identified: Aim for 30 minutes of exercise or brisk walking, 6-8 glasses of water, and 5 servings of fruits and vegetables each day.   Next appointment: Follow up in one year for your annual wellness visit. 03/13/24  Preventive Care 81 Years and Older, Female  Preventive care refers to lifestyle choices and visits with your health care provider that can promote health and wellness. What does preventive care include? A yearly physical exam. This is also called an annual well check. Dental exams once or twice a year. Routine eye exams. Ask your health care provider how often you should have your eyes checked. Personal lifestyle choices, including: Daily care of your teeth and gums. Regular physical activity. Eating a healthy diet. Avoiding tobacco and drug  use. Limiting alcohol use. Practicing safe sex. Taking low doses of aspirin every day. Taking vitamin and mineral supplements as recommended by your health care provider. What happens during an annual well check? The services and screenings done by your health care provider during your annual well check will depend on your age, overall health, lifestyle risk factors, and family history of disease. Counseling  Your health care provider may ask you questions about your: Alcohol use. Tobacco use. Drug use. Emotional well-being. Home and relationship well-being. Sexual activity. Eating habits. History of falls. Memory and ability to understand (cognition). Work and work Astronomer. Screening  You may have the following tests or measurements: Height, weight, and BMI. Blood pressure. Lipid and cholesterol levels. These may be checked every 5 years, or more frequently if you are over 40 years old. Skin check. Lung cancer screening. You may have this screening every year starting at age 26 if you have a 30-pack-year history of smoking and currently smoke or have quit within the past 15 years. Fecal occult blood test (FOBT) of the stool. You may have this test every year starting at age 34. Flexible sigmoidoscopy or colonoscopy. You may have a sigmoidoscopy every 5 years or a colonoscopy every 10 years starting at age 38. Prostate cancer screening. Recommendations will vary depending on your family history and other risks. Hepatitis C blood test. Hepatitis B blood test. Sexually transmitted disease (STD) testing. Diabetes  screening. This is done by checking your blood sugar (glucose) after you have not eaten for a while (fasting). You may have this done every 1-3 years. Abdominal aortic aneurysm (AAA) screening. You may need this if you are a current or former smoker. Osteoporosis. You may be screened starting at age 36 if you are at high risk. Talk with your health care provider about  your test results, treatment options, and if necessary, the need for more tests. Vaccines  Your health care provider may recommend certain vaccines, such as: Influenza vaccine. This is recommended every year. Tetanus, diphtheria, and acellular pertussis (Tdap, Td) vaccine. You may need a Td booster every 10 years. Zoster vaccine. You may need this after age 22. Pneumococcal 13-valent conjugate (PCV13) vaccine. One dose is recommended after age 59. Pneumococcal polysaccharide (PPSV23) vaccine. One dose is recommended after age 53. Talk to your health care provider about which screenings and vaccines you need and how often you need them. This information is not intended to replace advice given to you by your health care provider. Make sure you discuss any questions you have with your health care provider. Document Released: 10/22/2015 Document Revised: 06/14/2016 Document Reviewed: 07/27/2015 Elsevier Interactive Patient Education  2017 [employer].  Fall Prevention in the Home Falls can cause injuries. They can happen to people of all ages. There are many things you can do to make your home safe and to help prevent falls. What can I do on the outside of my home? Regularly fix the edges of walkways and driveways and fix any cracks. Remove anything that might make you trip as you walk through a door, such as a raised step or threshold. Trim any bushes or trees on the path to your home. Use bright outdoor lighting. Clear any walking paths of anything that might make someone trip, such as rocks or tools. Regularly check to see if handrails are loose or broken. Make sure that both sides of any steps have handrails. Any raised decks and porches should have guardrails on the edges. Have any leaves, snow, or ice cleared regularly. Use sand or salt on walking paths during winter. Clean up any spills in your garage right away. This includes oil or grease spills. What can I do in the bathroom? Use  night lights. Install grab bars by the toilet and in the tub and shower. Do not use towel bars as grab bars. Use non-skid mats or decals in the tub or shower. If you need to sit down in the shower, use a plastic, non-slip stool. Keep the floor dry. Clean up any water that spills on the floor as soon as it happens. Remove soap buildup in the tub or shower regularly. Attach bath mats securely with double-sided non-slip rug tape. Do not have throw rugs and other things on the floor that can make you trip. What can I do in the bedroom? Use night lights. Make sure that you have a light by your bed that is easy to reach. Do not use any sheets or blankets that are too big for your bed. They should not hang down onto the floor. Have a firm chair that has side arms. You can use this for support while you get dressed. Do not have throw rugs and other things on the floor that can make you trip. What can I do in the kitchen? Clean up any spills right away. Avoid walking on wet floors. Keep items that you use a lot in easy-to-reach places.  If you need to reach something above you, use a strong step stool that has a grab bar. Keep electrical cords out of the way. Do not use floor polish or wax that makes floors slippery. If you must use wax, use non-skid floor wax. Do not have throw rugs and other things on the floor that can make you trip. What can I do with my stairs? Do not leave any items on the stairs. Make sure that there are handrails on both sides of the stairs and use them. Fix handrails that are broken or loose. Make sure that handrails are as long as the stairways. Check any carpeting to make sure that it is firmly attached to the stairs. Fix any carpet that is loose or worn. Avoid having throw rugs at the top or bottom of the stairs. If you do have throw rugs, attach them to the floor with carpet tape. Make sure that you have a light switch at the top of the stairs and the bottom of the  stairs. If you do not have them, ask someone to add them for you. What else can I do to help prevent falls? Wear shoes that: Do not have high heels. Have rubber bottoms. Are comfortable and fit you well. Are closed at the toe. Do not wear sandals. If you use a stepladder: Make sure that it is fully opened. Do not climb a closed stepladder. Make sure that both sides of the stepladder are locked into place. Ask someone to hold it for you, if possible. Clearly mark and make sure that you can see: Any grab bars or handrails. First and last steps. Where the edge of each step is. Use tools that help you move around (mobility aids) if they are needed. These include: Canes. Walkers. Scooters. Crutches. Turn on the lights when you go into a dark area. Replace any light bulbs as soon as they burn out. Set up your furniture so you have a clear path. Avoid moving your furniture around. If any of your floors are uneven, fix them. If there are any pets around you, be aware of where they are. Review your medicines with your doctor. Some medicines can make you feel dizzy. This can increase your chance of falling. Ask your doctor what other things that you can do to help prevent falls. This information is not intended to replace advice given to you by your health care provider. Make sure you discuss any questions you have with your health care provider. Document Released: 07/22/2009 Document Revised: 03/02/2016 Document Reviewed: 10/30/2014 Elsevier Interactive Patient Education  2017 [employer].

## 2023-03-13 NOTE — Progress Notes (Signed)
Subjective:   [name] is a 82 y.o. female who presents for Medicare Annual (Subsequent) preventive examination.  Review of Systems    I connected with  [facility] on 03/13/23 by a audio enabled telemedicine application and verified that I am speaking with the correct person using two identifiers.  Patient Location: Home  Provider Location: Home Office  I discussed the limitations of evaluation and management by telemedicine. The patient expressed understanding and agreed to proceed.  Cardiac Risk Factors include: advanced age (>93men, >24 women);hypertension     Objective:    Today's Vitals   03/13/23 1003  Weight: 134 lb (60.8 kg)   Body mass index is 24.91 kg/m.     03/13/2023   10:11 AM 10/26/2022   12:29 PM 09/14/2022   12:47 PM 02/28/2022    4:26 PM 02/22/2021    3:30 PM 12/02/2020   10:24 AM 02/17/2020   10:35 AM  Advanced Directives  Does Patient Have a Medical Advance Directive? Yes Yes No Yes Yes Yes Yes  Type of Estate agent of [place];Living will Healthcare Power of [place];Living will  Healthcare Power of [place];Living will Healthcare Power of [place];Living will Healthcare Power of [place];Living will Healthcare Power of [place];Living will  Does patient want to make changes to medical advance directive?  No - Patient declined       Copy of Healthcare Power of Attorney in Chart? No - copy requested No - copy requested  No - copy requested No - copy requested  No - copy requested  Would patient like information on creating a medical advance directive?   No - Patient declined        Current Medications (verified) Outpatient Encounter Medications as of 03/13/2023  Medication Sig   aspirin 81 MG tablet Take 81 mg by mouth daily.   atorvastatin (LIPITOR) 10 MG tablet TAKE 1 TABLET BY MOUTH EVERY DAY   Cyanocobalamin (B-12) 1000 MCG CAPS Take 1 capsule by mouth daily.   D 1000 25 MCG (1000 UT) capsule SMARTSIG:1 By Mouth    lisinopril (ZESTRIL) 10 MG tablet Take 1 tablet (10 mg total) by mouth daily.   Misc Natural Products (RA GLUCOSAMINE-CHONDROIT-MSM-D) TABS    pantoprazole (PROTONIX) 40 MG tablet Take 1 tablet (40 mg total) by mouth 2 (two) times daily. Office visit for further refills   No facility-administered encounter medications on file as of 03/13/2023.    Allergies (verified) Codeine and Hydrocodone bit-homatrop mbr   History: Past Medical History:  Diagnosis Date   Back pain    Chronic constipation    past hx- no current constipation 02-2021   Hyperlipidemia    Hypertension    Multiple gastric ulcers    Osteopenia    Single kidney    donated kidney    Past Surgical History:  Procedure Laterality Date   CLEFT PALATE REPAIR     COLONOSCOPY     FOOT NEUROMA SURGERY  2/06   NEPHRECTOMY LIVING DONOR     Left   TUBAL LIGATION     UPPER GASTROINTESTINAL ENDOSCOPY     Family History  Problem Relation Age of Onset   Colon cancer Mother    Diabetes Mother    Coronary artery disease Mother    Osteoporosis Mother    Dementia Mother    Coronary artery disease Father    Heart failure Father    Hypertension Father    Stomach cancer Neg Hx    Pancreatic cancer Neg  Hx    Esophageal cancer Neg Hx    Liver disease Neg Hx    Colon polyps Neg Hx    Rectal cancer Neg Hx    Breast cancer Neg Hx    Social History   Socioeconomic History   Marital status: Married    Spouse name: Renae Fickle   Number of children: Not on file   Years of education: Not on file   Highest education level: Not on file  Occupational History   Occupation: works at a church  Tobacco Use   Smoking status: Never   Smokeless tobacco: Never  Vaping Use   Vaping Use: Never used  Substance and Sexual Activity   Alcohol use: No   Drug use: No   Sexual activity: Not Currently  Other Topics Concern   Not on file  Social History Narrative   Not on file   Social Determinants of Health   Financial Resource Strain: Low  Risk  (03/13/2023)   Overall Financial Resource Strain (CARDIA)    Difficulty of Paying Living Expenses: Not hard at all  Food Insecurity: No Food Insecurity (03/13/2023)   Hunger Vital Sign    Worried About Running Out of Food in the Last Year: Never true    Ran Out of Food in the Last Year: Never true  Transportation Needs: No Transportation Needs (03/13/2023)   PRAPARE - Administrator, Civil Service (Medical): No    Lack of Transportation (Non-Medical): No  Physical Activity: Sufficiently Active (03/13/2023)   Exercise Vital Sign    Days of Exercise per Week: 7 days    Minutes of Exercise per Session: 30 min  Stress: No Stress Concern Present (03/13/2023)   [employer] of Occupational Health - Occupational Stress Questionnaire    Feeling of Stress : Not at all  Social Connections: Socially Integrated (03/13/2023)   Social Connection and Isolation Panel [NHANES]    Frequency of Communication with Friends and Family: More than three times a week    Frequency of Social Gatherings with Friends and Family: More than three times a week    Attends Religious Services: More than 4 times per year    Active Member of [employer] or Organizations: Yes    Attends Engineer, structural: More than 4 times per year    Marital Status: Married    Tobacco Counseling Counseling given: Yes   Clinical Intake:  Pre-visit preparation completed: Yes  Pain : No/denies pain     BMI - recorded: 24.91 Nutritional Status: BMI 25 -29 Overweight Nutritional Risks: None Diabetes: No  How often do you need to have someone help you when you read instructions, pamphlets, or other written materials from your doctor or pharmacy?: 1 - Never  Diabetic?NO  Interpreter Needed?: No  Information entered by :: Fredirick Maudlin   Activities of Daily Living    03/13/2023   10:13 AM  In your present state of health, do you have any difficulty performing the following activities:  Hearing? 1   Vision? 0  Difficulty concentrating or making decisions? 1  Comment sometimes with memory  Walking or climbing stairs? 0  Dressing or bathing? 0  Doing errands, shopping? 0  Preparing Food and eating ? N  Using the Toilet? N  In the past six months, have you accidently leaked urine? Y  Do you have problems with loss of bowel control? N  Managing your Medications? N  Managing your Finances? N  Housekeeping or managing your Housekeeping?  N    Patient Care Team: Tower, Audrie Gallus, MD as PCP - General Janet Berlin, MD as Consulting Physician (Ophthalmology)  Indicate any recent Medical Services you may have received from other than [facility] providers in the past year (date may be approximate).     Assessment:   This is a routine wellness examination for [place].  Hearing/Vision screen Hearing Screening - Comments:: Notice some hearing loss Vision Screening - Comments:: Wears rx glasses - up to date with routine eye exams with  Dr.Tanner  Dietary issues and exercise activities discussed: Current Exercise Habits: Home exercise routine, Type of exercise: walking;stretching;yoga, Time (Minutes): 15, Frequency (Times/Week): 5, Weekly Exercise (Minutes/Week): 75, Intensity: Moderate   Goals Addressed             This Visit's Progress    Increase physical activity   Not on track    02/28/2022, I will attempt to walk at least 1 mile daily.       Depression Screen    03/13/2023   10:07 AM 02/19/2023    8:29 AM 01/19/2023    8:29 AM 04/18/2022    9:26 AM 02/28/2022    4:10 PM 02/22/2021    3:32 PM 02/17/2020   10:36 AM  PHQ 2/9 Scores  PHQ - 2 Score 0 0 0 0 0 0 0  PHQ- 9 Score 0 2 1   0 0    Fall Risk    03/13/2023   10:12 AM 02/19/2023    8:29 AM 01/19/2023    8:29 AM 04/18/2022    9:26 AM 02/28/2022    4:07 PM  Fall Risk   Falls in the past year? 0 0 0 0 1  Number falls in past yr: 0 0 0  0  Injury with Fall? 0 0 0  1  Risk for fall due to : No Fall Risks No Fall Risks No Fall  Risks  History of fall(s);Orthopedic patient  Follow up Falls prevention discussed;Falls evaluation completed Falls evaluation completed Falls evaluation completed Falls evaluation completed Education provided;Falls prevention discussed    FALL RISK PREVENTION PERTAINING TO THE HOME:  Any stairs in or around the home? Yes  If so, are there any without handrails? No  Home free of loose throw rugs in walkways, pet beds, electrical cords, etc? No  Adequate lighting in your home to reduce risk of falls? Yes   ASSISTIVE DEVICES UTILIZED TO PREVENT FALLS:  Life alert? No  Use of a cane, [place] or w/c? No  Grab bars in the bathroom? Yes  Shower chair or bench in shower? No  Elevated toilet seat or a handicapped toilet? Yes   TIMED UP AND GO:  Was the test performed?  NO ,televisit  .     Cognitive Function:    02/22/2021    3:38 PM 02/17/2020   10:41 AM 01/29/2018    9:16 AM 01/12/2017    8:44 AM  MMSE - Mini Mental State Exam  Orientation to time 5 5 5 5   Orientation to Place 5 5 5 5   Registration 3 3 3 3   Attention/ Calculation 5 5 0 0  Recall 3 3 3 2   Recall-comments    pt was unable to recall 1 of 3 words  Language- name 2 objects   0 0  Language- repeat 1 1 1 1   Language- follow 3 step command   3 3  Language- read & follow direction   0 0  Write a sentence  0 0  Copy design   0 0  Total score   20 19        03/13/2023   10:08 AM 02/28/2022    4:11 PM  6CIT Screen  What Year? 0 points 0 points  What month? 0 points 0 points  What time? 0 points 0 points  Count back from 20 0 points 0 points  Months in reverse 0 points 0 points  Repeat phrase 0 points 0 points  Total Score 0 points 0 points    Immunizations Immunization History  Administered Date(s) Administered   Fluad Quad(high Dose 65+) 06/28/2022   Influenza Split 09/20/2012   Influenza, High Dose Seasonal PF 06/26/2018, 07/09/2019   Influenza,inj,Quad PF,6+ Mos 06/11/2015   Influenza-Unspecified  07/09/2016   PFIZER(Purple Top)SARS-COV-2 Vaccination 10/31/2019, 11/22/2019, 08/09/2020   Pneumococcal Conjugate-13 06/11/2015   Pneumococcal Polysaccharide-23 11/14/2013   Td 03/31/2002   Tdap 01/27/2011   Zoster Recombinat (Shingrix) 07/23/2019, 04/21/2022   Zoster, Live 11/11/2011    TDAP status: Due, Education has been provided regarding the importance of this vaccine. Advised may receive this vaccine at local pharmacy or Health Dept. Aware to provide a copy of the vaccination record if obtained from local pharmacy or Health Dept. Verbalized acceptance and understanding.  Flu Vaccine status: Up to date  Pneumococcal vaccine status: Up to date  Covid-19 vaccine status: Completed vaccines  Qualifies for Shingles Vaccine? Yes   Zostavax completed Yes   Shingrix Completed?: Yes  Screening Tests Health Maintenance  Topic Date Due   DTaP/Tdap/Td (3 - Td or Tdap) 01/26/2021   COVID-19 Vaccine (4 - 2023-24 season) 03/19/2024 (Originally 06/09/2022)   INFLUENZA VACCINE  05/10/2023   MAMMOGRAM  06/17/2023   Medicare Annual Wellness (AWV)  03/12/2024   Pneumonia Vaccine 36+ Years old  Completed   DEXA SCAN  Completed   Zoster Vaccines- Shingrix  Completed   HPV VACCINES  Aged Out    Health Maintenance  Health Maintenance Due  Topic Date Due   DTaP/Tdap/Td (3 - Td or Tdap) 01/26/2021    Colorectal cancer screening: No longer required.   Mammogram status: Completed 06/16/22. Repeat every year  Bone Density status: Completed 11/09/22. Results reflect: Bone density results: OSTEOPENIA. Repeat every 10 years.  Lung Cancer Screening: (Low Dose CT Chest recommended if Age 34-80 years, 30 pack-year currently smoking OR have quit w/in 15years.) does not qualify.   Lung Cancer Screening Referral: no  Additional Screening:  Hepatitis C Screening: does not qualify; aged out  Vision Screening: Recommended annual ophthalmology exams for early detection of glaucoma and other  disorders of the eye. Is the patient up to date with their annual eye exam?  Yes  Who is the provider or what is the name of the office in which the patient attends annual eye exams? Dr Burgess Estelle If pt is not established with a provider, would they like to be referred to a provider to establish care? No .   Dental Screening: Recommended annual dental exams for proper oral hygiene  Community Resource Referral / Chronic Care Management: CRR required this visit?  No   CCM required this visit?  No      Plan:     I have personally reviewed and noted the following in the patient's chart:   Medical and social history Use of alcohol, tobacco or illicit drugs  Current medications and supplements including opioid prescriptions. Patient is not currently taking opioid prescriptions. Functional ability and status Nutritional status Physical activity Advanced  directives List of other physicians Hospitalizations, surgeries, and ER visits in previous 12 months Vitals Screenings to include cognitive, depression, and falls Referrals and appointments  In addition, I have reviewed and discussed with patient certain preventive protocols, quality metrics, and best practice recommendations. A written personalized care plan for preventive services as well as general preventive health recommendations were provided to patient.     Annabell Sabal, CMA   03/13/2023   Nurse Notes: none

## 2023-03-14 ENCOUNTER — Ambulatory Visit: Payer: Medicare HMO | Admitting: Neurology

## 2023-03-14 ENCOUNTER — Encounter: Payer: Self-pay | Admitting: Neurology

## 2023-03-14 VITALS — BP 124/80 | HR 72 | Ht 61.5 in | Wt 135.4 lb

## 2023-03-14 DIAGNOSIS — R29898 Other symptoms and signs involving the musculoskeletal system: Secondary | ICD-10-CM | POA: Diagnosis not present

## 2023-03-14 DIAGNOSIS — R200 Anesthesia of skin: Secondary | ICD-10-CM | POA: Diagnosis not present

## 2023-03-14 DIAGNOSIS — R202 Paresthesia of skin: Secondary | ICD-10-CM | POA: Diagnosis not present

## 2023-03-14 NOTE — Patient Instructions (Addendum)
I saw you today for the abnormal sensation in your right face and neck. I am not sure the cause of your symptoms. The symptoms do not follow a clear nerve distribution to think this is a pinched nerve, but this is still possible.   Given that your current symptoms are mild, we agreed to try home exercises for loosening up your neck and monitor the symptoms. We discussed physical therapy for your neck, but you would rather try home exercises first. If symptoms worsen, become bothersome, or do not improve, we can re-evaluate.  [facility] has a good website with exercises for the neck: [email or web address]  Follow up with me as needed.  The physicians and staff at [facility] are committed to providing excellent care. You may receive a survey requesting feedback about your experience at our office. We strive to receive "very good" responses to the survey questions. If you feel that your experience would prevent you from giving the office a "very good " response, please contact our office to try to remedy the situation. We may be reached at [phone]. Thank you for taking the time out of your busy day to complete the survey.  Jacquelyne Balint, MD [facility]

## 2023-04-14 ENCOUNTER — Other Ambulatory Visit: Payer: Self-pay | Admitting: Internal Medicine

## 2023-04-20 ENCOUNTER — Other Ambulatory Visit: Payer: Self-pay | Admitting: Family Medicine

## 2023-05-09 ENCOUNTER — Encounter (INDEPENDENT_AMBULATORY_CARE_PROVIDER_SITE_OTHER): Payer: Self-pay

## 2023-05-27 ENCOUNTER — Telehealth: Payer: Self-pay | Admitting: Family Medicine

## 2023-05-27 DIAGNOSIS — E538 Deficiency of other specified B group vitamins: Secondary | ICD-10-CM

## 2023-05-27 DIAGNOSIS — I10 Essential (primary) hypertension: Secondary | ICD-10-CM

## 2023-05-27 DIAGNOSIS — M8000XA Age-related osteoporosis with current pathological fracture, unspecified site, initial encounter for fracture: Secondary | ICD-10-CM

## 2023-05-27 DIAGNOSIS — E7800 Pure hypercholesterolemia, unspecified: Secondary | ICD-10-CM

## 2023-05-27 DIAGNOSIS — R7303 Prediabetes: Secondary | ICD-10-CM

## 2023-05-27 NOTE — Telephone Encounter (Signed)
-----   Message from Alvina Chou sent at 05/15/2023  3:46 PM EDT ----- Regarding: Lab orders for Monday . 8.19.24 Patient is scheduled for CPX labs, please order future labs, Thanks , Camelia Eng

## 2023-05-28 ENCOUNTER — Other Ambulatory Visit (INDEPENDENT_AMBULATORY_CARE_PROVIDER_SITE_OTHER): Payer: Medicare HMO

## 2023-05-28 DIAGNOSIS — R7303 Prediabetes: Secondary | ICD-10-CM

## 2023-05-28 DIAGNOSIS — I10 Essential (primary) hypertension: Secondary | ICD-10-CM

## 2023-05-28 DIAGNOSIS — E538 Deficiency of other specified B group vitamins: Secondary | ICD-10-CM | POA: Diagnosis not present

## 2023-05-28 DIAGNOSIS — M8000XA Age-related osteoporosis with current pathological fracture, unspecified site, initial encounter for fracture: Secondary | ICD-10-CM | POA: Diagnosis not present

## 2023-05-28 DIAGNOSIS — E7800 Pure hypercholesterolemia, unspecified: Secondary | ICD-10-CM

## 2023-05-28 LAB — COMPREHENSIVE METABOLIC PANEL
ALT: 11 U/L (ref 0–35)
AST: 15 U/L (ref 0–37)
Albumin: 4.1 g/dL (ref 3.5–5.2)
Alkaline Phosphatase: 84 U/L (ref 39–117)
BUN: 18 mg/dL (ref 6–23)
CO2: 29 mEq/L (ref 19–32)
Calcium: 9.3 mg/dL (ref 8.4–10.5)
Chloride: 103 mEq/L (ref 96–112)
Creatinine, Ser: 1.29 mg/dL — ABNORMAL HIGH (ref 0.40–1.20)
GFR: 39.12 mL/min — ABNORMAL LOW (ref >60.00)
Glucose, Bld: 112 mg/dL — ABNORMAL HIGH (ref 70–99)
Potassium: 5.4 mEq/L — ABNORMAL HIGH (ref 3.5–5.1)
Sodium: 139 mEq/L (ref 135–145)
Total Bilirubin: 0.5 mg/dL (ref 0.2–1.2)
Total Protein: 6.4 g/dL (ref 6.0–8.3)

## 2023-05-28 LAB — CBC WITH DIFFERENTIAL/PLATELET
Basophils Absolute: 0 10*3/uL (ref 0.0–0.1)
Basophils Relative: 0.7 % (ref 0.0–3.0)
Eosinophils Absolute: 0.3 10*3/uL (ref 0.0–0.7)
Eosinophils Relative: 3.9 % (ref 0.0–5.0)
HCT: 38.6 % (ref 36.0–46.0)
Hemoglobin: 12.5 g/dL (ref 12.0–15.0)
Lymphocytes Relative: 20.2 % (ref 12.0–46.0)
Lymphs Abs: 1.3 10*3/uL (ref 0.7–4.0)
MCHC: 32.3 g/dL (ref 30.0–36.0)
MCV: 94.5 fl (ref 78.0–100.0)
Monocytes Absolute: 0.3 10*3/uL (ref 0.1–1.0)
Monocytes Relative: 5.1 % (ref 3.0–12.0)
Neutro Abs: 4.6 10*3/uL (ref 1.4–7.7)
Neutrophils Relative %: 70.1 % (ref 43.0–77.0)
Platelets: 281 10*3/uL (ref 150.0–400.0)
RBC: 4.09 Mil/uL (ref 3.87–5.11)
RDW: 13.2 % (ref 11.5–15.5)
WBC: 6.5 10*3/uL (ref 4.0–10.5)

## 2023-05-28 LAB — LIPID PANEL
Cholesterol: 181 mg/dL (ref 0–200)
HDL: 45.6 mg/dL (ref >39.00)
LDL Cholesterol: 107 mg/dL — ABNORMAL HIGH (ref 0–99)
NonHDL: 135.34
Total CHOL/HDL Ratio: 4
Triglycerides: 140 mg/dL (ref 0.0–149.0)
VLDL: 28 mg/dL (ref 0.0–40.0)

## 2023-05-28 LAB — VITAMIN D 25 HYDROXY (VIT D DEFICIENCY, FRACTURES): VITD: 57.13 ng/mL (ref 30.00–100.00)

## 2023-05-28 LAB — VITAMIN B12: Vitamin B-12: 828 pg/mL (ref 211–911)

## 2023-05-28 LAB — TSH: TSH: 2.33 u[IU]/mL (ref 0.35–5.50)

## 2023-05-28 LAB — HEMOGLOBIN A1C: Hgb A1c MFr Bld: 6 % (ref 4.6–6.5)

## 2023-06-04 ENCOUNTER — Ambulatory Visit (INDEPENDENT_AMBULATORY_CARE_PROVIDER_SITE_OTHER): Payer: Medicare HMO | Admitting: Family Medicine

## 2023-06-04 ENCOUNTER — Encounter: Payer: Self-pay | Admitting: Family Medicine

## 2023-06-04 VITALS — BP 134/70 | HR 77 | Temp 97.6°F | Ht 61.5 in | Wt 134.0 lb

## 2023-06-04 DIAGNOSIS — R7303 Prediabetes: Secondary | ICD-10-CM

## 2023-06-04 DIAGNOSIS — E875 Hyperkalemia: Secondary | ICD-10-CM | POA: Diagnosis not present

## 2023-06-04 DIAGNOSIS — Z79899 Other long term (current) drug therapy: Secondary | ICD-10-CM | POA: Diagnosis not present

## 2023-06-04 DIAGNOSIS — R202 Paresthesia of skin: Secondary | ICD-10-CM | POA: Diagnosis not present

## 2023-06-04 DIAGNOSIS — E7800 Pure hypercholesterolemia, unspecified: Secondary | ICD-10-CM | POA: Diagnosis not present

## 2023-06-04 DIAGNOSIS — Z23 Encounter for immunization: Secondary | ICD-10-CM | POA: Diagnosis not present

## 2023-06-04 DIAGNOSIS — Z0000 Encounter for general adult medical examination without abnormal findings: Secondary | ICD-10-CM | POA: Diagnosis not present

## 2023-06-04 DIAGNOSIS — E538 Deficiency of other specified B group vitamins: Secondary | ICD-10-CM | POA: Diagnosis not present

## 2023-06-04 DIAGNOSIS — M8000XA Age-related osteoporosis with current pathological fracture, unspecified site, initial encounter for fracture: Secondary | ICD-10-CM

## 2023-06-04 DIAGNOSIS — K259 Gastric ulcer, unspecified as acute or chronic, without hemorrhage or perforation: Secondary | ICD-10-CM | POA: Diagnosis not present

## 2023-06-04 DIAGNOSIS — R944 Abnormal results of kidney function studies: Secondary | ICD-10-CM

## 2023-06-04 DIAGNOSIS — I10 Essential (primary) hypertension: Secondary | ICD-10-CM | POA: Diagnosis not present

## 2023-06-04 MED ORDER — LISINOPRIL 5 MG PO TABS: 5.0000 mg | ORAL_TABLET | Freq: Every day | ORAL | 3 refills | Status: DC

## 2023-06-04 NOTE — Assessment & Plan Note (Signed)
GFR is down after increase lisinopril to 10 GFR of 39.1 Will cut back to 5   Encouraged fluid intake   Follow up 2 weeks for visit and will re check   Has one kidney  May require nephrology referral

## 2023-06-04 NOTE — Assessment & Plan Note (Signed)
Lab Results  Component Value Date   HGBA1C 6.0 05/28/2023   disc imp of low glycemic diet and wt loss to prevent DM2

## 2023-06-04 NOTE — Assessment & Plan Note (Signed)
Reviewed health habits including diet and exercise and skin cancer prevention Reviewed appropriate screening tests for age  Also reviewed health mt list, fam hx and immunization status , as well as social and family history   See HPI Labs reviewed and ordered Pt plans follow up to discuss worsening short term memory Flu shot given Will discuss tetanus shot with pharmacist Plans on covid booster this fall  Cologuard neg 2019 and declines further screening  Dexa 11/2022  -encouraged more strength building exercise  PHQ 2

## 2023-06-04 NOTE — Assessment & Plan Note (Addendum)
This is likely from increase of ace dose  Lab Results  Component Value Date   K 5.4 No hemolysis seen (H) 05/28/2023    Will cut lisinopril from 10 to 5 mg  Follow up 2 wk for blood pressure check and labs

## 2023-06-04 NOTE — Assessment & Plan Note (Signed)
Has seen neurology  No source found Is tolerating the symptoms

## 2023-06-04 NOTE — Progress Notes (Signed)
Subjective:    Patient ID: [name], female    DOB: 1940-11-19, 82 y.o.   MRN: [id number]  HPI  Here for health maintenance exam and to review chronic medical problems   Wt Readings from Last 3 Encounters:  06/04/23 134 lb (60.8 kg)  03/14/23 135 lb 6.4 oz (61.4 kg)  03/13/23 134 lb (60.8 kg)   24.91 kg/m  Vitals:   06/04/23 0920  BP: 134/70  Pulse: 77  Temp: 97.6 F (36.4 C)  SpO2: 96%    Immunization History  Administered Date(s) Administered   Fluad Quad(high Dose 65+) 06/28/2022   Fluad Trivalent(High Dose 65+) 06/04/2023   Influenza Split 09/20/2012   Influenza, High Dose Seasonal PF 06/26/2018, 07/09/2019   Influenza,inj,Quad PF,6+ Mos 06/11/2015   Influenza-Unspecified 07/09/2016   PFIZER(Purple Top)SARS-COV-2 Vaccination 10/31/2019, 11/22/2019, 08/09/2020   Pneumococcal Conjugate-13 06/11/2015   Pneumococcal Polysaccharide-23 11/14/2013   Td 03/31/2002   Tdap 01/27/2011   Zoster Recombinant(Shingrix) 07/23/2019, 04/21/2022   Zoster, Live 11/11/2011    Health Maintenance Due  Topic Date Due   DTaP/Tdap/Td (3 - Td or Tdap) 01/26/2021   Having some memory issues   Has been working on fundraiser /kids   Flu shot given today   Tetanus - is due for   Mammogram 06/2022 - plans to schedule  Self breast exam-no lumps   Gyn health-no problems    Colon cancer screening -colonoscopy 2015 Cologuard neg in 2019  Declines further colon cancer screening  Bone health  Dexa 11/2022  OSTEOPOROSIS worse at forearm  Cannot take bisphosphenates due to stomach ulcers  Falls-none  Fractures- last 2 y ago  Supplements  Last vitamin D Lab Results  Component Value Date   VD25OH 57.13 05/28/2023    Exercise : none  Is active    Mood    06/04/2023    9:30 AM 03/13/2023   10:07 AM 02/19/2023    8:29 AM 01/19/2023    8:29 AM 04/18/2022    9:26 AM  Depression screen PHQ 2/9  Decreased Interest 0 0 0 0 0  Down, Depressed, Hopeless 0 0 0 0 0  PHQ - 2  Score 0 0 0 0 0  Altered sleeping 1 0 1 0   Tired, decreased energy 1 0 1 1   Change in appetite 0 0 0 0   Feeling bad or failure about yourself  0 0 0 0   Trouble concentrating 0 0 0 0   Moving slowly or fidgety/restless 0 0 0 0   Suicidal thoughts 0 0 0 0   PHQ-9 Score 2 0 2 1   Difficult doing work/chores Not difficult at all Not difficult at all Not difficult at all Not difficult at all     HTN bp is stable today  No cp or palpitations or headaches or edema  No side effects to medicines  BP Readings from Last 3 Encounters:  06/04/23 134/70  03/14/23 124/80  02/19/23 131/65    Lisinopril 10 mg daily - told to cut 1/2  K level is up again   Does have frequent urination   Lab Results  Component Value Date   NA 139 05/28/2023   K 5.4 No hemolysis seen (H) 05/28/2023   CO2 29 05/28/2023   GLUCOSE 112 (H) 05/28/2023   BUN 18 05/28/2023   CREATININE 1.29 (H) 05/28/2023   CALCIUM 9.3 05/28/2023   GFR 39.12 (L) 05/28/2023   GFRNONAA 56 (L) 12/02/2020   GFR was down  from 47.8 to 39.12 as well   Takes protonix 40 mg bid for gastric ulcers    Lab Results  Component Value Date   VITAMINB12 828 05/28/2023   Last vitamin D Lab Results  Component Value Date   VD25OH 57.13 05/28/2023   Hyperlipidemia Lab Results  Component Value Date   CHOL 181 05/28/2023   CHOL 164 04/18/2022   CHOL 161 08/10/2021   Lab Results  Component Value Date   HDL 45.60 05/28/2023   HDL 44.40 04/18/2022   HDL 43.50 08/10/2021   Lab Results  Component Value Date   LDLCALC 107 (H) 05/28/2023   LDLCALC 93 04/18/2022   LDLCALC 81 08/10/2021   Lab Results  Component Value Date   TRIG 140.0 05/28/2023   TRIG 133.0 04/18/2022   TRIG 182.0 (H) 08/10/2021   Lab Results  Component Value Date   CHOLHDL 4 05/28/2023   CHOLHDL 4 04/18/2022   CHOLHDL 4 08/10/2021   No results found for: "LDLDIRECT" Atorvastatin 10 mg daily  LDL is up a bit  Eats fatty food occational    Prediabetes Lab Results  Component Value Date   HGBA1C 6.0 05/28/2023  Stable   Lab Results  Component Value Date   WBC 6.5 05/28/2023   HGB 12.5 05/28/2023   HCT 38.6 05/28/2023   MCV 94.5 05/28/2023   PLT 281.0 05/28/2023   Lab Results  Component Value Date   TSH 2.33 05/28/2023   Lab Results  Component Value Date   ALT 11 05/28/2023   AST 15 05/28/2023   ALKPHOS 84 05/28/2023   BILITOT 0.5 05/28/2023       Patient Active Problem List   Diagnosis Date Noted   Facial paresthesia 01/19/2023   Right groin pain 10/10/2022   Current use of proton pump inhibitor 04/18/2022   Decreased GFR 04/18/2022   Vitamin B12 deficiency 04/18/2022   Multiple gastric ulcers 04/27/2021   Effusion of right knee joint 05/31/2020   Pain in right knee 05/31/2020   Pain in joint of left shoulder 05/20/2020   Pain in right foot 04/08/2020   Closed fracture of shaft of right fibula 01/08/2020   Pain in joint of right ankle 01/08/2020   Hematoma of left breast 01/02/2020   Carpal tunnel syndrome 05/27/2018   Pain in joint of right foot 04/24/2018   Hyperkalemia 02/01/2018   Encounter for screening mammogram for breast cancer 01/19/2017   Estrogen deficiency 01/19/2017   Routine general medical examination at a health care facility 06/11/2015   Family history of colon cancer 11/14/2013   Colon cancer screening 11/14/2013   Hypertension 01/27/2011   Osteoporosis 01/27/2011   Low back pain radiating to left leg 03/13/2008   Hyperlipidemia 12/20/2007   CONSTIPATION, CHRONIC 12/20/2007   Prediabetes 12/20/2007   Past Medical History:  Diagnosis Date   Back pain    Chronic constipation    past hx- no current constipation 02-2021   Hyperlipidemia    Hypertension    Multiple gastric ulcers    Osteopenia    Single kidney    donated kidney    Past Surgical History:  Procedure Laterality Date   CLEFT PALATE REPAIR     COLONOSCOPY     FOOT NEUROMA SURGERY  2/06   NEPHRECTOMY  LIVING DONOR     Left   TUBAL LIGATION     UPPER GASTROINTESTINAL ENDOSCOPY     Social History   Tobacco Use   Smoking status: Never   Smokeless tobacco:  Never  Vaping Use   Vaping status: Never Used  Substance Use Topics   Alcohol use: No   Drug use: No   Family History  Problem Relation Age of Onset   Colon cancer Mother    Diabetes Mother    Coronary artery disease Mother    Osteoporosis Mother    Dementia Mother    Coronary artery disease Father    Heart failure Father    Hypertension Father    Stomach cancer Neg Hx    Pancreatic cancer Neg Hx    Esophageal cancer Neg Hx    Liver disease Neg Hx    Colon polyps Neg Hx    Rectal cancer Neg Hx    Breast cancer Neg Hx    Allergies  Allergen Reactions   Codeine    Hydrocodone Bit-Homatrop Mbr     Caused flu-like symptoms   Current Outpatient Medications on File Prior to Visit  Medication Sig Dispense Refill   atorvastatin (LIPITOR) 10 MG tablet TAKE 1 TABLET BY MOUTH EVERY DAY 90 tablet 0   Cyanocobalamin (B-12) 1000 MCG CAPS Take 1 capsule by mouth daily.     D 1000 25 MCG (1000 UT) capsule SMARTSIG:1 By Mouth     pantoprazole (PROTONIX) 40 MG tablet Take 1 tablet (40 mg total) by mouth 2 (two) times daily. Office visit for further refills 180 tablet 0   No current facility-administered medications on file prior to visit.    Review of Systems  Constitutional:  Negative for activity change, appetite change, fatigue, fever and unexpected weight change.  HENT:  Negative for congestion, ear pain, rhinorrhea, sinus pressure and sore throat.   Eyes:  Negative for pain, redness and visual disturbance.  Respiratory:  Negative for cough, shortness of breath and wheezing.   Cardiovascular:  Negative for chest pain and palpitations.  Gastrointestinal:  Negative for abdominal pain, blood in stool, constipation and diarrhea.  Endocrine: Negative for polydipsia and polyuria.  Genitourinary:  Negative for dysuria, frequency  and urgency.  Musculoskeletal:  Positive for arthralgias. Negative for back pain and myalgias.  Skin:  Negative for pallor and rash.  Allergic/Immunologic: Negative for environmental allergies.  Neurological:  Negative for dizziness, syncope and headaches.  Hematological:  Negative for adenopathy. Does not bruise/bleed easily.  Psychiatric/Behavioral:  Negative for decreased concentration and dysphoric mood. The patient is not nervous/anxious.        Objective:   Physical Exam Constitutional:      General: She is not in acute distress.    Appearance: Normal appearance. She is well-developed and normal weight. She is not ill-appearing or diaphoretic.  HENT:     Head: Normocephalic and atraumatic.     Right Ear: Tympanic membrane, ear canal and external ear normal.     Left Ear: Tympanic membrane, ear canal and external ear normal.     Nose: Nose normal. No congestion.     Mouth/Throat:     Mouth: Mucous membranes are moist.     Pharynx: Oropharynx is clear. No posterior oropharyngeal erythema.  Eyes:     General: No scleral icterus.    Extraocular Movements: Extraocular movements intact.     Conjunctiva/sclera: Conjunctivae normal.     Pupils: Pupils are equal, round, and reactive to light.  Neck:     Thyroid: No thyromegaly.     Vascular: No carotid bruit or JVD.  Cardiovascular:     Rate and Rhythm: Normal rate and regular rhythm.     Pulses: Normal  pulses.     Heart sounds: Normal heart sounds.     No gallop.  Pulmonary:     Effort: Pulmonary effort is normal. No respiratory distress.     Breath sounds: Normal breath sounds. No wheezing.     Comments: Good air exch Chest:     Chest wall: No tenderness.  Abdominal:     General: Bowel sounds are normal. There is no distension or abdominal bruit.     Palpations: Abdomen is soft. There is no mass.     Tenderness: There is no abdominal tenderness.     Hernia: No hernia is present.  Genitourinary:    Comments: Breast exam:  No mass, nodules, thickening, tenderness, bulging, retraction, inflamation, nipple discharge or skin changes noted.  No axillary or clavicular LA.     Musculoskeletal:        General: No tenderness. Normal range of motion.     Cervical back: Normal range of motion and neck supple. No rigidity. No muscular tenderness.     Right lower leg: No edema.     Left lower leg: No edema.     Comments: No kyphosis   Lymphadenopathy:     Cervical: No cervical adenopathy.  Skin:    General: Skin is warm and dry.     Coloration: Skin is not pale.     Findings: No erythema or rash.     Comments: Solar lentigines diffusely Some scattered SKs Solar changes   Neurological:     Mental Status: She is alert. Mental status is at baseline.     Cranial Nerves: No cranial nerve deficit.     Motor: No abnormal muscle tone.     Coordination: Coordination normal.     Gait: Gait normal.     Deep Tendon Reflexes: Reflexes are normal and symmetric. Reflexes normal.  Psychiatric:        Mood and Affect: Mood normal.        Cognition and Memory: Cognition and memory normal.           Assessment & Plan:   Problem List Items Addressed This Visit       Cardiovascular and Mediastinum   Hypertension    Blood pressure is well controlled on lisinopril 10 mg daily but K level is up   BP: 134/70  Will cut back to 5 mg daily  Start checking blood pressure at home (given handout re: how) Follow up in 2 wk with cuff and log and will re check lab at that time  Encouraged good health habits       Relevant Medications   lisinopril (ZESTRIL) 5 MG tablet     Digestive   Multiple gastric ulcers    On generic protonix 40 mg bid Renal #s are up  Unsure if she needs to continue treatment this aggressively  She will plan follow up with GI to discuss         Musculoskeletal and Integument   Osteoporosis    Pt is unable to take alendronate due to GI issues Fracture risk is high  Taking vit D Discussed adding  strength building exercise   Given info on prolia to consider and check coverage  Will depend on renal fxn and ins coverage Will discuss at her 2 wk f/u        Other   Current use of proton pump inhibitor    Hopefully - will follow up with GI and discuss cutting it back  Lab Results  Component Value Date   VITAMINB12 828 05/28/2023   Last vitamin D Lab Results  Component Value Date   VD25OH 57.13 05/28/2023         Decreased GFR    GFR is down after increase lisinopril to 10 GFR of 39.1 Will cut back to 5   Encouraged fluid intake   Follow up 2 weeks for visit and will re check   Has one kidney  May require nephrology referral       Facial paresthesia    Has seen neurology  No source found Is tolerating the symptoms       Hyperkalemia    This is likely from increase of ace dose  Lab Results  Component Value Date   K 5.4 No hemolysis seen (H) 05/28/2023    Will cut lisinopril from 10 to 5 mg  Follow up 2 wk for blood pressure check and labs       Hyperlipidemia    Lipids are up a bit  LDL of 107  Disc goals for lipids and reasons to control them Rev last labs with pt Rev low sat fat diet in detail Will continue atorvastatin 10 mg daily  Consider increase dose if no improvement        Relevant Medications   lisinopril (ZESTRIL) 5 MG tablet   Prediabetes    Lab Results  Component Value Date   HGBA1C 6.0 05/28/2023   disc imp of low glycemic diet and wt loss to prevent DM2       Routine general medical examination at a health care facility - Primary    Reviewed health habits including diet and exercise and skin cancer prevention Reviewed appropriate screening tests for age  Also reviewed health mt list, fam hx and immunization status , as well as social and family history   See HPI Labs reviewed and ordered Pt plans follow up to discuss worsening short term memory Flu shot given Will discuss tetanus shot with pharmacist Plans on covid  booster this fall  Cologuard neg 2019 and declines further screening  Dexa 11/2022  -encouraged more strength building exercise  PHQ 2        Vitamin B12 deficiency    Lab Results  Component Value Date   VITAMINB12 828 05/28/2023   Continue oral supplementation  On high dose ppi      Other Visit Diagnoses     Need for influenza vaccination       Relevant Orders   Flu Vaccine Trivalent High Dose (Fluad) (Completed)

## 2023-06-04 NOTE — Assessment & Plan Note (Signed)
Lipids are up a bit  LDL of 107  Disc goals for lipids and reasons to control them Rev last labs with pt Rev low sat fat diet in detail Will continue atorvastatin 10 mg daily  Consider increase dose if no improvement

## 2023-06-04 NOTE — Assessment & Plan Note (Signed)
On generic protonix 40 mg bid Renal #s are up  Unsure if she needs to continue treatment this aggressively  She will plan follow up with GI to discuss

## 2023-06-04 NOTE — Assessment & Plan Note (Signed)
Hopefully - will follow up with GI and discuss cutting it back  Lab Results  Component Value Date   VITAMINB12 828 05/28/2023   Last vitamin D Lab Results  Component Value Date   VD25OH 57.13 05/28/2023

## 2023-06-04 NOTE — Assessment & Plan Note (Signed)
Pt is unable to take alendronate due to GI issues Fracture risk is high  Taking vit D Discussed adding strength building exercise   Given info on prolia to consider and check coverage  Will depend on renal fxn and ins coverage Will discuss at her 2 wk f/u

## 2023-06-04 NOTE — Assessment & Plan Note (Signed)
Lab Results  Component Value Date   VITAMINB12 828 05/28/2023   Continue oral supplementation  On high dose ppi

## 2023-06-04 NOTE — Assessment & Plan Note (Signed)
Blood pressure is well controlled on lisinopril 10 mg daily but K level is up   BP: 134/70  Will cut back to 5 mg daily  Start checking blood pressure at home (given handout re: how) Follow up in 2 wk with cuff and log and will re check lab at that time  Encouraged good health habits

## 2023-06-04 NOTE — Patient Instructions (Addendum)
The next time you are at the pharmacy -inquire about a tetanus shot  Get a covid booster in the fall when the new one comes out   Read about the generic prolia and check with insurance regarding coverage  Let us know what you think and what you find out   Please aim for exercise 5 days per week at least 30 minutes   Walking is great  Add some strength training to your routine, this is important for bone and brain health and can reduce your risk of falls and help your body use insulin properly and regulate weight  Light weights, exercise bands , and internet videos are a good way to start  Yoga (chair or regular), machines , floor exercises or a gym with machines are also good options    Your potassium is high and kidney numbers are up  Cut your lisinopril in 1/2 and take a half pill daily - 5 mg daily instead of 10  The new prescription will be for 5 mg   Get a home blood pressure cuff  Check blood pressure when relaxed with with arm resting at heart level   The generic protonix is hard on kidneys long term Make an appt with Dr Marina Goodell when you can to discuss this    Follow up in 2 weeks for blood pressure Bring your cuff Do some readings at home- keep a log and bring it with you   We will first work on blood pressure and kidney function Then discuss memory      Call and schedule your mammogram, you are due next months   []   2D Mammogram  [x]   3D Mammogram  []   Bone Density     Please call for appointment:   []   [facility]  [address] [place] [address]  [phone]  []   [facility]   [address]  [place], [place] [address]  [phone]  [x]   [facility]      [address] [place], [place]        [phone]         []   [facility]  [address] [place],  [place]  [phone]  []  [facility]   [address]   [place], [place] [address]  [phone]  []  [facility]  [address] [place], [place] [address] [phone]    Make sure to wear two piece clothing  No lotions powders or deodorants the day of the appointment Make sure to bring picture ID and insurance card.  Bring list of medications you are currently taking including any supplements.   Schedule your screening mammogram through MyChart!   Select [place] imaging sites can now be scheduled through MyChart.  Log into your MyChart account.  Go to 'Visit' (or 'Appointments' if  on mobile App) --> Schedule an  Appointment  Under 'Select a Reason for Visit' choose the Mammogram  Screening option.  Complete the pre-visit questions  and select the time and place that  best fits your schedule

## 2023-06-19 ENCOUNTER — Encounter: Payer: Self-pay | Admitting: Family Medicine

## 2023-06-19 ENCOUNTER — Ambulatory Visit (INDEPENDENT_AMBULATORY_CARE_PROVIDER_SITE_OTHER): Payer: Medicare HMO | Admitting: Family Medicine

## 2023-06-19 VITALS — BP 122/70 | HR 73 | Temp 97.8°F | Ht 61.5 in | Wt 132.4 lb

## 2023-06-19 DIAGNOSIS — I10 Essential (primary) hypertension: Secondary | ICD-10-CM | POA: Diagnosis not present

## 2023-06-19 LAB — BASIC METABOLIC PANEL
BUN: 14 mg/dL (ref 6–23)
CO2: 30 meq/L (ref 19–32)
Calcium: 9.3 mg/dL (ref 8.4–10.5)
Chloride: 101 meq/L (ref 96–112)
Creatinine, Ser: 1.08 mg/dL (ref 0.40–1.20)
GFR: 48.39 mL/min — ABNORMAL LOW (ref >60.00)
Glucose, Bld: 91 mg/dL (ref 70–99)
Potassium: 4.6 meq/L (ref 3.5–5.1)
Sodium: 137 meq/L (ref 135–145)

## 2023-06-19 NOTE — Progress Notes (Signed)
Subjective:    Patient ID: [name], female    DOB: 1941/04/03, 82 y.o.   MRN: [id number]  HPI  Wt Readings from Last 3 Encounters:  06/19/23 132 lb 6 oz (60 kg)  06/04/23 134 lb (60.8 kg)  03/14/23 135 lb 6.4 oz (61.4 kg)   24.61 kg/m  Vitals:   06/19/23 1015 06/19/23 1037  BP: 130/70 122/70  Pulse: 73   Temp: 97.8 F (36.6 C)   SpO2: 96%    Pt presents for follow up of HTN  bp is stable today  No cp or palpitations or headaches or edema  No side effects to medicines  BP Readings from Last 3 Encounters:  06/19/23 122/70  06/04/23 134/70  03/14/23 124/80    Last visit we cut back lisinopril to 5 mg (due to elevated K)   Her blood pressure at home 120s to 130s / 60s-70s   Feeling good   Trying to walk more  Exercising - and doing yard work  Thinking about [facility]   Classes in the past     Lab Results  Component Value Date   NA 139 05/28/2023   K 5.4 No hemolysis seen (H) 05/28/2023   CO2 29 05/28/2023   GLUCOSE 112 (H) 05/28/2023   BUN 18 05/28/2023   CREATININE 1.29 (H) 05/28/2023   CALCIUM 9.3 05/28/2023   GFR 39.12 (L) 05/28/2023   GFRNONAA 56 (L) 12/02/2020     Patient Active Problem List   Diagnosis Date Noted   Facial paresthesia 01/19/2023   Right groin pain 10/10/2022   Current use of proton pump inhibitor 04/18/2022   Decreased GFR 04/18/2022   Vitamin B12 deficiency 04/18/2022   Multiple gastric ulcers 04/27/2021   Effusion of right knee joint 05/31/2020   Pain in right knee 05/31/2020   Pain in joint of left shoulder 05/20/2020   Pain in right foot 04/08/2020   Closed fracture of shaft of right fibula 01/08/2020   Pain in joint of right ankle 01/08/2020   Hematoma of left breast 01/02/2020   Carpal tunnel syndrome 05/27/2018   Pain in joint of right foot 04/24/2018   Hyperkalemia 02/01/2018   Encounter for screening mammogram for breast cancer 01/19/2017   Estrogen deficiency 01/19/2017   Routine general medical  examination at a health care facility 06/11/2015   Family history of colon cancer 11/14/2013   Colon cancer screening 11/14/2013   Hypertension 01/27/2011   Osteoporosis 01/27/2011   Low back pain radiating to left leg 03/13/2008   Hyperlipidemia 12/20/2007   CONSTIPATION, CHRONIC 12/20/2007   Prediabetes 12/20/2007   Past Medical History:  Diagnosis Date   Back pain    Chronic constipation    past hx- no current constipation 02-2021   Hyperlipidemia    Hypertension    Multiple gastric ulcers    Osteopenia    Single kidney    donated kidney    Past Surgical History:  Procedure Laterality Date   CLEFT PALATE REPAIR     COLONOSCOPY     FOOT NEUROMA SURGERY  2/06   NEPHRECTOMY LIVING DONOR     Left   TUBAL LIGATION     UPPER GASTROINTESTINAL ENDOSCOPY     Social History   Tobacco Use   Smoking status: Never   Smokeless tobacco: Never  Vaping Use   Vaping status: Never Used  Substance Use Topics   Alcohol use: No   Drug use: No   Family History  Problem Relation  Age of Onset   Colon cancer Mother    Diabetes Mother    Coronary artery disease Mother    Osteoporosis Mother    Dementia Mother    Coronary artery disease Father    Heart failure Father    Hypertension Father    Stomach cancer Neg Hx    Pancreatic cancer Neg Hx    Esophageal cancer Neg Hx    Liver disease Neg Hx    Colon polyps Neg Hx    Rectal cancer Neg Hx    Breast cancer Neg Hx    Allergies  Allergen Reactions   Codeine    Hydrocodone Bit-Homatrop Mbr     Caused flu-like symptoms   Current Outpatient Medications on File Prior to Visit  Medication Sig Dispense Refill   atorvastatin (LIPITOR) 10 MG tablet TAKE 1 TABLET BY MOUTH EVERY DAY 90 tablet 0   Cyanocobalamin (B-12) 1000 MCG CAPS Take 1 capsule by mouth daily.     D 1000 25 MCG (1000 UT) capsule SMARTSIG:1 By Mouth     lisinopril (ZESTRIL) 5 MG tablet Take 1 tablet (5 mg total) by mouth daily. 90 tablet 3   pantoprazole (PROTONIX)  40 MG tablet Take 1 tablet (40 mg total) by mouth 2 (two) times daily. Office visit for further refills 180 tablet 0   No current facility-administered medications on file prior to visit.    Review of Systems  Constitutional:  Negative for activity change, appetite change, fatigue, fever and unexpected weight change.  HENT:  Negative for congestion, ear pain, rhinorrhea, sinus pressure and sore throat.   Eyes:  Negative for pain, redness and visual disturbance.  Respiratory:  Negative for cough, shortness of breath and wheezing.   Cardiovascular:  Negative for chest pain and palpitations.  Gastrointestinal:  Negative for abdominal pain, blood in stool, constipation and diarrhea.  Endocrine: Negative for polydipsia and polyuria.  Genitourinary:  Negative for dysuria, frequency and urgency.  Musculoskeletal:  Negative for arthralgias, back pain and myalgias.  Skin:  Negative for pallor and rash.  Allergic/Immunologic: Negative for environmental allergies.  Neurological:  Negative for dizziness, syncope and headaches.  Hematological:  Negative for adenopathy. Does not bruise/bleed easily.  Psychiatric/Behavioral:  Negative for decreased concentration and dysphoric mood. The patient is not nervous/anxious.        Objective:   Physical Exam Constitutional:      General: She is not in acute distress.    Appearance: Normal appearance. She is well-developed and normal weight. She is not ill-appearing or diaphoretic.  HENT:     Head: Normocephalic and atraumatic.  Eyes:     Conjunctiva/sclera: Conjunctivae normal.     Pupils: Pupils are equal, round, and reactive to light.  Neck:     Thyroid: No thyromegaly.     Vascular: No carotid bruit or JVD.  Cardiovascular:     Rate and Rhythm: Normal rate and regular rhythm.     Heart sounds: Normal heart sounds.     No gallop.  Pulmonary:     Effort: Pulmonary effort is normal. No respiratory distress.     Breath sounds: Normal breath sounds.  No wheezing or rales.  Abdominal:     General: There is no distension or abdominal bruit.     Palpations: Abdomen is soft.  Musculoskeletal:     Cervical back: Normal range of motion and neck supple.     Right lower leg: No edema.     Left lower leg: No edema.  Lymphadenopathy:     Cervical: No cervical adenopathy.  Skin:    General: Skin is warm and dry.     Coloration: Skin is not pale.     Findings: No rash.  Neurological:     Mental Status: She is alert.     Coordination: Coordination normal.     Deep Tendon Reflexes: Reflexes are normal and symmetric. Reflexes normal.  Psychiatric:        Mood and Affect: Mood normal.           Assessment & Plan:   Problem List Items Addressed This Visit       Cardiovascular and Mediastinum   Hypertension - Primary    BP: 122/70  Good control here and at home with 5 mg lisinopril (had elevated K on 10 mg) Will re check bmet today  If K is normal- plan to continue this dose  Commended on lifestyle changes /healthy habits as well  Encouraged her to keep exercising       Relevant Orders   Basic metabolic panel

## 2023-06-19 NOTE — Patient Instructions (Addendum)
Keep walking Continue yard work   Add some Runner, broadcasting/film/video to your routine, this is important for bone and brain health and can reduce your risk of falls and help your body use insulin properly and regulate weight  Light weights, exercise bands , and internet videos are a good way to start  Yoga (chair or regular), machines , floor exercises or a gym with machines are also good options  [facility] - think about silver sneakers program again Do something you enjoy that helps build muscle   Labs today  If potassium level is ok then we will continue current medicines  Take care of yourself  Blood pressure is great!

## 2023-06-19 NOTE — Assessment & Plan Note (Signed)
BP: 122/70  Good control here and at home with 5 mg lisinopril (had elevated K on 10 mg) Will re check bmet today  If K is normal- plan to continue this dose  Commended on lifestyle changes /healthy habits as well  Encouraged her to keep exercising

## 2023-07-13 ENCOUNTER — Other Ambulatory Visit: Payer: Self-pay | Admitting: Family Medicine

## 2023-07-16 DIAGNOSIS — M25512 Pain in left shoulder: Secondary | ICD-10-CM | POA: Diagnosis not present

## 2023-07-17 DIAGNOSIS — M25512 Pain in left shoulder: Secondary | ICD-10-CM | POA: Diagnosis not present

## 2023-07-18 DIAGNOSIS — M75121 Complete rotator cuff tear or rupture of right shoulder, not specified as traumatic: Secondary | ICD-10-CM | POA: Diagnosis not present

## 2023-07-18 DIAGNOSIS — M25512 Pain in left shoulder: Secondary | ICD-10-CM | POA: Diagnosis not present

## 2023-08-02 DIAGNOSIS — M19012 Primary osteoarthritis, left shoulder: Secondary | ICD-10-CM | POA: Diagnosis not present

## 2023-08-02 DIAGNOSIS — M75121 Complete rotator cuff tear or rupture of right shoulder, not specified as traumatic: Secondary | ICD-10-CM | POA: Diagnosis not present

## 2023-08-06 DIAGNOSIS — M25512 Pain in left shoulder: Secondary | ICD-10-CM | POA: Diagnosis not present

## 2023-08-07 ENCOUNTER — Ambulatory Visit (INDEPENDENT_AMBULATORY_CARE_PROVIDER_SITE_OTHER): Payer: Medicare HMO | Admitting: Family Medicine

## 2023-08-07 ENCOUNTER — Encounter: Payer: Self-pay | Admitting: Family Medicine

## 2023-08-07 VITALS — BP 122/64 | HR 89 | Temp 99.0°F | Ht 61.5 in | Wt 132.4 lb

## 2023-08-07 DIAGNOSIS — R202 Paresthesia of skin: Secondary | ICD-10-CM | POA: Diagnosis not present

## 2023-08-07 DIAGNOSIS — R519 Headache, unspecified: Secondary | ICD-10-CM | POA: Diagnosis not present

## 2023-08-07 NOTE — Progress Notes (Signed)
Subjective:    Patient ID: [name], female    DOB: February 02, 1941, 82 y.o.   MRN: [id number]  HPI  Wt Readings from Last 3 Encounters:  08/07/23 132 lb 6 oz (60 kg)  06/19/23 132 lb 6 oz (60 kg)  06/04/23 134 lb (60.8 kg)   24.61 kg/m  Vitals:   08/07/23 1417  BP: 122/64  Pulse: 89  Temp: 99 F (37.2 C)  SpO2: 99%    Pt presents for c/o right ear and facial pain   Has chronic paresthesia in right face  Now new symptoms   Like a lightning bolt shooting through the right temple Fleeting and very painful  Relieved by pressing on it   Ear feels odd also  Feels like there is too much air in ear- like a hole in her ear drum  No pressure or pain  No jaw pain  No vision change at all   Hearing has not changed   99 temp- thinks warm from car coming in   Some allergy symptoms  Has had a cold possibly  Runny nose-not much   Lab Results  Component Value Date   WBC 6.5 05/28/2023   HGB 12.5 05/28/2023   HCT 38.6 05/28/2023   MCV 94.5 05/28/2023   PLT 281.0 05/28/2023   Lab Results  Component Value Date   ESRSEDRATE 4 01/19/2023    Pt does have history of facial paresthesia  Has seen neuro in the past  Did not find cause  MRI and MRI of head/brain were done   Noted small mucous retention cyst in right max sinus and small vulume of fluid in mastoid air cells bilat   They discussed trigeminal neuralgia but lack of pain at the time seemed to not fit that  Cervical radiculopathy was another consideration   Was encouraged to do some home neck exercises  Decided on cons management   Would consider MRI trigeminal protocol if symptoms worsen   Per note PLAN: -Discussed PT for neck tightness but patient would prefer to do home exercises found online. -Patient would prefer conservative management prior to considering medication, which is very reasonable in this case. -Patient will call if symptoms worsen or do not improve -If symptoms begin to appear more  consistent with trigeminal neuralgia, could consider MRI trigeminal protocol.   -Return to clinic as needed  Patient Active Problem List   Diagnosis Date Noted   Right facial pain 08/07/2023   Facial paresthesia 01/19/2023   Right groin pain 10/10/2022   Current use of proton pump inhibitor 04/18/2022   Decreased GFR 04/18/2022   Vitamin B12 deficiency 04/18/2022   Multiple gastric ulcers 04/27/2021   Effusion of right knee joint 05/31/2020   Pain in right knee 05/31/2020   Pain in joint of left shoulder 05/20/2020   Pain in right foot 04/08/2020   Closed fracture of shaft of right fibula 01/08/2020   Pain in joint of right ankle 01/08/2020   Hematoma of left breast 01/02/2020   Carpal tunnel syndrome 05/27/2018   Pain in joint of right foot 04/24/2018   Hyperkalemia 02/01/2018   Encounter for screening mammogram for breast cancer 01/19/2017   Estrogen deficiency 01/19/2017   Routine general medical examination at a health care facility 06/11/2015   Family history of colon cancer 11/14/2013   Colon cancer screening 11/14/2013   Hypertension 01/27/2011   Osteoporosis 01/27/2011   Low back pain radiating to left leg 03/13/2008   Hyperlipidemia 12/20/2007  CONSTIPATION, CHRONIC 12/20/2007   Prediabetes 12/20/2007   Past Medical History:  Diagnosis Date   Back pain    Chronic constipation    past hx- no current constipation 02-2021   Hyperlipidemia    Hypertension    Multiple gastric ulcers    Osteopenia    Single kidney    donated kidney    Past Surgical History:  Procedure Laterality Date   CLEFT PALATE REPAIR     COLONOSCOPY     FOOT NEUROMA SURGERY  2/06   NEPHRECTOMY LIVING DONOR     Left   TUBAL LIGATION     UPPER GASTROINTESTINAL ENDOSCOPY     Social History   Tobacco Use   Smoking status: Never   Smokeless tobacco: Never  Vaping Use   Vaping status: Never Used  Substance Use Topics   Alcohol use: No   Drug use: No   Family History  Problem  Relation Age of Onset   Colon cancer Mother    Diabetes Mother    Coronary artery disease Mother    Osteoporosis Mother    Dementia Mother    Coronary artery disease Father    Heart failure Father    Hypertension Father    Stomach cancer Neg Hx    Pancreatic cancer Neg Hx    Esophageal cancer Neg Hx    Liver disease Neg Hx    Colon polyps Neg Hx    Rectal cancer Neg Hx    Breast cancer Neg Hx    Allergies  Allergen Reactions   Codeine    Hydrocodone Bit-Homatrop Mbr     Caused flu-like symptoms   Current Outpatient Medications on File Prior to Visit  Medication Sig Dispense Refill   atorvastatin (LIPITOR) 10 MG tablet TAKE 1 TABLET BY MOUTH EVERY DAY 90 tablet 1   Cyanocobalamin (B-12) 1000 MCG CAPS Take 1 capsule by mouth daily.     D 1000 25 MCG (1000 UT) capsule SMARTSIG:1 By Mouth     lisinopril (ZESTRIL) 10 MG tablet Take 1 tablet by mouth daily.     pantoprazole (PROTONIX) 40 MG tablet Take 1 tablet (40 mg total) by mouth 2 (two) times daily. Office visit for further refills 180 tablet 0   No current facility-administered medications on file prior to visit.    Review of Systems     Objective:   Physical Exam Constitutional:      General: She is not in acute distress.    Appearance: She is well-developed.  HENT:     Head: Normocephalic and atraumatic.     Comments: No facial swelling No temple or TMJ tenderness  No crepitus of TMJ  Was not able to illicit pain with exam   Baseline surgical facial changes      Right Ear: External ear normal.     Left Ear: External ear normal.     Nose: Nose normal.     Mouth/Throat:     Pharynx: No oropharyngeal exudate.  Eyes:     General: No scleral icterus.       Right eye: No discharge.        Left eye: No discharge.     Conjunctiva/sclera: Conjunctivae normal.     Pupils: Pupils are equal, round, and reactive to light.     Comments: No nystagmus  Neck:     Thyroid: No thyromegaly.     Vascular: No carotid  bruit or JVD.     Trachea: No tracheal deviation.  Cardiovascular:  Rate and Rhythm: Normal rate and regular rhythm.     Heart sounds: Normal heart sounds. No murmur heard. Pulmonary:     Effort: Pulmonary effort is normal. No respiratory distress.     Breath sounds: Normal breath sounds. No wheezing or rales.  Abdominal:     General: Bowel sounds are normal. There is no distension.     Palpations: Abdomen is soft. There is no mass.     Tenderness: There is no abdominal tenderness.  Musculoskeletal:        General: No tenderness.     Cervical back: Full passive range of motion without pain, normal range of motion and neck supple.  Lymphadenopathy:     Cervical: No cervical adenopathy.  Skin:    General: Skin is warm and dry.     Coloration: Skin is not pale.     Findings: No rash.  Neurological:     Mental Status: She is alert and oriented to person, place, and time.     Cranial Nerves: No cranial nerve deficit, dysarthria or facial asymmetry.     Sensory: Sensory deficit present.     Motor: No weakness, tremor, atrophy, abnormal muscle tone, seizure activity or pronator drift.     Coordination: Coordination is intact. Romberg sign negative. Coordination normal.     Gait: Gait normal.     Deep Tendon Reflexes: Reflexes are normal and symmetric. Reflexes normal.     Comments: No focal cerebellar signs   Baseline hyper / altered sensation to touch over R face   Psychiatric:        Behavior: Behavior normal.        Thought Content: Thought content normal.           Assessment & Plan:   Problem List Items Addressed This Visit       Other   Facial paresthesia    This continues  Records/imaging/neuro notes /labs reviewed in detail  ? If related to new fleeting facial pain   TA and trigeminal neuralgia in differential       Right facial pain - Primary    In pt with past history of chronic right facial paresthesia  Now fleeting random sharp pains in right temple  and face that come and go  Exam is reassuring  Reviewed last neuro note from June , MRI, MRA and labs   Today  Will check esr and crp to r/o temporal arteritis  If neg- consider MRI trigeminal protocol and/or neuro follow up with Dr Loleta Chance   Call back and Er precautions noted in detail today          Relevant Orders   CBC with Differential/Platelet   Sedimentation Rate   C-reactive protein

## 2023-08-07 NOTE — Patient Instructions (Signed)
I want to first rule out temporal arteritis with labs If that is negative we may need to rule out trigeminal neuralgia (plan of the neurologist)   Labs today   Will make a plan from there

## 2023-08-07 NOTE — Assessment & Plan Note (Signed)
In pt with past history of chronic right facial paresthesia  Now fleeting random sharp pains in right temple and face that come and go  Exam is reassuring  Reviewed last neuro note from June , MRI, MRA and labs   Today  Will check esr and crp to r/o temporal arteritis  If neg- consider MRI trigeminal protocol and/or neuro follow up with Dr Loleta Chance   Call back and Er precautions noted in detail today

## 2023-08-07 NOTE — Assessment & Plan Note (Signed)
This continues  Records/imaging/neuro notes /labs reviewed in detail  ? If related to new fleeting facial pain   TA and trigeminal neuralgia in differential

## 2023-08-08 ENCOUNTER — Telehealth: Payer: Self-pay | Admitting: Family Medicine

## 2023-08-08 DIAGNOSIS — R519 Headache, unspecified: Secondary | ICD-10-CM

## 2023-08-08 DIAGNOSIS — R202 Paresthesia of skin: Secondary | ICD-10-CM

## 2023-08-08 LAB — CBC WITH DIFFERENTIAL/PLATELET
Basophils Absolute: 0.1 10*3/uL (ref 0.0–0.1)
Basophils Relative: 0.9 % (ref 0.0–3.0)
Eosinophils Absolute: 0.2 10*3/uL (ref 0.0–0.7)
Eosinophils Relative: 3 % (ref 0.0–5.0)
HCT: 38.9 % (ref 36.0–46.0)
Hemoglobin: 12.9 g/dL (ref 12.0–15.0)
Lymphocytes Relative: 22.7 % (ref 12.0–46.0)
Lymphs Abs: 1.6 10*3/uL (ref 0.7–4.0)
MCHC: 33.2 g/dL (ref 30.0–36.0)
MCV: 92.8 fL (ref 78.0–100.0)
Monocytes Absolute: 0.4 10*3/uL (ref 0.1–1.0)
Monocytes Relative: 5.4 % (ref 3.0–12.0)
Neutro Abs: 4.6 10*3/uL (ref 1.4–7.7)
Neutrophils Relative %: 68 % (ref 43.0–77.0)
Platelets: 274 10*3/uL (ref 150.0–400.0)
RBC: 4.19 Mil/uL (ref 3.87–5.11)
RDW: 12.7 % (ref 11.5–15.5)
WBC: 6.8 10*3/uL (ref 4.0–10.5)

## 2023-08-08 LAB — SEDIMENTATION RATE: Sed Rate: 12 mm/h (ref 0–30)

## 2023-08-08 LAB — C-REACTIVE PROTEIN: CRP: <1 mg/dL (ref 0.5–20.0)

## 2023-08-08 NOTE — Telephone Encounter (Signed)
Labs are reassuring - unlikely temporal arteritis  I think she more likely has trigeminal neuralgia  I would like to get her back to neurology and consider a trigeminal nerve protocol mri  Is that ok ?   I gave her a handout on the condition when she was here

## 2023-08-10 NOTE — Telephone Encounter (Signed)
Spoke with patient and advised of notes. She is in agreement with referral to neurology/MRI

## 2023-08-12 NOTE — Telephone Encounter (Signed)
I put the referral in  Please let us know if you don't hear in 1-2 weeks to set this up   Will cc dr Loleta Chance (neuro) When do you want to see her back?   Facial paresthesia has turned into pain    Thanks

## 2023-08-13 ENCOUNTER — Encounter: Payer: Self-pay | Admitting: Family Medicine

## 2023-08-13 ENCOUNTER — Ambulatory Visit (INDEPENDENT_AMBULATORY_CARE_PROVIDER_SITE_OTHER): Payer: Medicare HMO | Admitting: Family Medicine

## 2023-08-13 VITALS — BP 106/64 | HR 82 | Temp 98.5°F | Ht 61.5 in | Wt 132.1 lb

## 2023-08-13 DIAGNOSIS — Z01818 Encounter for other preprocedural examination: Secondary | ICD-10-CM | POA: Diagnosis not present

## 2023-08-13 DIAGNOSIS — Z01810 Encounter for preprocedural cardiovascular examination: Secondary | ICD-10-CM | POA: Diagnosis not present

## 2023-08-13 DIAGNOSIS — R519 Headache, unspecified: Secondary | ICD-10-CM

## 2023-08-13 DIAGNOSIS — R944 Abnormal results of kidney function studies: Secondary | ICD-10-CM

## 2023-08-13 DIAGNOSIS — I10 Essential (primary) hypertension: Secondary | ICD-10-CM

## 2023-08-13 NOTE — Progress Notes (Signed)
Subjective:    Patient ID: [name], female    DOB: 1941-09-09, 82 y.o.   MRN: [id number]  HPI  Wt Readings from Last 3 Encounters:  08/13/23 132 lb 2 oz (59.9 kg)  08/07/23 132 lb 6 oz (60 kg)  06/19/23 132 lb 6 oz (60 kg)   24.56 kg/m  Vitals:   08/13/23 0922  BP: 106/64  Pulse: 82  Temp: 98.5 F (36.9 C)  SpO2: 98%    Pt presents for pre operative clearance for left reverse total shoulder arthroplasty  No date yet    Will have a pre osteoporosis visit with labs a [facility] as well Doing PT pre surgery now (6 weeks)   HTN bp is stable today  No cp or palpitations or headaches or edema  No side effects to medicines  BP Readings from Last 3 Encounters:  08/13/23 106/64  08/07/23 122/64  06/19/23 122/70    Lisinopril  5 mg daily  Lab Results  Component Value Date   NA 137 06/19/2023   K 4.6 06/19/2023   CO2 30 06/19/2023   GLUCOSE 91 06/19/2023   BUN 14 06/19/2023   CREATININE 1.08 06/19/2023   CALCIUM 9.3 06/19/2023   GFR 48.39 (L) 06/19/2023   GFRNONAA 56 (L) 12/02/2020   No hyperkalemia this last check  Only has one kidney    GFR was up to from 39.1 to 48.39  EKG today  NSR Rate 79 Low voltage in prec leads   No history of cardiac or pulmonary problems  No asthma   No shortness of breath on exertion  No chest pain or pressure  No recent cough or respiratory symptoms   No asa or nsaids   Does not do well with codiene  No other significant allergies No problems with adhesive or iodine   History of PUD Continues protonix  Feels like it is well controlled  Lab Results  Component Value Date   WBC 6.8 08/07/2023   HGB 12.9 08/07/2023   HCT 38.9 08/07/2023   MCV 92.8 08/07/2023   PLT 274.0 08/07/2023   Sees Dr Marina Goodell     Hyperlipidemia Lab Results  Component Value Date   CHOL 181 05/28/2023   HDL 45.60 05/28/2023   LDLCALC 107 (H) 05/28/2023   TRIG 140.0 05/28/2023   CHOLHDL 4 05/28/2023   On atorvastatin 10 mg  daily  Exercise tolerance  Can walk more than 1/4 mile with breaks/ does it daily    Last visit was for facial pain and paresthesia  Planning MRI of trigeminal nerve  Also neuro follow up    Patient Active Problem List   Diagnosis Date Noted   Pre-operative clearance 08/13/2023   Right facial pain 08/07/2023   Facial paresthesia 01/19/2023   Right groin pain 10/10/2022   Current use of proton pump inhibitor 04/18/2022   Decreased GFR 04/18/2022   Vitamin B12 deficiency 04/18/2022   Multiple gastric ulcers 04/27/2021   Effusion of right knee joint 05/31/2020   Pain in right knee 05/31/2020   Pain in joint of left shoulder 05/20/2020   Pain in right foot 04/08/2020   Closed fracture of shaft of right fibula 01/08/2020   Pain in joint of right ankle 01/08/2020   Hematoma of left breast 01/02/2020   Carpal tunnel syndrome 05/27/2018   Pain in joint of right foot 04/24/2018   Hyperkalemia 02/01/2018   Encounter for screening mammogram for breast cancer 01/19/2017   Estrogen deficiency 01/19/2017  Routine general medical examination at a health care facility 06/11/2015   Family history of colon cancer 11/14/2013   Colon cancer screening 11/14/2013   Hypertension 01/27/2011   Osteoporosis 01/27/2011   Low back pain radiating to left leg 03/13/2008   Hyperlipidemia 12/20/2007   CONSTIPATION, CHRONIC 12/20/2007   Prediabetes 12/20/2007   Past Medical History:  Diagnosis Date   Back pain    Chronic constipation    past hx- no current constipation 02-2021   Hyperlipidemia    Hypertension    Multiple gastric ulcers    Osteopenia    Single kidney    donated kidney    Past Surgical History:  Procedure Laterality Date   CLEFT PALATE REPAIR     COLONOSCOPY     FOOT NEUROMA SURGERY  2/06   NEPHRECTOMY LIVING DONOR     Left   TUBAL LIGATION     UPPER GASTROINTESTINAL ENDOSCOPY     Social History   Tobacco Use   Smoking status: Never   Smokeless tobacco: Never   Vaping Use   Vaping status: Never Used  Substance Use Topics   Alcohol use: No   Drug use: No   Family History  Problem Relation Age of Onset   Colon cancer Mother    Diabetes Mother    Coronary artery disease Mother    Osteoporosis Mother    Dementia Mother    Coronary artery disease Father    Heart failure Father    Hypertension Father    Stomach cancer Neg Hx    Pancreatic cancer Neg Hx    Esophageal cancer Neg Hx    Liver disease Neg Hx    Colon polyps Neg Hx    Rectal cancer Neg Hx    Breast cancer Neg Hx    Allergies  Allergen Reactions   Codeine    Hydrocodone Bit-Homatrop Mbr     Caused flu-like symptoms   Current Outpatient Medications on File Prior to Visit  Medication Sig Dispense Refill   atorvastatin (LIPITOR) 10 MG tablet TAKE 1 TABLET BY MOUTH EVERY DAY 90 tablet 1   Cyanocobalamin (B-12) 1000 MCG CAPS Take 1 capsule by mouth daily.     D 1000 25 MCG (1000 UT) capsule SMARTSIG:1 By Mouth     lisinopril (ZESTRIL) 10 MG tablet Take 5 mg by mouth daily.     pantoprazole (PROTONIX) 40 MG tablet Take 1 tablet (40 mg total) by mouth 2 (two) times daily. Office visit for further refills 180 tablet 0   No current facility-administered medications on file prior to visit.    Review of Systems  Constitutional:  Negative for activity change, appetite change, fatigue, fever and unexpected weight change.  HENT:  Negative for congestion, ear pain, rhinorrhea, sinus pressure and sore throat.   Eyes:  Negative for pain, redness and visual disturbance.  Respiratory:  Negative for cough, shortness of breath and wheezing.   Cardiovascular:  Negative for chest pain and palpitations.  Gastrointestinal:  Negative for abdominal pain, blood in stool, constipation and diarrhea.  Endocrine: Negative for polydipsia and polyuria.  Genitourinary:  Negative for dysuria, frequency and urgency.  Musculoskeletal:  Positive for arthralgias. Negative for back pain and myalgias.        Left shoulder pain   Skin:  Negative for pallor and rash.  Allergic/Immunologic: Negative for environmental allergies.  Neurological:  Negative for dizziness, syncope and headaches.  Hematological:  Negative for adenopathy. Does not bruise/bleed easily.  Psychiatric/Behavioral:  Negative for  decreased concentration and dysphoric mood. The patient is not nervous/anxious.        More issues with short term recall recently at 70  Does ok with it  May return to address further        Objective:   Physical Exam Constitutional:      General: She is not in acute distress.    Appearance: Normal appearance. She is well-developed and normal weight. She is not ill-appearing or diaphoretic.  HENT:     Head: Normocephalic and atraumatic.  Eyes:     Conjunctiva/sclera: Conjunctivae normal.     Pupils: Pupils are equal, round, and reactive to light.  Neck:     Thyroid: No thyromegaly.     Vascular: No carotid bruit or JVD.  Cardiovascular:     Rate and Rhythm: Normal rate and regular rhythm.     Heart sounds: Normal heart sounds.     No gallop.  Pulmonary:     Effort: Pulmonary effort is normal. No respiratory distress.     Breath sounds: Normal breath sounds. No wheezing or rales.  Abdominal:     General: There is no distension or abdominal bruit.     Palpations: Abdomen is soft. There is no mass.     Tenderness: There is no abdominal tenderness.  Musculoskeletal:        General: No deformity.     Cervical back: Normal range of motion and neck supple.     Right lower leg: No edema.     Left lower leg: No edema.  Lymphadenopathy:     Cervical: No cervical adenopathy.  Skin:    General: Skin is warm and dry.     Coloration: Skin is not pale.     Findings: No bruising, lesion or rash.  Neurological:     Mental Status: She is alert.     Coordination: Coordination normal.     Deep Tendon Reflexes: Reflexes are normal and symmetric. Reflexes normal.  Psychiatric:        Mood and  Affect: Mood normal.           Assessment & Plan:   Problem List Items Addressed This Visit       Cardiovascular and Mediastinum   Hypertension    BP: 106/64  Good control here and at home with 5 mg lisinopril (had elevated K on 10 mg) K and bmet better at this dose  Commended on lifestyle changes /healthy habits as well  Encouraged her to keep exercising         Other   Decreased GFR    Improved at lower dose of lisinopril (5 mg)  48.39 Conitnue to watch Encouraged good fluid intake  Has only one kidney      Pre-operative clearance    Getting ready for left reverse total shoulder repl  Unsure what anesthesia yet Has pre operative intake planned at [facility] Recent GFR improved Chronic problems are controlled incl HDL  No cardiac or pulm history  No issues with exercise intolerance -can walk over 1/4 mild with no problems  No blood thinners currently  Is medically clear with low risk       Right facial pain    Esr and crp were normal  MRI face/trigeminal nerve ordered Will follow up with neuro as well      Other Visit Diagnoses     Pre-operative cardiovascular examination    -  Primary   Relevant Orders   EKG 12-Lead (Completed)

## 2023-08-13 NOTE — Assessment & Plan Note (Signed)
Improved at lower dose of lisinopril (5 mg)  48.39 Conitnue to watch Encouraged good fluid intake  Has only one kidney

## 2023-08-13 NOTE — Assessment & Plan Note (Addendum)
Getting ready for left reverse total shoulder repl  Unsure what anesthesia yet Has pre operative intake planned at [facility] Recent GFR improved Chronic problems are controlled incl HTN Reassuring EKG No cardiac or pulm history  No issues with exercise intolerance -can walk over 1/4 mild with no problems  No blood thinners currently  Is medically clear with low risk

## 2023-08-13 NOTE — Patient Instructions (Signed)
Keep in shape physically and mentally   Continue chair yoga and walking   Goal is 30 or more minutes 5 days per week   No restrictions for surgery

## 2023-08-13 NOTE — Assessment & Plan Note (Signed)
Esr and crp were normal  MRI face/trigeminal nerve ordered Will follow up with neuro as well

## 2023-08-13 NOTE — Assessment & Plan Note (Signed)
BP: 106/64  Good control here and at home with 5 mg lisinopril (had elevated K on 10 mg) K and bmet better at this dose  Commended on lifestyle changes /healthy habits as well  Encouraged her to keep exercising

## 2023-08-14 DIAGNOSIS — M25512 Pain in left shoulder: Secondary | ICD-10-CM | POA: Diagnosis not present

## 2023-08-15 NOTE — Progress Notes (Signed)
NEUROLOGY FOLLOW UP OFFICE NOTE  [name] [id number]  Subjective:  [name] is a 82 y.o. year old female with a medical history of HTN, HLD, GERD, B12 deficiency, single kidney (donated other) who we last saw on 03/14/23 for right face and neck paresthesias.  To briefly review: Patient was being seen at [facility] for precancer on her face. She was using fluorourcil on her face starting around 11/2022. She did this for 2 weeks. Per patient, it pulls the precancer cells out. She shows me a picture of her face after treatment with changes throughout her face. During treatment (late 12/2022) she started having an abnormal sensation on the right neck into lower right face. She can rub her right jaw and help. It is happening 2-3 times per day, lasting a few seconds to 1 minute. It feels tingling, like something is crawling up her face. It is not a sharp or shooting pain that does not hurt. She has no lacrimation, ptosis, facial swelling, face drooping, or rhinorrhea. She denies neck pain or muscle tightness. She denies any triggers, including touching her face, brushing her teeth, or chewing.   She denies significant headaches or vision changes. She denies tingling or numbness in arms or legs.   She did do physical therapy for back and leg pain in 10/2022.   Patient takes B12 and vit D. She denies taking B complex.  Most recent Assessment and Plan (03/14/23): [name] is a 82 y.o. female who presents for evaluation of transient right face and neck paresthesias. She has a relevant medical history of HTN, HLD, GERD, B12 deficiency, single kidney. Her neurological examination is essentially normal today. She has some mild facial asymmetry with smile that patient states is normal for her. Available diagnostic data is significant for MRI and MRA brain that was normal. The etiology of patient's symptoms is currently unclear. Trigeminal neuralgia is a consideration, but symptoms  also include the neck and are not painful, which would be usual. Cervical radiculopathy is another consideration, but would be expected to be more painful and would not be expected to affect the face. It is possible that neck tension/tightness is causing the paresthesias. Overall, patient does not find the symptoms bothersome and prefers to avoid medication unless needed.   PLAN: -Discussed PT for neck tightness but patient would prefer to do home exercises found online. -Patient would prefer conservative management prior to considering medication, which is very reasonable in this case. -Patient will call if symptoms worsen or do not improve -If symptoms begin to appear more consistent with trigeminal neuralgia, could consider MRI trigeminal protocol.  Since their last visit: Patient's right face paresthesia has become more painful over the last couple of months. She describes a lightening bolt shooting sensation in right temple that is very quick (a few seconds) and relieved by pressing on it. She will also have several in a row, then go days without any symptoms. She estimates she has symptoms about 2 days a weeks. She denies any clear triggers such as chewing, brushing her teeth, or touching her face. It is never on the left side.   She also has changes to the sensation of her ear, like there is air in it. She denies vision changes or jaw pain. Hearing has not changed. Patient mentioned this to her PCP who ordered CRP and ESR to evaluate for temporal arteritis. There were normal.  Trigeminal nerve protocol MRI was also ordered and is scheduled for  09/25/23.  She also endorses memory difficulties. She does not remember details like appointments or obligations unless she writes it down. She denies any difficulty with ADLs. She denies getting lost when driving or walking in familiar areas. She endorses occasional difficulty sleeping due to left shoulder pain. She typically sleeps 6-7 hours. She feels  refreshed in the morning. She has not been told she snores. She does take daytime naps. In terms of mood, she denies significant depressive symptoms.  MEDICATIONS:  Outpatient Encounter Medications as of 08/17/2023  Medication Sig   atorvastatin (LIPITOR) 10 MG tablet TAKE 1 TABLET BY MOUTH EVERY DAY   Cyanocobalamin (B-12) 1000 MCG CAPS Take 1 capsule by mouth daily.   D 1000 25 MCG (1000 UT) capsule SMARTSIG:1 By Mouth   lisinopril (ZESTRIL) 10 MG tablet Take 5 mg by mouth daily.   pantoprazole (PROTONIX) 40 MG tablet Take 1 tablet (40 mg total) by mouth 2 (two) times daily. Office visit for further refills   No facility-administered encounter medications on file as of 08/17/2023.    PAST MEDICAL HISTORY: Past Medical History:  Diagnosis Date   Back pain    Chronic constipation    past hx- no current constipation 02-2021   Hyperlipidemia    Hypertension    Multiple gastric ulcers    Osteopenia    Single kidney    donated kidney     PAST SURGICAL HISTORY: Past Surgical History:  Procedure Laterality Date   CLEFT PALATE REPAIR     COLONOSCOPY     FOOT NEUROMA SURGERY  2/06   NEPHRECTOMY LIVING DONOR     Left   TUBAL LIGATION     UPPER GASTROINTESTINAL ENDOSCOPY      ALLERGIES: Allergies  Allergen Reactions   Codeine    Hydrocodone Bit-Homatrop Mbr     Caused flu-like symptoms    FAMILY HISTORY: Family History  Problem Relation Age of Onset   Colon cancer Mother    Diabetes Mother    Coronary artery disease Mother    Osteoporosis Mother    Dementia Mother    Coronary artery disease Father    Heart failure Father    Hypertension Father    Stomach cancer Neg Hx    Pancreatic cancer Neg Hx    Esophageal cancer Neg Hx    Liver disease Neg Hx    Colon polyps Neg Hx    Rectal cancer Neg Hx    Breast cancer Neg Hx     SOCIAL HISTORY: Social History   Tobacco Use   Smoking status: Never   Smokeless tobacco: Never  Vaping Use   Vaping status: Never Used   Substance Use Topics   Alcohol use: No   Drug use: No   Social History   Social History Narrative   Left handed    Are you right handed or left handed?    Are you currently employed ?    What is your current occupation? retired   Do you live at home alone?   Who lives with you? husband   What type of home do you live in: 1 story or 2 story? Two     Caffeine none      Objective:  Vital Signs:  BP 134/78   Pulse 84   Ht 5' 1.5" (1.562 m)   Wt 133 lb (60.3 kg)   SpO2 98%   BMI 24.72 kg/m   General: No acute distress.  Patient appears well-groomed.   Head:  Normocephalic/atraumatic Neck: Paraspinal and trapezius tenderness to palpation. Tightness of trapezius. Reduced range of motion of neck. Back: No paraspinal tenderness Heart: regular rate and rhythm Lungs: Clear to auscultation bilaterally. Vascular: No carotid bruits.  Neurological Exam: Mental status: alert and oriented, speech fluent and not dysarthric, language intact.  Cranial nerves: CN I: not tested CN II: pupils equal, round and reactive to light, visual fields intact CN III, IV, VI:  full range of motion, no nystagmus, no ptosis CN V: facial sensation intact. Unable to illicit symptoms with touching of face. CN VII: upper and lower face symmetric CN VIII: hearing intact CN IX, X: uvula midline CN XI: sternocleidomastoid and trapezius muscles intact CN XII: tongue midline  Bulk & Tone: normal, no fasciculations. Motor:  muscle strength 5/5 throughout Deep Tendon Reflexes:  2+ throughout.   Sensation:  Pinprick, temperature and vibratory sensation intact. Finger to nose testing:  Without dysmetria.   Heel to shin:  Without dysmetria.   Gait:  Normal station and stride.  Romberg negative.   Labs and Imaging review: New results: 08/07/23: CRP < 1.0 ESR 12 CBC w/ diff unremarkable (MCV 92)  BMP (06/19/23) unremarkable  05/28/23: TSH wnl HbA1c: 6.0 B12: 828 Vit D wnl  Previously reviewed  results: Lab Results  Component Value Date    HGBA1C 6.1 04/18/2022      Recent Labs       Lab Results  Component Value Date    VITAMINB12 826 01/19/2023      Recent Labs       Lab Results  Component Value Date    TSH 2.33 04/18/2022      Recent Labs[] Expand by Default       Lab Results  Component Value Date    ESRSEDRATE 4 01/19/2023      CBC and BMP (01/19/23) unremarkable   Imaging: MRI brain/MRA head w/wo contrast (01/30/23): FINDINGS: MRI HEAD FINDINGS   A routine protocol brain MRI (without and with contrast) was ordered and performed.   Brain:   Mild generalized cerebral atrophy.   Multifocal T2 FLAIR hyperintense signal abnormality within the cerebral white matter, nonspecific but compatible with mild chronic small vessel ischemic disease.   There is no acute infarct.   No evidence of an intracranial mass.   No convincing chronic intracranial blood products.   No extra-axial fluid collection.   No midline shift.   No pathologic intracranial enhancement identified.   Vascular: Maintained flow voids within the proximal large arterial vessels.   Skull and upper cervical spine: No focal suspicious marrow lesion. Incompletely assessed cervical spondylosis.   Sinuses/Orbits: No mass or acute finding within the imaged orbits. Small mucous retention cyst within the right maxillary sinus.   Other: Small-volume fluid within the bilateral mastoid air cells.   MRA HEAD FINDINGS   Anterior circulation:   The intracranial internal carotid arteries are patent. The M1 middle cerebral arteries are patent. No M2 proximal branch occlusion or high-grade proximal stenosis. The anterior cerebral arteries are patent. Hypoplastic right A1 segment 1-2 mm laterally projecting vascular protrusion arising from the cavernous right ICA which may reflect an aneurysm or the origin of an otherwise poorly delineated branch vessel (for instance as seen on series 5,  image 58). 2 mm inferiorly projecting vascular protrusion arising from the distal cavernous left ICA compatible with an aneurysm (for instance as seen on series 5, image 63) (series 107, image 134).   Posterior circulation:   The visualized intracranial vertebral arteries are  patent. The basilar artery is patent. The posterior cerebral arteries are patent. Posterior communicating arteries are diminutive or absent, bilaterally.   Anatomic variants: As described.   IMPRESSION: MRI brain: 1. No evidence of an acute intracranial abnormality. 2. No specific cause of facial pain/paresthesia is identified. If symptoms persist, a trigeminal nerve protocol MRI of the face (without and with contrast) may be obtained for further evaluation. 3. Mild chronic small vessel ischemic changes within the cerebral white matter. 4. Mild generalized cerebral atrophy. 5. Small mucous retention cyst within the right maxillary sinus. 6. Small-volume fluid within the bilateral mastoid air cells.   MRA head: 1. No specific arterial cause of the reported symptoms is identified. 2. No intracranial large vessel occlusion or proximal high-grade arterial stenosis. 3. 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 4. 1-2 mm laterally projecting vascular protrusion arising from the cavernous right ICA, which may reflect an aneurysm or the origin of an otherwise poorly delineated branch vessel.  Assessment/Plan:  This is [name], a 82 y.o. female with: Right facial pain - sharp, electric, lasting a few seconds in V1 > V2 and V3. May be consistent with trigeminal neuralgia, but is without the usual features of being triggered by touch and may actually be relieved by pressure. She has no jaw claudication, vision changes, or temporal pain to palpation and ESR and CRP are normal, making temporal arteritis unlikely. Right facial paresthesias - itching and crawling sensation in right face. May be  related to #1 above vs #3 below Neck pain/tightness Memory changes - mild memory difficulties. Will monitor.  Plan: -PT for neck pain/tightness (patient is currently going to PT for [facility], will send there and see if they can add on) -Discussed medication, such as carbamazepine, but patient defers for now until MRI and PT. She will call if she changes her mind. -Follow up on trigeminal nerve protocol MRI (09/25/23)  Return to clinic in 2 months  Total time spent reviewing records, interview, history/exam, documentation, and coordination of care on day of encounter:  35 min  Jacquelyne Balint, MD

## 2023-08-17 ENCOUNTER — Encounter: Payer: Self-pay | Admitting: Neurology

## 2023-08-17 ENCOUNTER — Ambulatory Visit: Payer: Medicare HMO | Admitting: Neurology

## 2023-08-17 VITALS — BP 134/78 | HR 84 | Ht 61.5 in | Wt 133.0 lb

## 2023-08-17 DIAGNOSIS — M542 Cervicalgia: Secondary | ICD-10-CM | POA: Diagnosis not present

## 2023-08-17 DIAGNOSIS — G500 Trigeminal neuralgia: Secondary | ICD-10-CM | POA: Diagnosis not present

## 2023-08-17 DIAGNOSIS — R519 Headache, unspecified: Secondary | ICD-10-CM | POA: Diagnosis not present

## 2023-08-17 NOTE — Patient Instructions (Addendum)
I saw you today for right face pain. This could be trigeminal neuralgia and/or your neck pain and tightness.  I will follow up on the MRI you have scheduled for 09/25/23 to look at the trigeminal nerve and be in touch when I have that result.  We discussed medication for trigeminal neuralgia, but you preferred to try therapy for your neck pain and tightness first. If you change your mind or symptoms worsen, please let me know.  I am sending a physical therapy referral to [facility] so that they can also work with your neck as this can help the neck pain but may also help the facial pain symptoms.  I want to see you back in clinic in 2 months. Please let me know if you have any questions or concerns in the meantime.  The physicians and staff at [facility] are committed to providing excellent care. You may receive a survey requesting feedback about your experience at our office. We strive to receive "very good" responses to the survey questions. If you feel that your experience would prevent you from giving the office a "very good " response, please contact our office to try to remedy the situation. We may be reached at [phone]. Thank you for taking the time out of your busy day to complete the survey.  Jacquelyne Balint, MD [facility]

## 2023-08-22 DIAGNOSIS — M25512 Pain in left shoulder: Secondary | ICD-10-CM | POA: Diagnosis not present

## 2023-08-29 DIAGNOSIS — M25512 Pain in left shoulder: Secondary | ICD-10-CM | POA: Diagnosis not present

## 2023-09-05 DIAGNOSIS — M25512 Pain in left shoulder: Secondary | ICD-10-CM | POA: Diagnosis not present

## 2023-09-10 DIAGNOSIS — M25512 Pain in left shoulder: Secondary | ICD-10-CM | POA: Diagnosis not present

## 2023-09-12 NOTE — H&P (Signed)
Patient's anticipated LOS is less than 2 midnights, meeting these requirements: - Younger than 20 - Lives within 1 hour of care - Has a competent adult at home to recover with post-op recover - NO history of  - Chronic pain requiring opiods  - Diabetes  - Coronary Artery Disease  - Heart failure  - Heart attack  - Stroke  - DVT/VTE  - Cardiac arrhythmia  - Respiratory Failure/COPD  - Renal failure  - Anemia  - Advanced Liver disease     [name] is an 82 y.o. female.    Chief Complaint: left shoulder pain  HPI: Pt is a 82 y.o. female complaining of left shoulder pain for multiple years. Pain had continually increased since the beginning. X-rays in the clinic show end-stage arthritic changes of the left shoulder. Pt has tried various conservative treatments which have failed to alleviate their symptoms, including injections and therapy. Various options are discussed with the patient. Risks, benefits and expectations were discussed with the patient. Patient understand the risks, benefits and expectations and wishes to proceed with surgery.   PCP:  Judy Pimple, MD  D/C Plans: Home  PMH: Past Medical History:  Diagnosis Date   Back pain    Chronic constipation    past hx- no current constipation 02-2021   Hyperlipidemia    Hypertension    Multiple gastric ulcers    Osteopenia    Single kidney    donated kidney     PSH: Past Surgical History:  Procedure Laterality Date   CLEFT PALATE REPAIR     COLONOSCOPY     FOOT NEUROMA SURGERY  2/06   NEPHRECTOMY LIVING DONOR     Left   TUBAL LIGATION     UPPER GASTROINTESTINAL ENDOSCOPY      Social History:  reports that she has never smoked. She has never used smokeless tobacco. She reports that she does not drink alcohol and does not use drugs. BMI: Estimated body mass index is 24.72 kg/m as calculated from the following:   Height as of 08/17/23: 5' 1.5" (1.562 m).   Weight as of 08/17/23: 60.3 kg.  Lab  Results  Component Value Date   ALBUMIN 4.1 05/28/2023   Diabetes: Patient does not have a diagnosis of diabetes. Lab Results  Component Value Date   HGBA1C 6.0 05/28/2023     Smoking Status:   reports that she has never smoked. She has never used smokeless tobacco.    Allergies:  Allergies  Allergen Reactions   Codeine    Hydrocodone Bit-Homatrop Mbr     Caused flu-like symptoms    Medications: No current facility-administered medications for this encounter.   Current Outpatient Medications  Medication Sig Dispense Refill   atorvastatin (LIPITOR) 10 MG tablet TAKE 1 TABLET BY MOUTH EVERY DAY 90 tablet 1   Cyanocobalamin (B-12) 1000 MCG CAPS Take 1 capsule by mouth daily.     D 1000 25 MCG (1000 UT) capsule SMARTSIG:1 By Mouth     lisinopril (ZESTRIL) 10 MG tablet Take 5 mg by mouth daily.     pantoprazole (PROTONIX) 40 MG tablet Take 1 tablet (40 mg total) by mouth 2 (two) times daily. Office visit for further refills 180 tablet 0    No results found for this or any previous visit (from the past 48 hour(s)). No results found.  ROS: Pain with rom of the left upper extremity  Physical Exam: Alert and oriented 81 y.o. female in no acute distress  Cranial nerves 2-12 intact Cervical spine: full rom with no tenderness, nv intact distally Chest: active breath sounds bilaterally, no wheeze rhonchi or rales Heart: regular rate and rhythm, no murmur Abd: non tender non distended with active bowel sounds Hip is stable with rom  Left shoulder painful and weak rom Nv intact distally No rashes or edema distally  Assessment/Plan Assessment: left shoulder cuff arthropathy  Plan:  Patient will undergo a left reverse total shoulder by Dr. Ranell Patrick at [facility] Risks benefits and expectations were discussed with the patient. Patient understand risks, benefits and expectations and wishes to proceed. Preoperative templating of the joint replacement has been completed, documented,  and submitted to the Operating Room personnel in order to optimize intra-operative equipment management.   Alphonsa Overall PA-C, MPAS [facility] is now [employer] [address], [place], [place] [address] Phone: [phone] [email or web address] Facebook  [employer]

## 2023-09-18 NOTE — Patient Instructions (Addendum)
SURGICAL WAITING ROOM VISITATION  Patients having surgery or a procedure may have no more than 2 support people in the waiting area - these visitors may rotate.    Children under the age of 35 must have an adult with them who is not the patient.  Due to an increase in RSV and influenza rates and associated hospitalizations, children ages 58 and under may not visit patients in [facility] hospitals.  If the patient needs to stay at the hospital during part of their recovery, the visitor guidelines for inpatient rooms apply. Pre-op nurse will coordinate an appropriate time for 1 support person to accompany patient in pre-op.  This support person may not rotate.    Please refer to the [facility] website for the visitor guidelines for Inpatients (after your surgery is over and you are in a regular room).       Your procedure is scheduled on:  09/28/2023    Report to [facility] Main Entrance    Report to admitting at   0800AM   Call this number if you have problems the morning of surgery [phone]   Do not eat food :After Midnight.   After Midnight you may have the following liquids until __0730____ AM  DAY OF SURGERY  Water Non-Citrus Juices (without pulp, NO RED-Apple, White grape, White cranberry) Black Coffee (NO MILK/CREAM OR CREAMERS, sugar ok)  Clear Tea (NO MILK/CREAM OR CREAMERS, sugar ok) regular and decaf                             Plain Jell-O (NO RED)                                           Fruit ices (not with fruit pulp, NO RED)                                     Popsicles (NO RED)                                                               Sports drinks like Gatorade (NO RED)                    The day of surgery:  Drink ONE (1) Pre-Surgery Clear Ensure or G2 at  0730AM ( have completed by )  the morning of surgery. Drink in one sitting. Do not sip.  This drink was given to you during your hospital  pre-op appointment visit. Nothing else to  drink after completing the  Pre-Surgery Clear Ensure or G2.          If you have questions, please contact your surgeon's office.      Oral Hygiene is also important to reduce your risk of infection.                                    Remember - BRUSH YOUR TEETH THE MORNING OF SURGERY WITH YOUR REGULAR  TOOTHPASTE  DENTURES WILL BE REMOVED PRIOR TO SURGERY PLEASE DO NOT APPLY "Poly grip" OR ADHESIVES!!!   Do NOT smoke after Midnight   Stop all vitamins and herbal supplements 7 days before surgery.   Take these medicines the morning of surgery with A SIP OF WATER:  protonix   DO NOT TAKE ANY ORAL DIABETIC MEDICATIONS DAY OF YOUR SURGERY  Bring CPAP mask and tubing day of surgery.                              You may not have any metal on your body including hair pins, jewelry, and body piercing             Do not wear make-up, lotions, powders, perfumes/cologne, or deodorant  Do not wear nail polish including gel and S&S, artificial/acrylic nails, or any other type of covering on natural nails including finger and toenails. If you have artificial nails, gel coating, etc. that needs to be removed by a nail salon please have this removed prior to surgery or surgery may need to be canceled/ delayed if the surgeon/ anesthesia feels like they are unable to be safely monitored.   Do not shave  48 hours prior to surgery.               Men may shave face and neck.   Do not bring valuables to the hospital. [facility] IS NOT             RESPONSIBLE   FOR VALUABLES.   Contacts, glasses, dentures or bridgework may not be worn into surgery.   Bring small overnight bag day of surgery.   DO NOT BRING YOUR HOME MEDICATIONS TO THE HOSPITAL. PHARMACY WILL DISPENSE MEDICATIONS LISTED ON YOUR MEDICATION LIST TO YOU DURING YOUR ADMISSION IN THE HOSPITAL!    Patients discharged on the day of surgery will not be allowed to drive home.  Someone NEEDS to stay with you for the first 24 hours after  anesthesia.   Special Instructions: Bring a copy of your healthcare power of attorney and living will documents the day of surgery if you haven't scanned them before.              Please read over the following fact sheets you were given: IF YOU HAVE QUESTIONS ABOUT YOUR PRE-OP INSTRUCTIONS PLEASE CALL [phone]   If you received a COVID test during your pre-op visit  it is requested that you wear a mask when out in public, stay away from anyone that may not be feeling well and notify your surgeon if you develop symptoms. If you test positive for Covid or have been in contact with anyone that has tested positive in the last 10 days please notify you surgeon.      Pre-operative 5 CHG Bath Instructions   You can play a key role in reducing the risk of infection after surgery. Your skin needs to be as free of germs as possible. You can reduce the number of germs on your skin by washing with CHG (chlorhexidine gluconate) soap before surgery. CHG is an antiseptic soap that kills germs and continues to kill germs even after washing.   DO NOT use if you have an allergy to chlorhexidine/CHG or antibacterial soaps. If your skin becomes reddened or irritated, stop using the CHG and notify one of our RNs at [phone].   Please shower with the CHG soap starting 4 days  before surgery using the following schedule:     Please keep in mind the following:  DO NOT shave, including legs and underarms, starting the day of your first shower.   You may shave your face at any point before/day of surgery.  Place clean sheets on your bed the day you start using CHG soap. Use a clean washcloth (not used since being washed) for each shower. DO NOT sleep with pets once you start using the CHG.   CHG Shower Instructions:  If you choose to wash your hair and private area, wash first with your normal shampoo/soap.  After you use shampoo/soap, rinse your hair and body thoroughly to remove shampoo/soap residue.   Turn the water OFF and apply about 3 tablespoons (45 ml) of CHG soap to a CLEAN washcloth.  Apply CHG soap ONLY FROM YOUR NECK DOWN TO YOUR TOES (washing for 3-5 minutes)  DO NOT use CHG soap on face, private areas, open wounds, or sores.  Pay special attention to the area where your surgery is being performed.  If you are having back surgery, having someone wash your back for you may be helpful. Wait 2 minutes after CHG soap is applied, then you may rinse off the CHG soap.  Pat dry with a clean towel  Put on clean clothes/pajamas   If you choose to wear lotion, please use ONLY the CHG-compatible lotions on the back of this paper.     Additional instructions for the day of surgery: DO NOT APPLY any lotions, deodorants, cologne, or perfumes.   Put on clean/comfortable clothes.  Brush your teeth.  Ask your nurse before applying any prescription medications to the skin.      CHG Compatible Lotions   Aveeno Moisturizing lotion  Cetaphil Moisturizing Cream  Cetaphil Moisturizing Lotion  Clairol Herbal Essence Moisturizing Lotion, Dry Skin  Clairol Herbal Essence Moisturizing Lotion, Extra Dry Skin  Clairol Herbal Essence Moisturizing Lotion, Normal Skin  Curel Age Defying Therapeutic Moisturizing Lotion with Alpha Hydroxy  Curel Extreme Care Body Lotion  Curel Soothing Hands Moisturizing Hand Lotion  Curel Therapeutic Moisturizing Cream, Fragrance-Free  Curel Therapeutic Moisturizing Lotion, Fragrance-Free  Curel Therapeutic Moisturizing Lotion, Original Formula  Eucerin Daily Replenishing Lotion  Eucerin Dry Skin Therapy Plus Alpha Hydroxy Crme  Eucerin Dry Skin Therapy Plus Alpha Hydroxy Lotion  Eucerin Original Crme  Eucerin Original Lotion  Eucerin Plus Crme Eucerin Plus Lotion  Eucerin TriLipid Replenishing Lotion  Keri Anti-Bacterial Hand Lotion  Keri Deep Conditioning Original Lotion Dry Skin Formula Softly Scented  Keri Deep Conditioning Original Lotion, Fragrance  Free Sensitive Skin Formula  Keri Lotion Fast Absorbing Fragrance Free Sensitive Skin Formula  Keri Lotion Fast Absorbing Softly Scented Dry Skin Formula  Keri Original Lotion  Keri Skin Renewal Lotion Keri Silky Smooth Lotion  Keri Silky Smooth Sensitive Skin Lotion  Nivea Body Creamy Conditioning Oil  Nivea Body Extra Enriched Lotion  Nivea Body Original Lotion  Nivea Body Sheer Moisturizing Lotion Nivea Crme  Nivea Skin Firming Lotion  NutraDerm 30 Skin Lotion  NutraDerm Skin Lotion  NutraDerm Therapeutic Skin Cream  NutraDerm Therapeutic Skin Lotion  ProShield Protective Hand Cream  Provon moisturizing lotion   [place]- Preparing for Total Shoulder Arthroplasty    Before surgery, you can play an important role. Because skin is not sterile, your skin needs to be as free of germs as possible. You can reduce the number of germs on your skin by using the following products. Benzoyl Peroxide Gel Reduces  the number of germs present on the skin Applied twice a day to shoulder area starting two days before surgery    ==================================================================  Please follow these instructions carefully:  BENZOYL PEROXIDE 5% GEL  Please do not use if you have an allergy to benzoyl peroxide.   If your skin becomes reddened/irritated stop using the benzoyl peroxide.  Starting two days before surgery, apply as follows: Apply benzoyl peroxide in the morning and at night. Apply after taking a shower. If you are not taking a shower clean entire shoulder front, back, and side along with the armpit with a clean wet washcloth.  Place a quarter-sized dollop on your shoulder and rub in thoroughly, making sure to cover the front, back, and side of your shoulder, along with the armpit.   2 days before ____ AM   ____ PM              1 day before ____ AM   ____ PM                         Do this twice a day for two days.  (Last application is the night before surgery,  AFTER using the CHG soap as described below).  Do NOT apply benzoyl peroxide gel on the day of surgery.

## 2023-09-18 NOTE — Progress Notes (Addendum)
Anesthesia Review:  PCP: Cardiologist : Chest x-ray : EKG : 08/13/23  Echo : Stress test: Cardiac Cath :  Activity level:  Sleep Study/ CPAP : Fasting Blood Sugar :      / Checks Blood Sugar -- times a day:   Blood Thinner/ Instructions /Last Dose: ASA / Instructions/ Last Dose :    81 mg aspirin

## 2023-09-19 ENCOUNTER — Encounter: Payer: Self-pay | Admitting: Family Medicine

## 2023-09-20 ENCOUNTER — Encounter (HOSPITAL_COMMUNITY)
Admission: RE | Admit: 2023-09-20 | Discharge: 2023-09-20 | Disposition: A | Discharge status: Home / Self Care | Payer: Medicare HMO | Source: Ambulatory Visit | Attending: Orthopedic Surgery | Admitting: Orthopedic Surgery

## 2023-09-20 ENCOUNTER — Encounter (HOSPITAL_COMMUNITY): Payer: Self-pay

## 2023-09-20 ENCOUNTER — Other Ambulatory Visit: Payer: Self-pay

## 2023-09-20 VITALS — BP 145/87 | HR 76 | Temp 98.8°F | Resp 16 | Ht 61.5 in | Wt 128.0 lb

## 2023-09-20 DIAGNOSIS — Z01818 Encounter for other preprocedural examination: Secondary | ICD-10-CM

## 2023-09-20 DIAGNOSIS — R519 Headache, unspecified: Secondary | ICD-10-CM | POA: Diagnosis not present

## 2023-09-20 DIAGNOSIS — Z01812 Encounter for preprocedural laboratory examination: Secondary | ICD-10-CM | POA: Insufficient documentation

## 2023-09-20 HISTORY — DX: Other specified postprocedural states: Z98.890

## 2023-09-20 HISTORY — DX: Nausea with vomiting, unspecified: R11.2

## 2023-09-20 LAB — CBC
HCT: 41 % (ref 36.0–46.0)
Hemoglobin: 13 g/dL (ref 12.0–15.0)
MCH: 30.8 pg (ref 26.0–34.0)
MCHC: 31.7 g/dL (ref 30.0–36.0)
MCV: 97.2 fL (ref 80.0–100.0)
Platelets: 291 10*3/uL (ref 150–400)
RBC: 4.22 MIL/uL (ref 3.87–5.11)
RDW: 12.9 % (ref 11.5–15.5)
WBC: 6.2 10*3/uL (ref 4.0–10.5)
nRBC: 0 % (ref 0.0–0.2)

## 2023-09-20 LAB — BASIC METABOLIC PANEL
Anion gap: 8 (ref 5–15)
BUN: 14 mg/dL (ref 8–23)
CO2: 25 mmol/L (ref 22–32)
Calcium: 9.4 mg/dL (ref 8.9–10.3)
Chloride: 103 mmol/L (ref 98–111)
Creatinine, Ser: 0.96 mg/dL (ref 0.44–1.00)
GFR, Estimated: 59 mL/min — ABNORMAL LOW (ref >60)
Glucose, Bld: 116 mg/dL — ABNORMAL HIGH (ref 70–99)
Potassium: 4.7 mmol/L (ref 3.5–5.1)
Sodium: 136 mmol/L (ref 135–145)

## 2023-09-20 LAB — SURGICAL PCR SCREEN
MRSA, PCR: NEGATIVE
Staphylococcus aureus: NEGATIVE

## 2023-09-25 ENCOUNTER — Other Ambulatory Visit: Payer: Medicare HMO

## 2023-09-27 NOTE — Anesthesia Preprocedure Evaluation (Addendum)
Anesthesia Evaluation  Patient identified by MRN, date of birth, ID band Patient awake    Reviewed: Allergy & Precautions, NPO status , Patient's Chart, lab work & pertinent test results  History of Anesthesia Complications (+) PONV and history of anesthetic complications  Airway Mallampati: II  TM Distance: >3 FB Neck ROM: Full    Dental no notable dental hx.    Pulmonary neg pulmonary ROS   Pulmonary exam normal        Cardiovascular hypertension, Pt. on medications Normal cardiovascular exam     Neuro/Psych negative neurological ROS     GI/Hepatic Neg liver ROS, PUD,GERD  Medicated,,  Endo/Other  negative endocrine ROS    Renal/GU Solitary kidney  negative genitourinary   Musculoskeletal Left shoulder rotator cuff arthropathy   Abdominal   Peds  Hematology negative hematology ROS (+)   Anesthesia Other Findings Day of surgery medications reviewed with patient.  Reproductive/Obstetrics                             Anesthesia Physical Anesthesia Plan  ASA: 2  Anesthesia Plan: General   Post-op Pain Management: Tylenol PO (pre-op)* and Regional block*   Induction: Intravenous  PONV Risk Score and Plan: 4 or greater and Treatment may vary due to age or medical condition, Dexamethasone, Ondansetron, Propofol infusion and TIVA  Airway Management Planned: Oral ETT  Additional Equipment: None  Intra-op Plan:   Post-operative Plan: Extubation in OR  Informed Consent: I have reviewed the patients History and Physical, chart, labs and discussed the procedure including the risks, benefits and alternatives for the proposed anesthesia with the patient or authorized representative who has indicated his/her understanding and acceptance.     Dental advisory given  Plan Discussed with: CRNA  Anesthesia Plan Comments:        Anesthesia Quick Evaluation

## 2023-09-28 ENCOUNTER — Other Ambulatory Visit: Payer: Self-pay

## 2023-09-28 ENCOUNTER — Encounter (HOSPITAL_COMMUNITY): Payer: Self-pay | Admitting: Orthopedic Surgery

## 2023-09-28 ENCOUNTER — Ambulatory Visit (HOSPITAL_COMMUNITY): Payer: Medicare HMO

## 2023-09-28 ENCOUNTER — Ambulatory Visit (HOSPITAL_COMMUNITY): Payer: Medicare HMO | Admitting: Physician Assistant

## 2023-09-28 ENCOUNTER — Other Ambulatory Visit (HOSPITAL_COMMUNITY): Payer: Self-pay

## 2023-09-28 ENCOUNTER — Encounter (HOSPITAL_COMMUNITY)
Admission: RE | Disposition: A | Discharge status: Home / Self Care | Payer: Self-pay | Source: Ambulatory Visit | Attending: Orthopedic Surgery

## 2023-09-28 ENCOUNTER — Ambulatory Visit (HOSPITAL_COMMUNITY)
Admission: RE | Admit: 2023-09-28 | Discharge: 2023-09-28 | Disposition: A | Discharge status: Home / Self Care | Payer: Medicare HMO | Source: Ambulatory Visit | Attending: Orthopedic Surgery | Admitting: Orthopedic Surgery

## 2023-09-28 ENCOUNTER — Ambulatory Visit (HOSPITAL_COMMUNITY): Payer: Medicare HMO | Admitting: Anesthesiology

## 2023-09-28 DIAGNOSIS — Z79899 Other long term (current) drug therapy: Secondary | ICD-10-CM | POA: Diagnosis not present

## 2023-09-28 DIAGNOSIS — I10 Essential (primary) hypertension: Secondary | ICD-10-CM | POA: Diagnosis not present

## 2023-09-28 DIAGNOSIS — M75122 Complete rotator cuff tear or rupture of left shoulder, not specified as traumatic: Secondary | ICD-10-CM | POA: Diagnosis not present

## 2023-09-28 DIAGNOSIS — M25712 Osteophyte, left shoulder: Secondary | ICD-10-CM | POA: Diagnosis not present

## 2023-09-28 DIAGNOSIS — M12812 Other specific arthropathies, not elsewhere classified, left shoulder: Secondary | ICD-10-CM

## 2023-09-28 DIAGNOSIS — Z471 Aftercare following joint replacement surgery: Secondary | ICD-10-CM | POA: Diagnosis not present

## 2023-09-28 DIAGNOSIS — M129 Arthropathy, unspecified: Secondary | ICD-10-CM | POA: Diagnosis not present

## 2023-09-28 DIAGNOSIS — Z8711 Personal history of peptic ulcer disease: Secondary | ICD-10-CM | POA: Diagnosis not present

## 2023-09-28 DIAGNOSIS — M19012 Primary osteoarthritis, left shoulder: Secondary | ICD-10-CM | POA: Diagnosis not present

## 2023-09-28 DIAGNOSIS — K219 Gastro-esophageal reflux disease without esophagitis: Secondary | ICD-10-CM | POA: Diagnosis not present

## 2023-09-28 DIAGNOSIS — Z96612 Presence of left artificial shoulder joint: Secondary | ICD-10-CM | POA: Diagnosis not present

## 2023-09-28 DIAGNOSIS — M75102 Unspecified rotator cuff tear or rupture of left shoulder, not specified as traumatic: Secondary | ICD-10-CM | POA: Insufficient documentation

## 2023-09-28 DIAGNOSIS — Z01818 Encounter for other preprocedural examination: Secondary | ICD-10-CM

## 2023-09-28 DIAGNOSIS — G8918 Other acute postprocedural pain: Secondary | ICD-10-CM | POA: Diagnosis not present

## 2023-09-28 HISTORY — PX: REVERSE SHOULDER ARTHROPLASTY: SHX5054

## 2023-09-28 SURGERY — ARTHROPLASTY, SHOULDER, TOTAL, REVERSE
Anesthesia: General | Site: Shoulder | Laterality: Left

## 2023-09-28 MED ORDER — ROCURONIUM BROMIDE 100 MG/10ML IV SOLN
INTRAVENOUS | Status: DC | PRN
  Administered 2023-09-28: 50 mg via INTRAVENOUS

## 2023-09-28 MED ORDER — PHENYLEPHRINE 80 MCG/ML (10ML) SYRINGE FOR IV PUSH (FOR BLOOD PRESSURE SUPPORT)
PREFILLED_SYRINGE | INTRAVENOUS | Status: AC
  Filled 2023-09-28: qty 10

## 2023-09-28 MED ORDER — BUPIVACAINE-EPINEPHRINE (PF) 0.25% -1:200000 IJ SOLN
INTRAMUSCULAR | Status: DC | PRN
  Administered 2023-09-28: 15 mL

## 2023-09-28 MED ORDER — PROPOFOL 10 MG/ML IV BOLUS
INTRAVENOUS | Status: AC
  Filled 2023-09-28: qty 20

## 2023-09-28 MED ORDER — 0.9 % SODIUM CHLORIDE (POUR BTL) OPTIME
TOPICAL | Status: DC | PRN
  Administered 2023-09-28: 1000 mL

## 2023-09-28 MED ORDER — EPHEDRINE 5 MG/ML INJ
INTRAVENOUS | Status: AC
  Filled 2023-09-28: qty 5

## 2023-09-28 MED ORDER — SUGAMMADEX SODIUM 200 MG/2ML IV SOLN
INTRAVENOUS | Status: DC | PRN
  Administered 2023-09-28: 200 mg via INTRAVENOUS

## 2023-09-28 MED ORDER — ONDANSETRON HCL 4 MG/2ML IJ SOLN
INTRAMUSCULAR | Status: DC | PRN
  Administered 2023-09-28: 4 mg via INTRAVENOUS

## 2023-09-28 MED ORDER — ACETAMINOPHEN 500 MG PO TABS
1000.0000 mg | ORAL_TABLET | Freq: Once | ORAL | Status: AC
  Administered 2023-09-28: 1000 mg via ORAL
  Filled 2023-09-28: qty 2

## 2023-09-28 MED ORDER — TRANEXAMIC ACID-NACL 1000-0.7 MG/100ML-% IV SOLN
1000.0000 mg | INTRAVENOUS | Status: AC
Start: 2023-09-28 — End: 2023-09-28
  Administered 2023-09-28: 1000 mg via INTRAVENOUS
  Filled 2023-09-28: qty 100

## 2023-09-28 MED ORDER — PHENYLEPHRINE HCL-NACL 20-0.9 MG/250ML-% IV SOLN
INTRAVENOUS | Status: DC | PRN
  Administered 2023-09-28: 20 ug/min via INTRAVENOUS

## 2023-09-28 MED ORDER — LIDOCAINE HCL (CARDIAC) PF 100 MG/5ML IV SOSY
PREFILLED_SYRINGE | INTRAVENOUS | Status: DC | PRN
  Administered 2023-09-28: 60 mg via INTRAVENOUS

## 2023-09-28 MED ORDER — PROPOFOL 1000 MG/100ML IV EMUL
INTRAVENOUS | Status: AC
  Filled 2023-09-28: qty 100

## 2023-09-28 MED ORDER — CEFAZOLIN SODIUM-DEXTROSE 2-4 GM/100ML-% IV SOLN
2.0000 g | INTRAVENOUS | Status: AC
  Administered 2023-09-28: 2 g via INTRAVENOUS
  Filled 2023-09-28: qty 100

## 2023-09-28 MED ORDER — DEXAMETHASONE SODIUM PHOSPHATE 10 MG/ML IJ SOLN
INTRAMUSCULAR | Status: DC | PRN
  Administered 2023-09-28: 10 mg via INTRAVENOUS

## 2023-09-28 MED ORDER — TRANEXAMIC ACID-NACL 1000-0.7 MG/100ML-% IV SOLN: 1000.0000 mg | Freq: Once | INTRAVENOUS | Status: DC

## 2023-09-28 MED ORDER — FENTANYL CITRATE (PF) 100 MCG/2ML IJ SOLN
INTRAMUSCULAR | Status: DC | PRN
  Administered 2023-09-28: 100 ug via INTRAVENOUS

## 2023-09-28 MED ORDER — PROPOFOL 10 MG/ML IV BOLUS
INTRAVENOUS | Status: DC | PRN
  Administered 2023-09-28: 50 mg via INTRAVENOUS
  Administered 2023-09-28: 110 mg via INTRAVENOUS

## 2023-09-28 MED ORDER — FENTANYL CITRATE (PF) 100 MCG/2ML IJ SOLN
INTRAMUSCULAR | Status: AC
  Filled 2023-09-28: qty 2

## 2023-09-28 MED ORDER — OXYCODONE HCL 5 MG/5ML PO SOLN: 5.0000 mg | Freq: Once | ORAL | Status: DC | PRN

## 2023-09-28 MED ORDER — OXYCODONE HCL 5 MG PO TABS: 5.0000 mg | ORAL_TABLET | Freq: Once | ORAL | Status: DC | PRN

## 2023-09-28 MED ORDER — CHLORHEXIDINE GLUCONATE 0.12 % MT SOLN
15.0000 mL | Freq: Once | OROMUCOSAL | Status: AC
  Administered 2023-09-28: 15 mL via OROMUCOSAL

## 2023-09-28 MED ORDER — ONDANSETRON HCL 4 MG/2ML IJ SOLN
INTRAMUSCULAR | Status: AC
  Filled 2023-09-28: qty 2

## 2023-09-28 MED ORDER — FENTANYL CITRATE PF 50 MCG/ML IJ SOSY: 25.0000 ug | PREFILLED_SYRINGE | INTRAMUSCULAR | Status: DC | PRN

## 2023-09-28 MED ORDER — FENTANYL CITRATE PF 50 MCG/ML IJ SOSY
25.0000 ug | PREFILLED_SYRINGE | Freq: Once | INTRAMUSCULAR | Status: AC
  Administered 2023-09-28: 50 ug via INTRAVENOUS
  Filled 2023-09-28: qty 2

## 2023-09-28 MED ORDER — FENTANYL CITRATE (PF) 100 MCG/2ML IJ SOLN: INTRAMUSCULAR | Status: DC | PRN

## 2023-09-28 MED ORDER — LIDOCAINE HCL (PF) 2 % IJ SOLN
INTRAMUSCULAR | Status: AC
  Filled 2023-09-28: qty 5

## 2023-09-28 MED ORDER — LACTATED RINGERS IV SOLN: INTRAVENOUS | Status: DC

## 2023-09-28 MED ORDER — PHENYLEPHRINE HCL (PRESSORS) 10 MG/ML IV SOLN
INTRAVENOUS | Status: DC | PRN
  Administered 2023-09-28: 40 ug via INTRAVENOUS
  Administered 2023-09-28: 80 ug via INTRAVENOUS
  Administered 2023-09-28: 40 ug via INTRAVENOUS
  Administered 2023-09-28 (×2): 80 ug via INTRAVENOUS

## 2023-09-28 MED ORDER — DEXAMETHASONE SODIUM PHOSPHATE 10 MG/ML IJ SOLN
INTRAMUSCULAR | Status: AC
  Filled 2023-09-28: qty 1

## 2023-09-28 MED ORDER — ROCURONIUM BROMIDE 10 MG/ML (PF) SYRINGE
PREFILLED_SYRINGE | INTRAVENOUS | Status: AC
  Filled 2023-09-28: qty 10

## 2023-09-28 MED ORDER — STERILE WATER FOR IRRIGATION IR SOLN
Status: DC | PRN
  Administered 2023-09-28: 1000 mL

## 2023-09-28 MED ORDER — TRAMADOL HCL 50 MG PO TABS
50.0000 mg | ORAL_TABLET | Freq: Four times a day (QID) | ORAL | 0 refills | Status: DC | PRN
  Filled 2023-09-28: qty 30, 8d supply, fill #0

## 2023-09-28 MED ORDER — BUPIVACAINE-EPINEPHRINE (PF) 0.5% -1:200000 IJ SOLN
INTRAMUSCULAR | Status: DC | PRN
  Administered 2023-09-28: 15 mL via PERINEURAL

## 2023-09-28 MED ORDER — BUPIVACAINE LIPOSOME 1.3 % IJ SUSP
INTRAMUSCULAR | Status: DC | PRN
  Administered 2023-09-28: 10 mL via PERINEURAL

## 2023-09-28 MED ORDER — PROPOFOL 500 MG/50ML IV EMUL
INTRAVENOUS | Status: DC | PRN
  Administered 2023-09-28: 150 ug/kg/min via INTRAVENOUS

## 2023-09-28 MED ORDER — EPHEDRINE SULFATE (PRESSORS) 50 MG/ML IJ SOLN
INTRAMUSCULAR | Status: DC | PRN
  Administered 2023-09-28: 5 mg via INTRAVENOUS

## 2023-09-28 MED ORDER — ORAL CARE MOUTH RINSE: 15.0000 mL | Freq: Once | OROMUCOSAL | Status: AC

## 2023-09-28 MED ORDER — BUPIVACAINE-EPINEPHRINE 0.25% -1:200000 IJ SOLN
INTRAMUSCULAR | Status: AC
  Filled 2023-09-28: qty 1

## 2023-09-28 MED ORDER — DROPERIDOL 2.5 MG/ML IJ SOLN: 0.6250 mg | Freq: Once | INTRAMUSCULAR | Status: DC | PRN

## 2023-09-28 SURGICAL SUPPLY — 62 items
BAG COUNTER SPONGE SURGICOUNT (BAG) IMPLANT
BAG ZIPLOCK 12X15 (MISCELLANEOUS) IMPLANT
BIT DRILL 1.6MX128 (BIT) IMPLANT
BIT DRILL 170X2.5X (BIT) IMPLANT
BIT DRL 170X2.5X (BIT) ×1
BLADE SAG 18X100X1.27 (BLADE) ×1 IMPLANT
COVER BACK TABLE 60X90IN (DRAPES) ×1 IMPLANT
COVER SURGICAL LIGHT HANDLE (MISCELLANEOUS) ×1 IMPLANT
DRAPE INCISE IOBAN 66X45 STRL (DRAPES) ×1 IMPLANT
DRAPE SHEET LG 3/4 BI-LAMINATE (DRAPES) ×1 IMPLANT
DRAPE SURG ORHT 6 SPLT 77X108 (DRAPES) ×2 IMPLANT
DRAPE TOP 10253 STERILE (DRAPES) ×1 IMPLANT
DRAPE U-SHAPE 47X51 STRL (DRAPES) ×1 IMPLANT
DRSG ADAPTIC 3X8 NADH LF (GAUZE/BANDAGES/DRESSINGS) ×1 IMPLANT
DRSG AQUACEL AG ADV 3.5X 6 (GAUZE/BANDAGES/DRESSINGS) IMPLANT
DRSG AQUACEL AG ADV 3.5X10 (GAUZE/BANDAGES/DRESSINGS) IMPLANT
DURAPREP 26ML APPLICATOR (WOUND CARE) ×1 IMPLANT
ELECT BLADE TIP CTD 4 INCH (ELECTRODE) ×1 IMPLANT
ELECT NDL TIP 2.8 STRL (NEEDLE) ×1 IMPLANT
ELECT NEEDLE TIP 2.8 STRL (NEEDLE) ×1 IMPLANT
ELECT REM PT RETURN 15FT ADLT (MISCELLANEOUS) ×1 IMPLANT
EPI LT SZ 1 (Orthopedic Implant) ×1 IMPLANT
EPIPHYSIS LT SZ 1 (Orthopedic Implant) IMPLANT
FACESHIELD WRAPAROUND (MASK) ×1 IMPLANT
FACESHIELD WRAPAROUND OR TEAM (MASK) ×1 IMPLANT
GAUZE PAD ABD 8X10 STRL (GAUZE/BANDAGES/DRESSINGS) ×1 IMPLANT
GAUZE SPONGE 4X4 12PLY STRL (GAUZE/BANDAGES/DRESSINGS) ×1 IMPLANT
GLENOSPHERE DELTA XTEND LAT 38 (Miscellaneous) IMPLANT
GLOVE BIOGEL PI IND STRL 7.5 (GLOVE) ×1 IMPLANT
GLOVE BIOGEL PI IND STRL 8.5 (GLOVE) ×1 IMPLANT
GLOVE ORTHO TXT STRL SZ7.5 (GLOVE) ×1 IMPLANT
GLOVE SURG ORTHO 8.5 STRL (GLOVE) ×1 IMPLANT
GOWN STRL REUS W/ TWL XL LVL3 (GOWN DISPOSABLE) ×2 IMPLANT
KIT BASIN OR (CUSTOM PROCEDURE TRAY) ×1 IMPLANT
KIT TURNOVER KIT A (KITS) IMPLANT
MANIFOLD NEPTUNE II (INSTRUMENTS) ×1 IMPLANT
METAGLENE DELTA EXTEND (Trauma) IMPLANT
METAGLENE DXTEND (Trauma) ×1 IMPLANT
NDL MAYO CATGUT SZ4 TPR NDL (NEEDLE) IMPLANT
NEEDLE MAYO CATGUT SZ4 (NEEDLE) IMPLANT
NS IRRIG 1000ML POUR BTL (IV SOLUTION) ×1 IMPLANT
PACK SHOULDER (CUSTOM PROCEDURE TRAY) ×1 IMPLANT
PIN GUIDE 1.2 (PIN) IMPLANT
PIN GUIDE GLENOPHERE 1.5MX300M (PIN) IMPLANT
PIN METAGLENE 2.5 (PIN) IMPLANT
RESTRAINT HEAD UNIVERSAL NS (MISCELLANEOUS) ×1 IMPLANT
SCREW 4.5X36MM (Screw) IMPLANT
SCREW LOCK DELTA XTEND 4.5X30 (Screw) IMPLANT
SLING ARM FOAM STRAP LRG (SOFTGOODS) IMPLANT
SPACER 38 PLUS 3 (Spacer) IMPLANT
SPIKE FLUID TRANSFER (MISCELLANEOUS) ×1 IMPLANT
SPONGE T-LAP 4X18 ~~LOC~~+RFID (SPONGE) IMPLANT
STEM DELTA DIA 10 HA (Stem) IMPLANT
STRIP CLOSURE SKIN 1/2X4 (GAUZE/BANDAGES/DRESSINGS) ×1 IMPLANT
SUT FIBERWIRE #2 38 T-5 BLUE (SUTURE) ×1
SUT MNCRL AB 4-0 PS2 18 (SUTURE) ×1 IMPLANT
SUT VIC AB 0 CT1 36 (SUTURE) ×1 IMPLANT
SUT VIC AB 0 CT2 27 (SUTURE) ×1 IMPLANT
SUT VIC AB 2-0 CT1 TAPERPNT 27 (SUTURE) ×1 IMPLANT
SUTURE FIBERWR #2 38 T-5 BLUE (SUTURE) ×1 IMPLANT
TAPE CLOTH SURG 4X10 WHT LF (GAUZE/BANDAGES/DRESSINGS) IMPLANT
TOWEL OR 17X26 10 PK STRL BLUE (TOWEL DISPOSABLE) ×1 IMPLANT

## 2023-09-28 NOTE — Interval H&P Note (Signed)
History and Physical Interval Note:  09/28/2023 9:27 AM  [name]  has presented today for surgery, with the diagnosis of Left shoulder rotator cuff arthropathy.  The various methods of treatment have been discussed with the patient and family. After consideration of risks, benefits and other options for treatment, the patient has consented to  Procedure(s) with comments: REVERSE SHOULDER ARTHROPLASTY (Left) - interscalene block 120 as a surgical intervention.  The patient's history has been reviewed, patient examined, no change in status, stable for surgery.  I have reviewed the patient's chart and labs.  Questions were answered to the patient's satisfaction.     Verlee Rossetti

## 2023-09-28 NOTE — Care Plan (Signed)
Ortho Bundle Case Management Note  Patient Details  Name: [name] MRN: [id number] Date of Birth: [birth year]                  L Rev TSA on 09/28/23.  DCP: Home with husband, and daughter who lives next door. PT: HEP   DME Arranged:  N/A DME Agency:       Additional Comments: Please contact me with any questions of if this plan should need to change.    Despina Pole, CCM Case Manager, [facility]  [phone] 09/28/2023, 1:37 PM

## 2023-09-28 NOTE — Discharge Instructions (Signed)
Ice to the shoulder constantly.  Keep the incision covered and clean and dry for one week, then ok to get it wet in the shower. Please change the surgical bandage to the Aquacel bandage(silver wrapper) on Sunday or Monday. May remove that next Friday and leave uncovered at that point.  Do exercise as instructed several times per day.  DO NOT reach behind your back or push up out of a chair with the operative arm.  Use a sling while you are up and around for comfort, may remove while seated.  Keep pillow propped behind the operative elbow.  Follow up with Dr Ranell Patrick in two weeks in the office, call [phone] for appt  Please call Dr Ranell Patrick (cell) [phone] with any questions or concerns

## 2023-09-28 NOTE — Anesthesia Procedure Notes (Signed)
Anesthesia Regional Block: Interscalene brachial plexus block   Pre-Anesthetic Checklist: , timeout performed,  Correct Patient, Correct Site, Correct Laterality,  Correct Procedure, Correct Position, site marked,  Risks and benefits discussed,  Pre-op evaluation,  At surgeon's request and post-op pain management  Laterality: Left  Prep: Maximum Sterile Barrier Precautions used, chloraprep       Needles:  Injection technique: Single-shot  Needle Type: Echogenic Stimulator Needle     Needle Length: 4cm  Needle Gauge: 22     Additional Needles:   Procedures:,,,, ultrasound used (permanent image in chart),,    Narrative:  Start time: 09/28/2023 10:34 AM End time: 09/28/2023 10:37 AM Injection made incrementally with aspirations every 5 mL.  Performed by: Personally  Anesthesiologist: Kaylyn Layer, MD  Additional Notes: Risks, benefits, and alternative discussed. Patient gave consent for procedure. Patient prepped and draped in sterile fashion. Sedation administered, patient remains easily responsive to voice. Relevant anatomy identified with ultrasound guidance. Local anesthetic given in 5cc increments with no signs or symptoms of intravascular injection. No pain or paraesthesias with injection. Patient monitored throughout procedure with signs of LAST or immediate complications. Tolerated well. Ultrasound image placed in chart.  Valerie Greenhouse, MD

## 2023-09-28 NOTE — Brief Op Note (Signed)
09/28/2023  1:12 PM  PATIENT:  [name]  82 y.o. female  PRE-OPERATIVE DIAGNOSIS:  Left shoulder rotator cuff arthropathy  POST-OPERATIVE DIAGNOSIS:  Left shoulder rotator cuff arthropathy  PROCEDURE:  Procedure(s) with comments: REVERSE SHOULDER ARTHROPLASTY (Left) - interscalene block 120 DePuy Delta Xtend with no subscap repair  SURGEON:  Surgeons and Role:    Beverely Low, MD - Primary  PHYSICIAN ASSISTANT:   ASSISTANTS: Thea Gist, PA-C   ANESTHESIA:   regional and general  EBL:  100 mL   BLOOD ADMINISTERED:none  DRAINS: none   LOCAL MEDICATIONS USED:  MARCAINE     SPECIMEN:  No Specimen  DISPOSITION OF SPECIMEN:  N/A  COUNTS:  YES  TOURNIQUET:  * No tourniquets in log *  DICTATION: .Other Dictation: Dictation Number [id number]  PLAN OF CARE: Discharge to home after PACU  PATIENT DISPOSITION:  PACU - hemodynamically stable.   Delay start of Pharmacological VTE agent (>24hrs) due to surgical blood loss or risk of bleeding: not applicable

## 2023-09-28 NOTE — Anesthesia Procedure Notes (Signed)
Procedure Name: Intubation Date/Time: 09/28/2023 11:39 AM  Performed by: Ahmed Prima, CRNAPre-anesthesia Checklist: Patient identified, Emergency Drugs available, Suction available and Patient being monitored Patient Re-evaluated:Patient Re-evaluated prior to induction Oxygen Delivery Method: Circle system utilized Preoxygenation: Pre-oxygenation with 100% oxygen Induction Type: IV induction Ventilation: Mask ventilation without difficulty and Oral airway inserted - appropriate to patient size Laryngoscope Size: Glidescope, Mac and 3 Grade View: Grade I Tube type: Oral Tube size: 7.0 mm Number of attempts: 1 Airway Equipment and Method: Stylet and Oral airway Placement Confirmation: ETT inserted through vocal cords under direct vision, positive ETCO2 and breath sounds checked- equal and bilateral Secured at: 22 (at the lip) cm Tube secured with: Tape Dental Injury: Teeth and Oropharynx as per pre-operative assessment  Comments: Pt. Is an easy mask with #9 OA. First DL attempt by CRNA with Hyacinth Meeker 2, unable to obtain good view. 2nd attempt unsuccessful by Dr. Nance Pew. Successful intubation with videoscope. Pt. MBV between attempts.

## 2023-09-28 NOTE — Evaluation (Signed)
Occupational Therapy Evaluation and Discharge Patient Details Name: [name] MRN: [id number] DOB: [birth year] Today's Date: 09/28/2023   History of Present Illness Left reverse total shoulder arthroplasty   Clinical Impression   This 82 yo female admitted and underwent above presents to acute OT with all education completed and handouts provided to pt and her daughter. No further OT needs, we will sign off.       If plan is discharge home, recommend the following: A little help with walking and/or transfers;Assistance with cooking/housework;Help with stairs or ramp for entrance;Assist for transportation    Functional Status Assessment  Patient has had a recent decline in their functional status and demonstrates the ability to make significant improvements in function in a reasonable and predictable amount of time. (with all educatiion and handouts provided to pt and caregiver (dtr))  Equipment Recommendations  None recommended by OT       Precautions / Restrictions Precautions Precautions: Shoulder Type of Shoulder Precautions: Sling at all times except ADL/exercise--YES;  Non weight bearing--YES;  AROM elbow, wrist and hand to tolerance-- YES;  PROM of shoulder--No;  AROM of shoulder-- NO;  OT Consult Special Instructions--ok for gentle hand to face ADLs Shoulder Interventions: Shoulder sling/immobilizer Precaution Booklet Issued: Yes (comment) Required Braces or Orthoses: Sling Restrictions Weight Bearing Restrictions Per Provider Order: Yes LUE Weight Bearing Per Provider Order: Non weight bearing         Balance Overall balance assessment: Mild deficits observed, not formally tested (pt states it more due to just coming out of surgery--normally not an issue)                                              Vision Patient Visual Report: No change from baseline              Pertinent Vitals/Pain Pain Assessment Pain Assessment: No/denies pain  (block has not worn off)     Extremity/Trunk Assessment Upper Extremity Assessment Upper Extremity Assessment: Left hand dominant;LUE deficits/detail LUE Deficits / Details: Shoulder sx this admission; she can do move her fingers WNL and close to normal for wrist and forearm movements, no AROM at elbow as of yer           Communication Communication Communication: No apparent difficulties Cueing Techniques: Verbal cues;Visual cues;Tactile cues   Cognition Arousal: Alert Behavior During Therapy: Impulsive                                   General Comments: difficulty remembering not to move left shoulder or try to help too much with ADLs           Shoulder Instructions Shoulder Instructions Donning/doffing shirt without moving shoulder: Caregiver independent with task Method for sponge bathing under operated UE:  (pt can caregiver verbalized understanding) Donning/doffing sling/immobilizer: Caregiver independent with task Correct positioning of sling/immobilizer: Caregiver independent with task Pendulum exercises (written home exercise program):  (NA) ROM for elbow, wrist and digits of operated UE:  (pt and caregiver verbalized understanding and pt returned demonstrated hand, wrist, and forearm exercises within constraints of sling) Sling wearing schedule (on at all times/off for ADL's):  (pt and caregiver verbalized understanding) Dressing change:  (NA) Positioning of UE while sleeping:  (pt and caregiver verbalized understanding)    Home Living  Family/patient expects to be discharged to:: Private residence Living Arrangements: Spouse/significant other;Children Available Help at Discharge: Family;Available 24 hours/day Type of Home: House                                  Prior Functioning/Environment Prior Level of Function : Independent/Modified Independent                        OT Problem List: Decreased strength;Decreased range  of motion;Impaired balance (sitting and/or standing);Impaired UE functional use         OT Goals(Current goals can be found in the care plan section) Acute Rehab OT Goals Patient Stated Goal: home today and try to remember to not move my shoulder too much   AM-PAC OT "6 Clicks" Daily Activity     Outcome Measure Help from another person eating meals?: A Little Help from another person taking care of personal grooming?: A Little Help from another person toileting, which includes using toliet, bedpan, or urinal?: A Lot Help from another person bathing (including washing, rinsing, drying)?: A Lot Help from another person to put on and taking off regular upper body clothing?: A Lot Help from another person to put on and taking off regular lower body clothing?: A Lot 6 Click Score: 14   End of Session Equipment Utilized During Treatment:  (sling) Nurse Communication:  (pt ready to go from OT standpoint)  Activity Tolerance: Patient tolerated treatment well Patient left: in chair (waiting on RN to do D/C instructions, dtr wiht pt)  OT Visit Diagnosis: Unsteadiness on feet (R26.81);Muscle weakness (generalized) (M62.81);Other symptoms and signs involving cognitive function                Time: 1610-9604 OT Time Calculation (min): 35 min Charges:  OT General Charges $OT Visit: 1 Visit OT Evaluation $OT Eval Moderate Complexity: 1 Mod OT Treatments $Self Care/Home Management : 8-22 mins  Lindon Romp OT Acute Rehabilitation Services Office [phone]    Evette Georges 09/28/2023, 9:21 PM

## 2023-09-28 NOTE — Op Note (Signed)
[name], [name] MEDICAL RECORD NO: [id number] ACCOUNT NO: [id number] DATE OF BIRTH: [birth year] FACILITY: [facility] LOCATION: [facility] PHYSICIAN: Almedia Balls. Ranell Patrick, MD  Operative Report   DATE OF PROCEDURE: 09/28/2023  PREOPERATIVE DIAGNOSES:  Left shoulder rotator cuff tear arthropathy.  POSTOPERATIVE DIAGNOSES:  Left shoulder rotator cuff tear arthropathy.  PROCEDURE PERFORMED:  Left reverse total shoulder arthroplasty using DePuy Delta Extend prosthesis with no subscapularis repair.  ATTENDING SURGEON:  Almedia Balls. Ranell Patrick, MD  ASSISTANT:  Konrad Felix Dixon, [place], who was scrubbed during the entire procedure, and necessary for satisfactory completion of surgery.  ANESTHESIA:  General anesthesia plus interscalene block anesthesia was utilized.  ESTIMATED BLOOD LOSS:  100 mL.  FLUID REPLACEMENT:  1000 mL crystalloid.  COUNTS:  Instrument count was correct.  COMPLICATIONS:  There were no complications.  ANTIBIOTICS:  Perioperative antibiotics were given.  INDICATIONS:  The patient is an 82 year old female who presents with a history of worsening left shoulder pain due to a multi-tendon rotator cuff tear in the presence of osteoarthritis.  With declining function and increasing pain, we discussed options  and agreed upon reverse total shoulder arthroplasty.  Risks and benefits discussed with the patient and informed consent obtained.  DESCRIPTION OF PROCEDURE:  After an adequate level of anesthesia was achieved, the patient was positioned in the modified beach chair position.  The left shoulder was correctly identified and sterile prep and drape performed.  Timeout called verifying  correct patient and correct site.  We entered the patient's shoulder using a standard deltopectoral approach starting at the coracoid process and extending down to the anterior humerus using a 10 blade scalpel.  Dissection down through the subcutaneous  tissues using Bovie.  The cephalic vein was  identified and taken laterally with the deltoid.  The pectoralis was retracted medially.  The conjoined tendon was identified and retracted medially.  Deep retractors were placed.  We tenodesed the biceps in  situ with 0 Vicryl figure-of-eight suture x2.  We then went ahead and released the subscapularis, which was damaged off the lesser tuberosity.  This was not repairable, but we did tag it for protection of the axillary nerve.  We released the inferior  capsule extending the shoulder and delivering the humeral head out of the wound.  The patient did have significant arthritis with loss of cartilage as well as multiple tendon rotator cuff tear including supraspinatus and most of the infraspinatus.  The  teres minor was preserved.  We entered the proximal humerus with a 6-mm reamer, reaming it up to a size 10.  We then placed our 10-mm T-handle guide and resected the head at 20 degrees of retroversion with the oscillating saw.  We irrigated thoroughly.   We then removed excess bone osteophytes off the humeral neck with the rongeur.  We subluxed the humerus posteriorly.  We then went ahead and placed our deep retractors gaining good exposure of the glenoid.  We removed the capsule and the labrum, biceps,  stump, and anchor area.  We went ahead and removed the remaining cartilage off of the glenoid with the Cobb elevator.  Next, we identified our center point for a guide pin, which we centered low on the glenoid and drilled bicortical guide pin.  We then  reamed for the Metaglene baseplate down to subchondral bone.  Once we had our powered reaming done, we used a T-handled reamer to ream peripherally for the glenosphere.  Next, we drilled out our central peg hole.  We then irrigated  and impacted the HA  coated press fit base plate into position.  We placed a 36 screw at the base of the scapular neck.  We then placed a 30 screw at the base of the coracoid process.  Because of the small patient and her small AP  diameter of the glenoid, we were not able to  place anterior or posterior screws.  With the base plate secured, we selected a real 38+0 standard glenosphere and attached that to the base plate with the screwdriver.  We then went to the humeral side and reamed for the one left metaphysis.  We then  trialed with the 10 stem and the one left, set on the 0 setting and placed in 20 degrees of retroversion.  We then placed a 38+0 poly trial onto the humeral tray and reduced the shoulder.  We were pleased with our soft tissue balancing and stability.  We  removed all trial components from the humeral side.  We irrigated thoroughly.  We then used bone graft from the humeral head and with impaction grafting technique, we impacted the HA-coated Press-Fit 10 stem in the one left metaphysis set on the 0  setting and impacted in 20 degrees of retroversion.  The stem was stable and secure.  We selected the real of 38+3 poly and placed on the humeral tray and impacted that.  We reduced the shoulder.  There was a nice little pop as it reduced and the  shoulder was appropriately tight with good conjoined tensioning and no gapping with inferior pole or external rotation.  We ranged the shoulder.  No impingement was noted.  We irrigated thoroughly and then resected the remaining subscapularis that was  not repairable.  We irrigated again and then closed the deltopectoral interval with 0 Vicryl suture followed by 2-0 Vicryl for subcutaneous closure and 4-0 running Monocryl for skin.  Steri-Strips were applied followed by a sterile dressing.  The patient  tolerated the surgery well.   PUS D: 09/28/2023 1:17:39 pm T: 09/28/2023 2:19:00 pm  JOB: [id number]/ [id number]

## 2023-09-28 NOTE — Anesthesia Postprocedure Evaluation (Signed)
Anesthesia Post Note  Patient: [name]  Procedure(s) Performed: REVERSE SHOULDER ARTHROPLASTY (Left: Shoulder)     Patient location during evaluation: PACU Anesthesia Type: General Level of consciousness: awake and alert Pain management: pain level controlled Vital Signs Assessment: post-procedure vital signs reviewed and stable Respiratory status: spontaneous breathing, nonlabored ventilation and respiratory function stable Cardiovascular status: blood pressure returned to baseline Postop Assessment: no apparent nausea or vomiting Anesthetic complications: no   No notable events documented.  Last Vitals:  Vitals:   09/28/23 1400 09/28/23 1415  BP: (!) 145/76 (!) 153/76  Pulse: 81 76  Resp: 12 14  Temp:    SpO2: 94% 95%    Last Pain:  Vitals:   09/28/23 1400  TempSrc:   PainSc: 0-No pain                 [name]

## 2023-09-28 NOTE — Transfer of Care (Signed)
Immediate Anesthesia Transfer of Care Note  Patient: SHIREKA TAK  Procedure(s) Performed: REVERSE SHOULDER ARTHROPLASTY (Left: Shoulder)  Patient Location: PACU  Anesthesia Type:General  Level of Consciousness: drowsy  Airway & Oxygen Therapy: Patient Spontanous Breathing and Patient connected to face mask oxygen  Post-op Assessment: Report given to RN and Post -op Vital signs reviewed and stable  Post vital signs: Reviewed and stable  Last Vitals:  Vitals Value Taken Time  BP 159/81 09/28/23 1318  Temp    Pulse 89 09/28/23 1322  Resp 26 09/28/23 1322  SpO2 100 % 09/28/23 1322  Vitals shown include unfiled device data.  Last Pain:  Vitals:   09/28/23 1050  TempSrc:   PainSc: 0-No pain      Patients Stated Pain Goal: 4 (09/28/23 0850)  Complications: No notable events documented.

## 2023-10-01 ENCOUNTER — Encounter (HOSPITAL_COMMUNITY): Payer: Self-pay | Admitting: Orthopedic Surgery

## 2023-10-04 ENCOUNTER — Ambulatory Visit: Payer: Medicare HMO | Admitting: Internal Medicine

## 2023-10-04 ENCOUNTER — Encounter: Payer: Self-pay | Admitting: Internal Medicine

## 2023-10-04 VITALS — BP 120/64 | HR 100 | Ht 61.5 in | Wt 131.2 lb

## 2023-10-04 DIAGNOSIS — Z8711 Personal history of peptic ulcer disease: Secondary | ICD-10-CM

## 2023-10-04 MED ORDER — PANTOPRAZOLE SODIUM 40 MG PO TBEC: 40.0000 mg | DELAYED_RELEASE_TABLET | Freq: Every day | ORAL | 3 refills | Status: DC

## 2023-10-04 NOTE — Progress Notes (Signed)
HISTORY OF PRESENT ILLNESS:  [name] is a 82 y.o. female with past medical history as listed below had a history of ulcer disease who presents today for follow-up and questions regarding chronic PPI use as well as the need for medication refill.  I saw the patient January 06, 2021 for upper endoscopy to evaluate abdominal pain.  She was found to have gastric ulcers felt secondary to intermittent NSAIDs.  She was placed on PPI and underwent follow-up endoscopy March 21, 2021.  She was told to stay on PPI indefinitely to reduce the risk of ulcer recurrence.  Her dosage was decreased from 40 mg twice daily to 40 mg once daily.  She has not been seen since.  Patient tells me that she has had no recurrent problems with abdominal pain.  She did run out of her medication without untoward effects.  She recently underwent shoulder surgery.  She tells me that she avoids NSAIDs, but does take baby aspirin daily.  She is accompanied today by her husband.  No additional GI complaints.  She has aged out of screening colonoscopy having undergone multiple exams in the past, most recently 2015.  Review of blood work from September 20, 2023 shows normal hemoglobin 13.0    REVIEW OF SYSTEMS:  All non-GI ROS negative unless otherwise stated in the HPI except for excessive urination, urinary frequency, urinary leakage, hearing problems  Past Medical History:  Diagnosis Date   Back pain    Chronic constipation    past hx- no current constipation 02-2021   Hyperlipidemia    Hypertension    Multiple gastric ulcers    Osteopenia    PONV (postoperative nausea and vomiting)    Single kidney    donated kidney     Past Surgical History:  Procedure Laterality Date   CLEFT PALATE REPAIR     COLONOSCOPY     FOOT NEUROMA SURGERY  2/06   NEPHRECTOMY LIVING DONOR     Left   REVERSE SHOULDER ARTHROPLASTY Left 09/28/2023   Procedure: REVERSE SHOULDER ARTHROPLASTY;  Surgeon: Beverely Low, MD;  Location: [facility];   Service: Orthopedics;  Laterality: Left;  interscalene block 120   TUBAL LIGATION     UPPER GASTROINTESTINAL ENDOSCOPY      Social History [name]  reports that she has never smoked. She has never used smokeless tobacco. She reports that she does not drink alcohol and does not use drugs.  family history includes Colon cancer in her mother; Coronary artery disease in her father and mother; Dementia in her mother; Diabetes in her mother; Heart failure in her father; Hypertension in her father; Osteoporosis in her mother.  Allergies  Allergen Reactions   Codeine Other (See Comments)    Unknown   Hydrocodone Bit-Homatrop Mbr     Caused flu-like symptoms       PHYSICAL EXAMINATION: Vital signs: BP 120/64 (BP Location: Right Arm, Patient Position: Sitting, Cuff Size: Normal)   Pulse 100   Ht 5' 1.5" (1.562 m)   Wt 131 lb 4 oz (59.5 kg)   BMI 24.40 kg/m   Constitutional: Pleasant, generally well-appearing, no acute distress Psychiatric: alert and oriented x3, cooperative Eyes: extraocular movements intact, anicteric, conjunctiva pink Mouth: oral pharynx moist, no lesions Neck: supple no lymphadenopathy Cardiovascular: heart regular rate and rhythm, no murmur Lungs: clear to auscultation bilaterally Abdomen: soft, nontender, nondistended, no obvious ascites, no peritoneal signs, normal bowel sounds, no organomegaly Rectal: Omitted Extremities: no clubbing, cyanosis, or lower  extremity edema bilaterally.  Left arm in sling Skin: no lesions on visible extremities Neuro: No focal deficits.  Cranial nerves intact.    ASSESSMENT:  1.  History of NSAID induced ulcer disease.  Ulcers followed to healing.  Has been maintained on chronic PPI, particular given daily aspirin use. 2.  Multiple prior negative screening colonoscopies.  Most recent 2015.  Aged out. 3.  General Medical problems.  Stable   PLAN:  1.  Continue pantoprazole 40 mg daily for upper GI mucosal  protection, particularly given chronic low-dose aspirin use. 2.  Pantoprazole refilled.  Medication risk reviewed. 3.  Routine GI follow-up 2 years. 4.  Resume general medical care with PCP Total time 30 minutes was spent preparing to see the patient, reviewing records, obtaining comprehensive interval history, performing medically appropriate physical exam, counseling and educating the patient regarding above listed issues, ordering medication, defining follow-up parameters, and documenting clinical information in the health record

## 2023-10-04 NOTE — Patient Instructions (Signed)
We have sent the following medications to your pharmacy for you to pick up at your convenience:  Pantoprazole.  Please follow up in 2 years.  _______________________________________________________  If your blood pressure at your visit was 140/90 or greater, please contact your primary care physician to follow up on this.  _______________________________________________________  If you are age 81 or older, your body mass index should be between 23-30. Your Body mass index is 24.4 kg/m. If this is out of the aforementioned range listed, please consider follow up with your Primary Care Provider.  If you are age 37 or younger, your body mass index should be between 19-25. Your Body mass index is 24.4 kg/m. If this is out of the aformentioned range listed, please consider follow up with your Primary Care Provider.   ________________________________________________________  The [facility] providers would like to encourage you to use [facility] to communicate with providers for non-urgent requests or questions.  Due to long hold times on the telephone, sending your provider a message by [facility] may be a faster and more efficient way to get a response.  Please allow 48 business hours for a response.  Please remember that this is for non-urgent requests.  _______________________________________________________

## 2023-10-16 DIAGNOSIS — Z4789 Encounter for other orthopedic aftercare: Secondary | ICD-10-CM | POA: Diagnosis not present

## 2023-10-22 NOTE — Progress Notes (Signed)
NEUROLOGY FOLLOW UP OFFICE NOTE  [name] [id number]  Subjective:  [name] is a 83 y.o. year old female with a medical history of HTN, HLD, GERD, B12 deficiency, single kidney (donated other) who we last saw on 08/17/23 for right facial pain and neck pain/tightness.  To briefly review: 03/14/23: Patient was being seen at [facility] for precancer on her face. She was using fluorourcil on her face starting around 11/2022. She did this for 2 weeks. Per patient, it pulls the precancer cells out. She shows me a picture of her face after treatment with changes throughout her face. During treatment (late 12/2022) she started having an abnormal sensation on the right neck into lower right face. She can rub her right jaw and help. It is happening 2-3 times per day, lasting a few seconds to 1 minute. It feels tingling, like something is crawling up her face. It is not a sharp or shooting pain that does not hurt. She has no lacrimation, ptosis, facial swelling, face drooping, or rhinorrhea. She denies neck pain or muscle tightness. She denies any triggers, including touching her face, brushing her teeth, or chewing.   She denies significant headaches or vision changes. She denies tingling or numbness in arms or legs.   She did do physical therapy for back and leg pain in 10/2022.   Patient takes B12 and vit D. She denies taking B complex.  08/17/23: Patient's right face paresthesia has become more painful over the last couple of months. She describes a lightening bolt shooting sensation in right temple that is very quick (a few seconds) and relieved by pressing on it. She will also have several in a row, then go days without any symptoms. She estimates she has symptoms about 2 days a weeks. She denies any clear triggers such as chewing, brushing her teeth, or touching her face. It is never on the left side.    She also has changes to the sensation of her ear, like there is air in it.  She denies vision changes or jaw pain. Hearing has not changed. Patient mentioned this to her PCP who ordered CRP and ESR to evaluate for temporal arteritis. There were normal.   Trigeminal nerve protocol MRI was also ordered and is scheduled for 09/25/23.   She also endorses memory difficulties. She does not remember details like appointments or obligations unless she writes it down. She denies any difficulty with ADLs. She denies getting lost when driving or walking in familiar areas. She endorses occasional difficulty sleeping due to left shoulder pain. She typically sleeps 6-7 hours. She feels refreshed in the morning. She has not been told she snores. She does take daytime naps. In terms of mood, she denies significant depressive symptoms.  Most recent Assessment and Plan (08/17/23): This is [name], a 83 y.o. female with: Right facial pain - sharp, electric, lasting a few seconds in V1 > V2 and V3. May be consistent with trigeminal neuralgia, but is without the usual features of being triggered by touch and may actually be relieved by pressure. She has no jaw claudication, vision changes, or temporal pain to palpation and ESR and CRP are normal, making temporal arteritis unlikely. Right facial paresthesias - itching and crawling sensation in right face. May be related to #1 above vs #3 below Neck pain/tightness Memory changes - mild memory difficulties. Will monitor.   Plan: -PT for neck pain/tightness (patient is currently going to PT for [facility], will send  there and see if they can add on) -Discussed medication, such as carbamazepine, but patient defers for now until MRI and PT. She will call if she changes her mind. -Follow up on trigeminal nerve protocol MRI (09/25/23)  Since their last visit: She is no longer having shock sensation on her face. It is very rare that she has mild tingling. MRI was rescheduled and now on 11/05/23.   She is doing PT for her shoulder. She still  has some neck stiffness/tightness.  She still has some mild memory issues, but keeping notes and a calendar helps keep her on track. She takes care of her husband who has significant memory issues.  MEDICATIONS:  Outpatient Encounter Medications as of 11/01/2023  Medication Sig   aspirin EC 81 MG tablet Take 81 mg by mouth daily. Swallow whole.   atorvastatin (LIPITOR) 10 MG tablet TAKE 1 TABLET BY MOUTH EVERY DAY   Cyanocobalamin (B-12) 1000 MCG CAPS Take 1,000 capsules by mouth daily.   D 1000 25 MCG (1000 UT) capsule Take 1,000 Units by mouth daily.   lisinopril (ZESTRIL) 10 MG tablet Take 10 mg by mouth daily.   pantoprazole (PROTONIX) 40 MG tablet Take 1 tablet (40 mg total) by mouth 2 (two) times daily. Office visit for further refills   pantoprazole (PROTONIX) 40 MG tablet Take 1 tablet (40 mg total) by mouth daily.   traMADol (ULTRAM) 50 MG tablet Take 1 tablet (50 mg total) by mouth every 6 (six) hours as needed for moderate pain (pain score 4-6) or severe pain (pain score 7-10).   No facility-administered encounter medications on file as of 11/01/2023.    PAST MEDICAL HISTORY: Past Medical History:  Diagnosis Date   Back pain    Chronic constipation    past hx- no current constipation 02-2021   Hyperlipidemia    Hypertension    Multiple gastric ulcers    Osteopenia    PONV (postoperative nausea and vomiting)    Single kidney    donated kidney     PAST SURGICAL HISTORY: Past Surgical History:  Procedure Laterality Date   CLEFT PALATE REPAIR     COLONOSCOPY     FOOT NEUROMA SURGERY  2/06   NEPHRECTOMY LIVING DONOR     Left   REVERSE SHOULDER ARTHROPLASTY Left 09/28/2023   Procedure: REVERSE SHOULDER ARTHROPLASTY;  Surgeon: Beverely Low, MD;  Location: [facility];  Service: Orthopedics;  Laterality: Left;  interscalene block 120   TUBAL LIGATION     UPPER GASTROINTESTINAL ENDOSCOPY      ALLERGIES: Allergies  Allergen Reactions   Codeine Other (See Comments)     Unknown   Hydrocodone Bit-Homatrop Mbr     Caused flu-like symptoms    FAMILY HISTORY: Family History  Problem Relation Age of Onset   Colon cancer Mother    Diabetes Mother    Coronary artery disease Mother    Osteoporosis Mother    Dementia Mother    Coronary artery disease Father    Heart failure Father    Hypertension Father    Stomach cancer Neg Hx    Pancreatic cancer Neg Hx    Esophageal cancer Neg Hx    Liver disease Neg Hx    Colon polyps Neg Hx    Rectal cancer Neg Hx    Breast cancer Neg Hx     SOCIAL HISTORY: Social History   Tobacco Use   Smoking status: Never   Smokeless tobacco: Never  Vaping Use   Vaping  status: Never Used  Substance Use Topics   Alcohol use: No   Drug use: No   Social History   Social History Narrative   Left handed    Are you right handed or left handed?    Are you currently employed ?    What is your current occupation? retired   Do you live at home alone?   Who lives with you? husband   What type of home do you live in: 1 story or 2 story? Two     Caffeine none      Objective:  Vital Signs:  BP 136/80   Pulse 90   Ht 5' 1.5" (1.562 m)   Wt 130 lb (59 kg)   SpO2 100%   BMI 24.17 kg/m   General: No acute distress.  Patient appears well-groomed.   Head:  Normocephalic/atraumatic Neck: supple, positive for paraspinal tenderness, reduced range of motion Lungs:  Non-labored breathing on room air  Neurological Exam: alert and oriented.  Speech fluent and not dysarthric, language intact.  CN II-XII intact. Bulk and tone normal, muscle strength 5/5 throughout.  Sensation to light touch intact.  Deep tendon reflexes 2+ throughout, toes downgoing.  Finger to nose testing intact.  Gait normal.  Labs and Imaging review: New results: 09/20/23: BMP significant for glucose of 116 CBC significant for MCV of 97.2  Left shoulder xray (09/28/23): FINDINGS: Status post left shoulder reverse arthroplasty with  expected overlying postoperative change. No perihardware fracture or component malpositioning. No acute abnormality of the included chest.   IMPRESSION: Status post left shoulder reverse arthroplasty with expected overlying postoperative change. No perihardware fracture or component malpositioning.  Previously reviewed results: 08/07/23: CRP < 1.0 ESR 12 CBC w/ diff unremarkable (MCV 92)   BMP (06/19/23) unremarkable   05/28/23: TSH wnl HbA1c: 6.0 B12: 828 Vit D wnl        Lab Results  Component Value Date    HGBA1C 6.1 04/18/2022      Recent Labs           Lab Results  Component Value Date    VITAMINB12 826 01/19/2023      Recent Labs           Lab Results  Component Value Date    TSH 2.33 04/18/2022      Recent Labs[] Expand by Default           Lab Results  Component Value Date    ESRSEDRATE 4 01/19/2023      CBC and BMP (01/19/23) unremarkable   Imaging: MRI brain/MRA head w/wo contrast (01/30/23): FINDINGS: MRI HEAD FINDINGS   A routine protocol brain MRI (without and with contrast) was ordered and performed.   Brain:   Mild generalized cerebral atrophy.   Multifocal T2 FLAIR hyperintense signal abnormality within the cerebral white matter, nonspecific but compatible with mild chronic small vessel ischemic disease.   There is no acute infarct.   No evidence of an intracranial mass.   No convincing chronic intracranial blood products.   No extra-axial fluid collection.   No midline shift.   No pathologic intracranial enhancement identified.   Vascular: Maintained flow voids within the proximal large arterial vessels.   Skull and upper cervical spine: No focal suspicious marrow lesion. Incompletely assessed cervical spondylosis.   Sinuses/Orbits: No mass or acute finding within the imaged orbits. Small mucous retention cyst within the right maxillary sinus.   Other: Small-volume fluid within the bilateral mastoid air cells.  MRA HEAD FINDINGS   Anterior circulation:   The intracranial internal carotid arteries are patent. The M1 middle cerebral arteries are patent. No M2 proximal branch occlusion or high-grade proximal stenosis. The anterior cerebral arteries are patent. Hypoplastic right A1 segment 1-2 mm laterally projecting vascular protrusion arising from the cavernous right ICA which may reflect an aneurysm or the origin of an otherwise poorly delineated branch vessel (for instance as seen on series 5, image 58). 2 mm inferiorly projecting vascular protrusion arising from the distal cavernous left ICA compatible with an aneurysm (for instance as seen on series 5, image 63) (series 107, image 134).   Posterior circulation:   The visualized intracranial vertebral arteries are patent. The basilar artery is patent. The posterior cerebral arteries are patent. Posterior communicating arteries are diminutive or absent, bilaterally.   Anatomic variants: As described.   IMPRESSION: MRI brain: 1. No evidence of an acute intracranial abnormality. 2. No specific cause of facial pain/paresthesia is identified. If symptoms persist, a trigeminal nerve protocol MRI of the face (without and with contrast) may be obtained for further evaluation. 3. Mild chronic small vessel ischemic changes within the cerebral white matter. 4. Mild generalized cerebral atrophy. 5. Small mucous retention cyst within the right maxillary sinus. 6. Small-volume fluid within the bilateral mastoid air cells.   MRA head: 1. No specific arterial cause of the reported symptoms is identified. 2. No intracranial large vessel occlusion or proximal high-grade arterial stenosis. 3. 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 4. 1-2 mm laterally projecting vascular protrusion arising from the cavernous right ICA, which may reflect an aneurysm or the origin of an otherwise poorly delineated branch  vessel.  Assessment/Plan:  This is [name], a 83 y.o. female with: Right facial pain - sharp, electric, lasting a few seconds in V1 > V2 and V3. May be consistent with trigeminal neuralgia, but is without the usual features of being triggered by touch and may actually be relieved by pressure. She has no jaw claudication, vision changes, or temporal pain to palpation and ESR and CRP are normal, making temporal arteritis unlikely. Fortunately, symptoms have resolved with little to no symptoms since last visit. Neck pain/tightness Memory changes - mild memory difficulties. Will monitor.   Plan: -Okay with patient cancelling MRI trigeminal nerve protocol on 11/05/23 as she is having no symptoms -Continue to monitor face pain off of medications which we have previously discussed -Patient to call with new or worsening symptoms  Return to clinic as needed   Jacquelyne Balint, MD

## 2023-10-30 DIAGNOSIS — M25512 Pain in left shoulder: Secondary | ICD-10-CM | POA: Diagnosis not present

## 2023-11-01 ENCOUNTER — Ambulatory Visit (INDEPENDENT_AMBULATORY_CARE_PROVIDER_SITE_OTHER): Payer: Medicare HMO | Admitting: Neurology

## 2023-11-01 ENCOUNTER — Encounter: Payer: Self-pay | Admitting: Neurology

## 2023-11-01 VITALS — BP 136/80 | HR 90 | Ht 61.5 in | Wt 130.0 lb

## 2023-11-01 DIAGNOSIS — R519 Headache, unspecified: Secondary | ICD-10-CM | POA: Diagnosis not present

## 2023-11-01 DIAGNOSIS — M542 Cervicalgia: Secondary | ICD-10-CM | POA: Diagnosis not present

## 2023-11-01 DIAGNOSIS — G500 Trigeminal neuralgia: Secondary | ICD-10-CM | POA: Diagnosis not present

## 2023-11-01 DIAGNOSIS — R202 Paresthesia of skin: Secondary | ICD-10-CM | POA: Diagnosis not present

## 2023-11-01 DIAGNOSIS — R200 Anesthesia of skin: Secondary | ICD-10-CM | POA: Diagnosis not present

## 2023-11-01 NOTE — Patient Instructions (Signed)
Fortunately you are not having the face pain anymore.  As such, if you want to cancel your upcoming MRI, that would be fine.  If you have return of your face pain, please let me know.  Follow up with me as needed.  The physicians and staff at [facility] are committed to providing excellent care. You may receive a survey requesting feedback about your experience at our office. We strive to receive "very good" responses to the survey questions. If you feel that your experience would prevent you from giving the office a "very good " response, please contact our office to try to remedy the situation. We may be reached at [phone]. Thank you for taking the time out of your busy day to complete the survey.  Jacquelyne Balint, MD [facility]

## 2023-11-01 NOTE — Addendum Note (Signed)
Addended by: Antony Madura on: 11/01/2023 12:28 PM   Modules accepted: Level of Service

## 2023-11-05 ENCOUNTER — Other Ambulatory Visit: Payer: Medicare HMO

## 2023-11-05 DIAGNOSIS — M25512 Pain in left shoulder: Secondary | ICD-10-CM | POA: Diagnosis not present

## 2023-11-07 DIAGNOSIS — M25512 Pain in left shoulder: Secondary | ICD-10-CM | POA: Diagnosis not present

## 2023-11-14 DIAGNOSIS — M25512 Pain in left shoulder: Secondary | ICD-10-CM | POA: Diagnosis not present

## 2023-11-15 ENCOUNTER — Telehealth: Payer: Self-pay | Admitting: Family Medicine

## 2023-11-15 DIAGNOSIS — Z4789 Encounter for other orthopedic aftercare: Secondary | ICD-10-CM | POA: Diagnosis not present

## 2023-11-15 NOTE — Telephone Encounter (Signed)
 Copied from CRM [id number]. Topic: General - Other >> Nov 15, 2023 12:31 PM Marlan Silva wrote: Reason for CRM: Patient is requesting a call back from [place].

## 2023-11-15 NOTE — Telephone Encounter (Signed)
 Pt was trying to reach [facility], CMA, that works with Dr. Joelle Musca, which is her husbands PCP. She was calling concerning a medication her husband has been prescribed. Further documentation in pt's chart, Joylene Noe.   Sent msg in pt's chart to his PCP.

## 2023-11-20 DIAGNOSIS — Z87891 Personal history of nicotine dependence: Secondary | ICD-10-CM | POA: Diagnosis not present

## 2023-11-20 DIAGNOSIS — E785 Hyperlipidemia, unspecified: Secondary | ICD-10-CM | POA: Diagnosis not present

## 2023-11-20 DIAGNOSIS — K5900 Constipation, unspecified: Secondary | ICD-10-CM | POA: Diagnosis not present

## 2023-11-20 DIAGNOSIS — K219 Gastro-esophageal reflux disease without esophagitis: Secondary | ICD-10-CM | POA: Diagnosis not present

## 2023-11-20 DIAGNOSIS — M810 Age-related osteoporosis without current pathological fracture: Secondary | ICD-10-CM | POA: Diagnosis not present

## 2023-11-20 DIAGNOSIS — M1990 Unspecified osteoarthritis, unspecified site: Secondary | ICD-10-CM | POA: Diagnosis not present

## 2023-11-20 DIAGNOSIS — Z823 Family history of stroke: Secondary | ICD-10-CM | POA: Diagnosis not present

## 2023-11-20 DIAGNOSIS — I10 Essential (primary) hypertension: Secondary | ICD-10-CM | POA: Diagnosis not present

## 2023-11-20 DIAGNOSIS — R32 Unspecified urinary incontinence: Secondary | ICD-10-CM | POA: Diagnosis not present

## 2023-11-20 DIAGNOSIS — Z8249 Family history of ischemic heart disease and other diseases of the circulatory system: Secondary | ICD-10-CM | POA: Diagnosis not present

## 2023-11-20 DIAGNOSIS — I700 Atherosclerosis of aorta: Secondary | ICD-10-CM | POA: Diagnosis not present

## 2023-11-20 DIAGNOSIS — Z833 Family history of diabetes mellitus: Secondary | ICD-10-CM | POA: Diagnosis not present

## 2023-11-22 DIAGNOSIS — M25512 Pain in left shoulder: Secondary | ICD-10-CM | POA: Diagnosis not present

## 2023-12-05 DIAGNOSIS — M25512 Pain in left shoulder: Secondary | ICD-10-CM | POA: Diagnosis not present

## 2023-12-11 DIAGNOSIS — M25512 Pain in left shoulder: Secondary | ICD-10-CM | POA: Diagnosis not present

## 2023-12-19 ENCOUNTER — Ambulatory Visit: Payer: Medicare HMO | Admitting: Family Medicine

## 2023-12-20 ENCOUNTER — Encounter: Payer: Self-pay | Admitting: Family Medicine

## 2023-12-20 ENCOUNTER — Ambulatory Visit (INDEPENDENT_AMBULATORY_CARE_PROVIDER_SITE_OTHER)

## 2023-12-20 ENCOUNTER — Ambulatory Visit (INDEPENDENT_AMBULATORY_CARE_PROVIDER_SITE_OTHER): Payer: Medicare HMO | Admitting: Family Medicine

## 2023-12-20 VITALS — BP 122/65 | HR 76 | Temp 99.2°F | Ht 61.5 in | Wt 131.2 lb

## 2023-12-20 DIAGNOSIS — E875 Hyperkalemia: Secondary | ICD-10-CM

## 2023-12-20 DIAGNOSIS — E538 Deficiency of other specified B group vitamins: Secondary | ICD-10-CM

## 2023-12-20 DIAGNOSIS — R944 Abnormal results of kidney function studies: Secondary | ICD-10-CM

## 2023-12-20 DIAGNOSIS — Z79899 Other long term (current) drug therapy: Secondary | ICD-10-CM

## 2023-12-20 DIAGNOSIS — E7800 Pure hypercholesterolemia, unspecified: Secondary | ICD-10-CM

## 2023-12-20 DIAGNOSIS — Z1231 Encounter for screening mammogram for malignant neoplasm of breast: Secondary | ICD-10-CM

## 2023-12-20 DIAGNOSIS — I10 Essential (primary) hypertension: Secondary | ICD-10-CM | POA: Diagnosis not present

## 2023-12-20 DIAGNOSIS — R7303 Prediabetes: Secondary | ICD-10-CM

## 2023-12-20 LAB — BASIC METABOLIC PANEL
BUN: 18 mg/dL (ref 6–23)
CO2: 26 meq/L (ref 19–32)
Calcium: 9.4 mg/dL (ref 8.4–10.5)
Chloride: 102 meq/L (ref 96–112)
Creatinine, Ser: 1.14 mg/dL (ref 0.40–1.20)
GFR: 45.19 mL/min — ABNORMAL LOW (ref >60.00)
Glucose, Bld: 110 mg/dL — ABNORMAL HIGH (ref 70–99)
Potassium: 4.2 meq/L (ref 3.5–5.1)
Sodium: 136 meq/L (ref 135–145)

## 2023-12-20 LAB — VITAMIN B12: Vitamin B-12: 854 pg/mL (ref 211–911)

## 2023-12-20 NOTE — Assessment & Plan Note (Signed)
 A1c today  disc imp of low glycemic diet and wt loss to prevent DM2

## 2023-12-20 NOTE — Assessment & Plan Note (Signed)
 bp in fair control at this time  BP Readings from Last 1 Encounters:  12/20/23 122/65   No changes needed Most recent labs reviewed  Disc lifstyle change with low sodium diet and exercise  On lisinopril 10  Lab today for bmet/ past history of high K

## 2023-12-20 NOTE — Patient Instructions (Addendum)
 Try and drink more water   Finish your PT  When released  Add some strength training to your routine, this is important for bone and brain health and can reduce your risk of falls and help your body use insulin properly and regulate weight  Light weights, exercise bands , and internet videos are a good way to start  Yoga (chair or regular), machines , floor exercises or a gym with machines are also good options    Lab today for chem levels and A1c  Try to get most of your carbohydrates from produce (with the exception of white potatoes) and whole grains Eat less bread/pasta/rice/snack foods/cereals/sweets and other items from the middle of the grocery store (processed carbs)   You have an order for:  []   2D Mammogram  [x]   3D Mammogram  []   Bone Density     Please call for appointment:   []   [facility]  [address] [place] [address]  [phone]  []   [facility])   [address]  [place], [place] [address]  [phone]  [x]   [facility]      [address] [place], [place]        [phone]         []   [facility]  [address] [place], [place]  [phone]  []  [facility]   [address]   [place], [place] [address]  [phone]  []  [facility]  [address] [place], [place] [address] [phone]    Make sure to wear two piece clothing  No lotions powders or deodorants the day of the appointment Make sure to bring picture ID and insurance card.  Bring list of medications you are currently taking including any supplements.   Schedule your screening mammogram through MyChart!   Select [place] imaging sites can now be scheduled through MyChart.  Log into your MyChart account.  Go to 'Visit'  (or 'Appointments' if  on mobile App) --> Schedule an  Appointment  Under 'Select a Reason for Visit' choose the Mammogram  Screening option.  Complete the pre-visit questions  and select the time and place that  best fits your schedule

## 2023-12-20 NOTE — Assessment & Plan Note (Signed)
 B12 added to labs for this and memory complaints

## 2023-12-20 NOTE — Assessment & Plan Note (Signed)
Mammogram ordered. Pt will schedule

## 2023-12-20 NOTE — Addendum Note (Signed)
 Addended by: Alvina Chou on: 12/20/2023 02:24 PM   Modules accepted: Orders

## 2023-12-20 NOTE — Assessment & Plan Note (Signed)
 Lipids are up a bit  LDL of 107  Disc goals for lipids and reasons to control them Rev last labs with pt Rev low sat fat diet in detail Will continue atorvastatin 10 mg daily

## 2023-12-20 NOTE — Assessment & Plan Note (Signed)
 Last gfr 48.3 Her lisinopril was increased back to 10  Watching K and bmet -labs today  Blood pressure is controlled

## 2023-12-20 NOTE — Progress Notes (Signed)
 Subjective:    Patient ID: [name], female    DOB: 25-Jul-1941, 83 y.o.   MRN: [id number]  HPI  Wt Readings from Last 3 Encounters:  12/20/23 131 lb 4 oz (59.5 kg)  11/01/23 130 lb (59 kg)  10/04/23 131 lb 4 oz (59.5 kg)   24.40 kg/m  Vitals:   12/20/23 1009 12/20/23 1033  BP: 132/80 122/65  Pulse: 76   Temp: 99.2 F (37.3 C)   SpO2: 96%    Pt presents for follow up of chronic health problems  Had shoulder replacement -big surgery and doing fine after falling on left shoulder  Last PT appointment next week  Rom is still limited    HTN bp is stable today  No cp or palpitations or headaches or edema  No side effects to medicines  BP Readings from Last 3 Encounters:  12/20/23 122/65  11/01/23 136/80  10/04/23 120/64    Lisinopril 10 mg daily   Lab Results  Component Value Date   NA 136 09/20/2023   K 4.7 09/20/2023   CO2 25 09/20/2023   GLUCOSE 116 (H) 09/20/2023   BUN 14 09/20/2023   CREATININE 0.96 09/20/2023   CALCIUM 9.4 09/20/2023   GFR 48.39 (L) 06/19/2023   GFRNONAA 59 (L) 09/20/2023   Watching GFR  Is drinking fluids but too much diet coke  Some water    History of PUD Taking protonix 40 mg daily  Sees GI    Osteoporosis Discussed prolia Cannot have oral bisph due to GI issues One fall/ no fractures    Hyperlipidemia Lab Results  Component Value Date   CHOL 181 05/28/2023   HDL 45.60 05/28/2023   LDLCALC 107 (H) 05/28/2023   TRIG 140.0 05/28/2023   CHOLHDL 4 05/28/2023   Atorvastatin 10 mg daily   Prediabetes Lab Results  Component Value Date   HGBA1C 6.0 05/28/2023   HGBA1C 6.1 04/18/2022   HGBA1C 5.9 08/10/2021   Sugar intake is ok  Drinks diet drinks  Artificial sweeteners   Not a lot of sweets or sugar   Notes decrease in short term memory -watching this     Patient Active Problem List   Diagnosis Date Noted   Pre-operative clearance 08/13/2023   Right facial pain 08/07/2023   Facial paresthesia  01/19/2023   Right groin pain 10/10/2022   Current use of proton pump inhibitor 04/18/2022   Decreased GFR 04/18/2022   Vitamin B12 deficiency 04/18/2022   Multiple gastric ulcers 04/27/2021   Effusion of right knee joint 05/31/2020   Pain in right knee 05/31/2020   Pain in joint of left shoulder 05/20/2020   Pain in right foot 04/08/2020   Closed fracture of shaft of right fibula 01/08/2020   Pain in joint of right ankle 01/08/2020   Hematoma of left breast 01/02/2020   Carpal tunnel syndrome 05/27/2018   Pain in joint of right foot 04/24/2018   Hyperkalemia 02/01/2018   Encounter for screening mammogram for breast cancer 01/19/2017   Estrogen deficiency 01/19/2017   Routine general medical examination at a health care facility 06/11/2015   Family history of colon cancer 11/14/2013   Colon cancer screening 11/14/2013   Hypertension 01/27/2011   Osteoporosis 01/27/2011   Low back pain radiating to left leg 03/13/2008   Hyperlipidemia 12/20/2007   CONSTIPATION, CHRONIC 12/20/2007   Prediabetes 12/20/2007   Past Medical History:  Diagnosis Date   Back pain    Chronic constipation    past hx-  no current constipation 02-2021   Hyperlipidemia    Hypertension    Multiple gastric ulcers    Osteopenia    PONV (postoperative nausea and vomiting)    Single kidney    donated kidney    Past Surgical History:  Procedure Laterality Date   CLEFT PALATE REPAIR     COLONOSCOPY     FOOT NEUROMA SURGERY  2/06   NEPHRECTOMY LIVING DONOR     Left   REVERSE SHOULDER ARTHROPLASTY Left 09/28/2023   Procedure: REVERSE SHOULDER ARTHROPLASTY;  Surgeon: Beverely Low, MD;  Location: [facility];  Service: Orthopedics;  Laterality: Left;  interscalene block 120   TUBAL LIGATION     UPPER GASTROINTESTINAL ENDOSCOPY     Social History   Tobacco Use   Smoking status: Never   Smokeless tobacco: Never  Vaping Use   Vaping status: Never Used  Substance Use Topics   Alcohol use: No   Drug use:  No   Family History  Problem Relation Age of Onset   Colon cancer Mother    Diabetes Mother    Coronary artery disease Mother    Osteoporosis Mother    Dementia Mother    Coronary artery disease Father    Heart failure Father    Hypertension Father    Stomach cancer Neg Hx    Pancreatic cancer Neg Hx    Esophageal cancer Neg Hx    Liver disease Neg Hx    Colon polyps Neg Hx    Rectal cancer Neg Hx    Breast cancer Neg Hx    Allergies  Allergen Reactions   Codeine Other (See Comments)    Unknown   Hydrocodone Bit-Homatrop Mbr     Caused flu-like symptoms   Current Outpatient Medications on File Prior to Visit  Medication Sig Dispense Refill   aspirin EC 81 MG tablet Take 81 mg by mouth daily. Swallow whole.     atorvastatin (LIPITOR) 10 MG tablet TAKE 1 TABLET BY MOUTH EVERY DAY 90 tablet 1   Cyanocobalamin (B-12) 1000 MCG CAPS Take 1,000 capsules by mouth daily.     D 1000 25 MCG (1000 UT) capsule Take 1,000 Units by mouth daily.     lisinopril (ZESTRIL) 10 MG tablet Take 10 mg by mouth daily.     pantoprazole (PROTONIX) 40 MG tablet Take 1 tablet (40 mg total) by mouth daily. 90 tablet 3   traMADol (ULTRAM) 50 MG tablet Take 1 tablet (50 mg total) by mouth every 6 (six) hours as needed for moderate pain (pain score 4-6) or severe pain (pain score 7-10). 30 tablet 0   No current facility-administered medications on file prior to visit.    Review of Systems  Constitutional:  Negative for activity change, appetite change, fatigue, fever and unexpected weight change.  HENT:  Negative for congestion, ear pain, rhinorrhea, sinus pressure and sore throat.   Eyes:  Negative for pain, redness and visual disturbance.  Respiratory:  Negative for cough, shortness of breath and wheezing.   Cardiovascular:  Negative for chest pain and palpitations.  Gastrointestinal:  Negative for abdominal pain, blood in stool, constipation and diarrhea.  Endocrine: Negative for polydipsia and  polyuria.  Genitourinary:  Negative for dysuria, frequency and urgency.  Musculoskeletal:  Positive for arthralgias. Negative for back pain and myalgias.  Skin:  Negative for pallor and rash.  Allergic/Immunologic: Negative for environmental allergies.  Neurological:  Negative for dizziness, syncope and headaches.  Hematological:  Negative for adenopathy. Does not  bruise/bleed easily.  Psychiatric/Behavioral:  Negative for decreased concentration and dysphoric mood. The patient is not nervous/anxious.        Objective:   Physical Exam Constitutional:      General: She is not in acute distress.    Appearance: Normal appearance. She is well-developed and normal weight. She is not ill-appearing or diaphoretic.  HENT:     Head: Normocephalic and atraumatic.  Eyes:     Conjunctiva/sclera: Conjunctivae normal.     Pupils: Pupils are equal, round, and reactive to light.  Neck:     Thyroid: No thyromegaly.     Vascular: No carotid bruit or JVD.  Cardiovascular:     Rate and Rhythm: Normal rate and regular rhythm.     Heart sounds: Normal heart sounds.     No gallop.  Pulmonary:     Effort: Pulmonary effort is normal. No respiratory distress.     Breath sounds: Normal breath sounds. No stridor. No wheezing, rhonchi or rales.  Abdominal:     General: There is no distension or abdominal bruit.     Palpations: Abdomen is soft.  Musculoskeletal:     Cervical back: Normal range of motion and neck supple.     Right lower leg: No edema.     Left lower leg: No edema.  Lymphadenopathy:     Cervical: No cervical adenopathy.  Skin:    General: Skin is warm and dry.     Coloration: Skin is not pale.     Findings: No rash.  Neurological:     Mental Status: She is alert.     Coordination: Coordination normal.     Deep Tendon Reflexes: Reflexes are normal and symmetric. Reflexes normal.  Psychiatric:        Attention and Perception: Attention normal.        Mood and Affect: Mood normal.         Cognition and Memory: Cognition and memory normal.     Comments: Candidly discusses symptoms and stressors    Very mentally sharp            Assessment & Plan:   Problem List Items Addressed This Visit       Cardiovascular and Mediastinum   Hypertension - Primary   bp in fair control at this time  BP Readings from Last 1 Encounters:  12/20/23 122/65   No changes needed Most recent labs reviewed  Disc lifstyle change with low sodium diet and exercise  On lisinopril 10  Lab today for bmet/ past history of high K        Other   Vitamin B12 deficiency   B12 today  Pt notes mild changes in short term memory         Relevant Orders   Vitamin B12   Prediabetes   A1c today  disc imp of low glycemic diet and wt loss to prevent DM2       Hyperlipidemia   Lipids are up a bit  LDL of 107  Disc goals for lipids and reasons to control them Rev last labs with pt Rev low sat fat diet in detail Will continue atorvastatin 10 mg daily         Hyperkalemia   With lisinopril in past  We dec to 5  Then was increased back to 10   Lab today      Encounter for screening mammogram for breast cancer   Mammogram ordered  Pt will schedule  Relevant Orders   MM 3D SCREENING MAMMOGRAM BILATERAL BREAST   Decreased GFR   Last gfr 48.3 Her lisinopril was increased back to 10  Watching K and bmet -labs today  Blood pressure is controlled       Current use of proton pump inhibitor   B12 added to labs for this and memory complaints       Relevant Orders   Vitamin B12

## 2023-12-20 NOTE — Assessment & Plan Note (Signed)
 B12 today  Pt notes mild changes in short term memory

## 2023-12-20 NOTE — Assessment & Plan Note (Signed)
 With lisinopril in past  We dec to 5  Then was increased back to 10   Lab today

## 2023-12-25 DIAGNOSIS — M25512 Pain in left shoulder: Secondary | ICD-10-CM | POA: Diagnosis not present

## 2023-12-31 ENCOUNTER — Ambulatory Visit
Admission: RE | Admit: 2023-12-31 | Discharge: 2023-12-31 | Disposition: A | Discharge status: Home / Self Care | Source: Ambulatory Visit | Attending: Family Medicine | Admitting: Family Medicine

## 2023-12-31 DIAGNOSIS — Z1231 Encounter for screening mammogram for malignant neoplasm of breast: Secondary | ICD-10-CM | POA: Diagnosis not present

## 2024-01-02 ENCOUNTER — Encounter: Payer: Self-pay | Admitting: Family Medicine

## 2024-01-02 ENCOUNTER — Other Ambulatory Visit: Payer: Self-pay | Admitting: Family Medicine

## 2024-01-03 DIAGNOSIS — L814 Other melanin hyperpigmentation: Secondary | ICD-10-CM | POA: Diagnosis not present

## 2024-01-03 DIAGNOSIS — D485 Neoplasm of uncertain behavior of skin: Secondary | ICD-10-CM | POA: Diagnosis not present

## 2024-01-03 DIAGNOSIS — L821 Other seborrheic keratosis: Secondary | ICD-10-CM | POA: Diagnosis not present

## 2024-01-03 DIAGNOSIS — D225 Melanocytic nevi of trunk: Secondary | ICD-10-CM | POA: Diagnosis not present

## 2024-02-05 DIAGNOSIS — Z113 Encounter for screening for infections with a predominantly sexual mode of transmission: Secondary | ICD-10-CM | POA: Diagnosis not present

## 2024-03-07 DIAGNOSIS — R700 Elevated erythrocyte sedimentation rate: Secondary | ICD-10-CM | POA: Diagnosis not present

## 2024-03-28 ENCOUNTER — Ambulatory Visit

## 2024-03-28 VITALS — BP 120/72 | Ht 61.5 in | Wt 132.8 lb

## 2024-03-28 DIAGNOSIS — Z0000 Encounter for general adult medical examination without abnormal findings: Secondary | ICD-10-CM

## 2024-03-28 NOTE — Progress Notes (Signed)
 Subjective:   [name] is a 83 y.o. who presents for a Medicare Wellness preventive visit.  As a reminder, Annual Wellness Visits don't include a physical exam, and some assessments may be limited, especially if this visit is performed virtually. We may recommend an in-person follow-up visit with your provider if needed.  Visit Complete: In person  Persons Participating in Visit: Patient.  AWV Questionnaire: No: Patient Medicare AWV questionnaire was not completed prior to this visit.  Cardiac Risk Factors include: advanced age (>26men, >51 women);dyslipidemia;hypertension     Objective:    Today's Vitals   03/28/24 1310  BP: 120/72  Weight: 132 lb 12.8 oz (60.2 kg)  Height: 5' 1.5 (1.562 m)   Body mass index is 24.69 kg/m.     03/28/2024    1:27 PM 11/01/2023    8:30 AM 09/20/2023   10:13 AM 08/17/2023   10:22 AM 03/14/2023   10:45 AM 03/13/2023   10:11 AM 10/26/2022   12:29 PM  Advanced Directives  Does Patient Have a Medical Advance Directive? Yes No No Yes Yes Yes Yes  Type of Advance Directive Living will;Healthcare Power of [employer] Power of [place];Living will Living will Healthcare Power of [place];Living will Healthcare Power of [place];Living will  Does patient want to make changes to medical advance directive?       No - Patient declined  Copy of Healthcare Power of Attorney in Chart? No - copy requested     No - copy requested No - copy requested    Current Medications (verified) Outpatient Encounter Medications as of 03/28/2024  Medication Sig   aspirin EC 81 MG tablet Take 81 mg by mouth daily. Swallow whole.   atorvastatin  (LIPITOR) 10 MG tablet TAKE 1 TABLET BY MOUTH EVERY DAY   Cyanocobalamin  (B-12) 1000 MCG CAPS Take 1,000 capsules by mouth daily.   D 1000 25 MCG (1000 UT) capsule Take 1,000 Units by mouth daily.   lisinopril  (ZESTRIL ) 5 MG tablet Take 1 tablet (5 mg total) by mouth daily.   pantoprazole  (PROTONIX ) 40 MG tablet  Take 1 tablet (40 mg total) by mouth daily.   traMADol  (ULTRAM ) 50 MG tablet Take 1 tablet (50 mg total) by mouth every 6 (six) hours as needed for moderate pain (pain score 4-6) or severe pain (pain score 7-10).   No facility-administered encounter medications on file as of 03/28/2024.    Allergies (verified) Codeine and Hydrocodone  bit-homatrop mbr   History: Past Medical History:  Diagnosis Date   Back pain    Chronic constipation    past hx- no current constipation 02-2021   Hyperlipidemia    Hypertension    Multiple gastric ulcers    Osteopenia    PONV (postoperative nausea and vomiting)    Single kidney    donated kidney    Past Surgical History:  Procedure Laterality Date   CLEFT PALATE REPAIR     COLONOSCOPY     FOOT NEUROMA SURGERY  2/06   NEPHRECTOMY LIVING DONOR     Left   REVERSE SHOULDER ARTHROPLASTY Left 09/28/2023   Procedure: REVERSE SHOULDER ARTHROPLASTY;  Surgeon: Winston Hawking, MD;  Location: [facility];  Service: Orthopedics;  Laterality: Left;  interscalene block 120   TUBAL LIGATION     UPPER GASTROINTESTINAL ENDOSCOPY     Family History  Problem Relation Age of Onset   Colon cancer Mother    Diabetes Mother    Coronary artery disease Mother  Osteoporosis Mother    Dementia Mother    Coronary artery disease Father    Heart failure Father    Hypertension Father    Stomach cancer Neg Hx    Pancreatic cancer Neg Hx    Esophageal cancer Neg Hx    Liver disease Neg Hx    Colon polyps Neg Hx    Rectal cancer Neg Hx    Breast cancer Neg Hx    Social History   Socioeconomic History   Marital status: Married    Spouse name: Donavon Fudge   Number of children: Not on file   Years of education: Not on file   Highest education level: Not on file  Occupational History   Occupation: works at a church  Tobacco Use   Smoking status: Never   Smokeless tobacco: Never  Vaping Use   Vaping status: Never Used  Substance and Sexual Activity   Alcohol use: No    Drug use: No   Sexual activity: Not Currently  Other Topics Concern   Not on file  Social History Narrative   Left handed    Are you right handed or left handed?    Are you currently employed ?    What is your current occupation? retired   Do you live at home alone?   Who lives with you? husband   What type of home do you live in: 1 story or 2 story? Two     Caffeine none   Social Drivers of Corporate investment banker Strain: Low Risk  (03/28/2024)   Overall Financial Resource Strain (CARDIA)    Difficulty of Paying Living Expenses: Not hard at all  Food Insecurity: No Food Insecurity (03/28/2024)   Hunger Vital Sign    Worried About Running Out of Food in the Last Year: Never true    Ran Out of Food in the Last Year: Never true  Transportation Needs: No Transportation Needs (03/28/2024)   PRAPARE - Administrator, Civil Service (Medical): No    Lack of Transportation (Non-Medical): No  Physical Activity: Insufficiently Active (03/28/2024)   Exercise Vital Sign    Days of Exercise per Week: 3 days    Minutes of Exercise per Session: 30 min  Stress: No Stress Concern Present (03/28/2024)   [employer] of Occupational Health - Occupational Stress Questionnaire    Feeling of Stress: Not at all  Social Connections: Socially Integrated (03/28/2024)   Social Connection and Isolation Panel    Frequency of Communication with Friends and Family: More than three times a week    Frequency of Social Gatherings with Friends and Family: Once a week    Attends Religious Services: More than 4 times per year    Active Member of [employer] or Organizations: Yes    Attends Engineer, structural: More than 4 times per year    Marital Status: Married    Tobacco Counseling Counseling given: Not Answered    Clinical Intake:  Pre-visit preparation completed: Yes  Pain : No/denies pain     BMI - recorded: 24.69 Nutritional Status: BMI of 19-24  Normal Nutritional  Risks: None Diabetes: No  Lab Results  Component Value Date   HGBA1C 6.0 05/28/2023   HGBA1C 6.1 04/18/2022   HGBA1C 5.9 08/10/2021     How often do you need to have someone help you when you read instructions, pamphlets, or other written materials from your doctor or pharmacy?: 1 - Never  Interpreter Needed?: No  Comments: lives with husband Information entered by :: B.Oreta Soloway,LPN   Activities of Daily Living     03/28/2024    1:28 PM 09/20/2023   10:13 AM  In your present state of health, do you have any difficulty performing the following activities:  Hearing? 0 0  Vision? 0 0  Difficulty concentrating or making decisions? 1   Walking or climbing stairs? 0   Dressing or bathing? 0   Doing errands, shopping? 0   Preparing Food and eating ? N   Using the Toilet? N   In the past six months, have you accidently leaked urine? Y   Do you have problems with loss of bowel control? N   Managing your Medications? N   Managing your Finances? N   Housekeeping or managing your Housekeeping? N     Patient Care Team: Tower, Manley Seeds, MD as PCP - General Rudine Cos, MD as Consulting Physician (Ophthalmology) Ellene Gustin, MD as Consulting Physician (Neurology)  I have updated your Care Teams any recent Medical Services you may have received from other providers in the past year.     Assessment:   This is a routine wellness examination for [place].  Hearing/Vision screen Hearing Screening - Comments:: Pt hearing is good Vision Screening - Comments:: Pt says her vision is good with glasses/ Dr Ian Maine   Goals Addressed             This Visit's Progress    Increase physical activity   Not on track    03/28/24- I will attempt to walk at least 1 mile daily.        Depression Screen     03/28/2024    1:21 PM 06/04/2023    9:30 AM 03/13/2023   10:07 AM 02/19/2023    8:29 AM 01/19/2023    8:29 AM 04/18/2022    9:26 AM 02/28/2022    4:10 PM  PHQ 2/9 Scores  PHQ -  2 Score 0 0 0 0 0 0 0  PHQ- 9 Score  2 0 2 1      Fall Risk     03/28/2024    1:15 PM 11/01/2023    8:30 AM 08/17/2023   10:22 AM 06/04/2023    9:30 AM 03/13/2023   10:12 AM  Fall Risk   Falls in the past year? 0 0 1 1 0  Number falls in past yr: 0 0 0 0 0  Injury with Fall? 1 0 1 0 0  Comment left shoulder injury      Risk for fall due to : No Fall Risks   History of fall(s) No Fall Risks  Follow up Education provided;Falls prevention discussed Falls evaluation completed Falls evaluation completed Falls evaluation completed Falls prevention discussed;Falls evaluation completed    MEDICARE RISK AT HOME:  Medicare Risk at Home Any stairs in or around the home?: Yes If so, are there any without handrails?: Yes Home free of loose throw rugs in walkways, pet beds, electrical cords, etc?: Yes Adequate lighting in your home to reduce risk of falls?: Yes Life alert?: No Use of a cane, [place] or w/c?: No Grab bars in the bathroom?: Yes Shower chair or bench in shower?: No Elevated toilet seat or a handicapped toilet?: Yes  TIMED UP AND GO:  Was the test performed?  No  Cognitive Function: 6CIT completed    02/22/2021    3:38 PM 02/17/2020   10:41 AM 01/29/2018    9:16 AM  01/12/2017    8:44 AM  MMSE - Mini Mental State Exam  Orientation to time 5 5 5 5    Orientation to Place 5 5 5 5    Registration 3 3 3 3    Attention/ Calculation 5 5 0 0   Recall 3 3 3 2    Recall-comments    pt was unable to recall 1 of 3 words   Language- name 2 objects   0 0   Language- repeat 1 1 1 1   Language- follow 3 step command   3 3   Language- read & follow direction   0 0   Write a sentence   0 0   Copy design   0 0   Total score   20 19      Data saved with a previous flowsheet row definition        03/28/2024    1:30 PM 03/13/2023   10:08 AM 02/28/2022    4:11 PM  6CIT Screen  What Year? 0 points 0 points 0 points  What month? 0 points 0 points 0 points  What time? 0 points 0 points 0 points   Count back from 20 0 points 0 points 0 points  Months in reverse 0 points 0 points 0 points  Repeat phrase 0 points 0 points 0 points  Total Score 0 points 0 points 0 points    Immunizations Immunization History  Administered Date(s) Administered   Fluad Quad(high Dose 65+) 06/28/2022   Fluad Trivalent(High Dose 65+) 06/04/2023   Influenza Split 09/20/2012   Influenza, High Dose Seasonal PF 06/26/2018, 07/09/2019   Influenza,inj,Quad PF,6+ Mos 06/11/2015   Influenza-Unspecified 07/09/2016   PFIZER(Purple Top)SARS-COV-2 Vaccination 10/31/2019, 11/22/2019, 08/09/2020   Pneumococcal Conjugate-13 06/11/2015   Pneumococcal Polysaccharide-23 11/14/2013   Td 03/31/2002   Tdap 01/27/2011, 06/05/2023   Zoster Recombinant(Shingrix) 07/23/2019, 04/21/2022   Zoster, Live 11/11/2011    Screening Tests Health Maintenance  Topic Date Due   COVID-19 Vaccine (4 - 2024-25 season) 08/22/2024 (Originally 06/10/2023)   INFLUENZA VACCINE  05/09/2024   MAMMOGRAM  12/30/2024   Medicare Annual Wellness (AWV)  03/28/2025   DTaP/Tdap/Td (4 - Td or Tdap) 06/04/2033   Pneumococcal Vaccine: 50+ Years  Completed   DEXA SCAN  Completed   Zoster Vaccines- Shingrix  Completed   HPV VACCINES  Aged Out   Meningococcal B Vaccine  Aged Out    Health Maintenance  There are no preventive care reminders to display for this patient. Health Maintenance Items Addressed: None at this time  Additional Screening:  Vision Screening: Recommended annual ophthalmology exams for early detection of glaucoma and other disorders of the eye. Would you like a referral to an eye doctor? No    Dental Screening: Recommended annual dental exams for proper oral hygiene  Community Resource Referral / Chronic Care Management: CRR required this visit?  No   CCM required this visit?  Appt scheduled with PCP   Plan:    I have personally reviewed and noted the following in the patient's chart:   Medical and social  history Use of alcohol, tobacco or illicit drugs  Current medications and supplements including opioid prescriptions. Patient is not currently taking opioid prescriptions. Functional ability and status Nutritional status Physical activity Advanced directives List of other physicians Hospitalizations, surgeries, and ER visits in previous 12 months Vitals Screenings to include cognitive, depression, and falls Referrals and appointments  In addition, I have reviewed and discussed with patient certain preventive protocols, quality metrics,  and best practice recommendations. A written personalized care plan for preventive services as well as general preventive health recommendations were provided to patient.   Nerissa Bannister, LPN   [id number]/82/9562   After Visit Summary: AVS printed and given to pt  Notes: Please refer to Routing Comments.

## 2024-03-28 NOTE — Patient Instructions (Signed)
 Ms. [name] , Thank you for taking time out of your busy schedule to complete your Annual Wellness Visit with me. I enjoyed our conversation and look forward to speaking with you again next year. I, as well as your care team,  appreciate your ongoing commitment to your health goals. Please review the following plan we discussed and let me know if I can assist you in the future. Your Game plan/ To Do List    Follow up Visits: Next Medicare AWV with our clinical staff: 04/01/25 @ 1pm in person   Have you seen your provider in the last 6 months (3 months if uncontrolled diabetes)? Yes Next Office Visit with your provider: 06/04/2024 @ 8:45am Physical  Clinician Recommendations:  Aim for 30 minutes of exercise or brisk walking, 6-8 glasses of water , and 5 servings of fruits and vegetables each day.       This is a list of the screening recommended for you and due dates:  Health Maintenance  Topic Date Due   COVID-19 Vaccine (4 - 2024-25 season) 08/22/2024*   Flu Shot  05/09/2024   Mammogram  12/30/2024   Medicare Annual Wellness Visit  03/28/2025   DTaP/Tdap/Td vaccine (4 - Td or Tdap) 06/04/2033   Pneumococcal Vaccine for age over 18  Completed   DEXA scan (bone density measurement)  Completed   Zoster (Shingles) Vaccine  Completed   HPV Vaccine  Aged Out   Meningitis B Vaccine  Aged Out  *Topic was postponed. The date shown is not the original due date.    Advanced directives: (Copy Requested) Please bring a copy of your health care power of attorney and living will to the office to be added to your chart at your convenience. You can mail to [facility] [address] [place], [place] [address] or email to [email or web address] Advance Care Planning is important because it:  [x]  Makes sure you receive the medical care that is consistent with your values, goals, and preferences  [x]  It provides guidance to your family and loved ones and reduces their decisional  burden about whether or not they are making the right decisions based on your wishes.  Follow the link provided in your after visit summary or read over the paperwork we have mailed to you to help you started getting your Advance Directives in place. If you need assistance in completing these, please reach out to us  so that we can help you!

## 2024-06-04 ENCOUNTER — Ambulatory Visit: Payer: Self-pay | Admitting: Family Medicine

## 2024-06-04 ENCOUNTER — Encounter: Payer: Self-pay | Admitting: Family Medicine

## 2024-06-04 ENCOUNTER — Ambulatory Visit (INDEPENDENT_AMBULATORY_CARE_PROVIDER_SITE_OTHER): Admitting: Family Medicine

## 2024-06-04 VITALS — BP 130/76 | HR 76 | Temp 98.3°F | Ht 61.25 in | Wt 131.2 lb

## 2024-06-04 DIAGNOSIS — E7800 Pure hypercholesterolemia, unspecified: Secondary | ICD-10-CM | POA: Diagnosis not present

## 2024-06-04 DIAGNOSIS — E538 Deficiency of other specified B group vitamins: Secondary | ICD-10-CM

## 2024-06-04 DIAGNOSIS — Z0000 Encounter for general adult medical examination without abnormal findings: Secondary | ICD-10-CM

## 2024-06-04 DIAGNOSIS — Z79899 Other long term (current) drug therapy: Secondary | ICD-10-CM

## 2024-06-04 DIAGNOSIS — R7303 Prediabetes: Secondary | ICD-10-CM | POA: Diagnosis not present

## 2024-06-04 DIAGNOSIS — I10 Essential (primary) hypertension: Secondary | ICD-10-CM

## 2024-06-04 DIAGNOSIS — R944 Abnormal results of kidney function studies: Secondary | ICD-10-CM

## 2024-06-04 DIAGNOSIS — M8000XA Age-related osteoporosis with current pathological fracture, unspecified site, initial encounter for fracture: Secondary | ICD-10-CM

## 2024-06-04 LAB — COMPREHENSIVE METABOLIC PANEL WITH GFR
ALT: 10 U/L (ref 0–35)
AST: 17 U/L (ref 0–37)
Albumin: 4.1 g/dL (ref 3.5–5.2)
Alkaline Phosphatase: 79 U/L (ref 39–117)
BUN: 16 mg/dL (ref 6–23)
CO2: 30 meq/L (ref 19–32)
Calcium: 9.2 mg/dL (ref 8.4–10.5)
Chloride: 100 meq/L (ref 96–112)
Creatinine, Ser: 1.07 mg/dL (ref 0.40–1.20)
GFR: 48.61 mL/min — ABNORMAL LOW (ref >60.00)
Glucose, Bld: 92 mg/dL (ref 70–99)
Potassium: 5.2 meq/L — ABNORMAL HIGH (ref 3.5–5.1)
Sodium: 138 meq/L (ref 135–145)
Total Bilirubin: 0.5 mg/dL (ref 0.2–1.2)
Total Protein: 6.6 g/dL (ref 6.0–8.3)

## 2024-06-04 LAB — CBC WITH DIFFERENTIAL/PLATELET
Basophils Absolute: 0.1 K/uL (ref 0.0–0.1)
Basophils Relative: 0.7 % (ref 0.0–3.0)
Eosinophils Absolute: 0.1 K/uL (ref 0.0–0.7)
Eosinophils Relative: 1.9 % (ref 0.0–5.0)
HCT: 39.4 % (ref 36.0–46.0)
Hemoglobin: 12.9 g/dL (ref 12.0–15.0)
Lymphocytes Relative: 18.6 % (ref 12.0–46.0)
Lymphs Abs: 1.4 K/uL (ref 0.7–4.0)
MCHC: 32.8 g/dL (ref 30.0–36.0)
MCV: 92.7 fl (ref 78.0–100.0)
Monocytes Absolute: 0.4 K/uL (ref 0.1–1.0)
Monocytes Relative: 5 % (ref 3.0–12.0)
Neutro Abs: 5.4 K/uL (ref 1.4–7.7)
Neutrophils Relative %: 73.8 % (ref 43.0–77.0)
Platelets: 262 K/uL (ref 150.0–400.0)
RBC: 4.25 Mil/uL (ref 3.87–5.11)
RDW: 13.3 % (ref 11.5–15.5)
WBC: 7.4 K/uL (ref 4.0–10.5)

## 2024-06-04 LAB — VITAMIN D 25 HYDROXY (VIT D DEFICIENCY, FRACTURES): VITD: 49.45 ng/mL (ref 30.00–100.00)

## 2024-06-04 LAB — LIPID PANEL
Cholesterol: 194 mg/dL (ref 0–200)
HDL: 55.1 mg/dL (ref >39.00)
LDL Cholesterol: 114 mg/dL — ABNORMAL HIGH (ref 0–99)
NonHDL: 138.78
Total CHOL/HDL Ratio: 4
Triglycerides: 125 mg/dL (ref 0.0–149.0)
VLDL: 25 mg/dL (ref 0.0–40.0)

## 2024-06-04 LAB — TSH: TSH: 1.86 u[IU]/mL (ref 0.35–5.50)

## 2024-06-04 LAB — HEMOGLOBIN A1C: Hgb A1c MFr Bld: 6.3 % (ref 4.6–6.5)

## 2024-06-04 NOTE — Assessment & Plan Note (Signed)
Disc goals for lipids and reasons to control them Rev last labs with pt Rev low sat fat diet in detail  Lab today Taking atorvastatin 10 mg daily

## 2024-06-04 NOTE — Assessment & Plan Note (Signed)
 Lab Results  Component Value Date   VITAMINB12 854 12/20/2023   Oral supplementation  On ppi

## 2024-06-04 NOTE — Assessment & Plan Note (Signed)
 Dexa 11/2022  One fall and fracture  D level today Discussed fall prevention, supplements and exercise for bone density   Would like to start pt on medication  In light of GI history-unsure if would tolerate oral bisphosphonate Would like to look into potential coverage of prolia Info given / she will check in with insurance co and let us  know   No current dental work planned or dental problems

## 2024-06-04 NOTE — Progress Notes (Signed)
 Subjective:    Patient ID: [name], female    DOB: 08-04-1941, 83 y.o.   MRN: [id number]  HPI  Here for health maintenance exam and to review chronic medical problems   Wt Readings from Last 3 Encounters:  06/04/24 131 lb 4 oz (59.5 kg)  03/28/24 132 lb 12.8 oz (60.2 kg)  12/20/23 131 lb 4 oz (59.5 kg)   24.60 kg/m  Vitals:   06/04/24 0902  BP: 130/76  Pulse: 76  Temp: 98.3 F (36.8 C)  SpO2: 98%    Immunization History  Administered Date(s) Administered   Fluad Quad(high Dose 65+) 06/28/2022   Fluad Trivalent(High Dose 65+) 06/04/2023   INFLUENZA, HIGH DOSE SEASONAL PF 06/26/2018, 07/09/2019   Influenza Split 09/20/2012   Influenza,inj,Quad PF,6+ Mos 06/11/2015   Influenza-Unspecified 07/09/2016   PFIZER(Purple Top)SARS-COV-2 Vaccination 10/31/2019, 11/22/2019, 08/09/2020   Pneumococcal Conjugate-13 06/11/2015   Pneumococcal Polysaccharide-23 11/14/2013   Td 03/31/2002   Tdap 01/27/2011, 06/05/2023   Zoster Recombinant(Shingrix) 07/23/2019, 04/21/2022   Zoster, Live 11/11/2011    There are no preventive care reminders to display for this patient.  Had shoulder replacement - coming along nicely   Gets flu shot every fall   Mammogram 12/2023  Self breast exam-no lumps   Gyn health-no problems    Colon cancer screening  2019 neg cologuard  Colonoscopy 2015   Bone health  Dexa  11/2022 osteoporosis  Falls -one  Fractures- one  Supplements -vitamin D   Last vitamin D  Lab Results  Component Value Date   VD25OH 57.13 05/28/2023    Exercise -not a lot  Lots of physical therapy for shoulder after surgery [place]ks to barn several times per day   Derm care  Utd with visits  No new skin cancers    Mood    06/04/2024    9:12 AM 03/28/2024    1:21 PM 06/04/2023    9:30 AM 03/13/2023   10:07 AM 02/19/2023    8:29 AM  Depression screen PHQ 2/9  Decreased Interest 0 0 0 0 0  Down, Depressed, Hopeless 0 0 0 0 0  PHQ - 2 Score 0 0 0 0 0  Altered  sleeping 0  1 0 1  Tired, decreased energy 1  1 0 1  Change in appetite 0  0 0 0  Feeling bad or failure about yourself  0  0 0 0  Trouble concentrating 0  0 0 0  Moving slowly or fidgety/restless 0  0 0 0  Suicidal thoughts 0  0 0 0  PHQ-9 Score 1  2 0 2  Difficult doing work/chores Not difficult at all  Not difficult at all Not difficult at all Not difficult at all   Husband has bad dementia    HTN bp is stable today  No cp or palpitations or headaches or edema  No side effects to medicines  BP Readings from Last 3 Encounters:  06/04/24 130/76  03/28/24 120/72  12/20/23 122/65    Lisinopril  5 mg daily   Lab Results  Component Value Date   NA 136 12/20/2023   K 4.2 12/20/2023   CO2 26 12/20/2023   GLUCOSE 110 (H) 12/20/2023   BUN 18 12/20/2023   CREATININE 1.14 12/20/2023   CALCIUM  9.4 12/20/2023   GFR 45.19 (L) 12/20/2023   GFRNONAA 59 (L) 09/20/2023    B12 def Lab Results  Component Value Date   VITAMINB12 854 12/20/2023   Hyperlipidemia Lab Results  Component Value Date  CHOL 181 05/28/2023   HDL 45.60 05/28/2023   LDLCALC 107 (H) 05/28/2023   TRIG 140.0 05/28/2023   CHOLHDL 4 05/28/2023   Atorvastatin  10 mg daily   Prediabetes Lab Results  Component Value Date   HGBA1C 6.0 05/28/2023   HGBA1C 6.1 04/18/2022   HGBA1C 5.9 08/10/2021      Patient Active Problem List   Diagnosis Date Noted   Facial paresthesia 01/19/2023   Current use of proton pump inhibitor 04/18/2022   Decreased GFR 04/18/2022   Vitamin B12 deficiency 04/18/2022   Multiple gastric ulcers 04/27/2021   Effusion of right knee joint 05/31/2020   Pain in right knee 05/31/2020   Pain in joint of left shoulder 05/20/2020   Pain in right foot 04/08/2020   Closed fracture of shaft of right fibula 01/08/2020   Pain in joint of right ankle 01/08/2020   Hematoma of left breast 01/02/2020   Carpal tunnel syndrome 05/27/2018   Pain in joint of right foot 04/24/2018   Hyperkalemia  02/01/2018   Encounter for screening mammogram for breast cancer 01/19/2017   Routine general medical examination at a health care facility 06/11/2015   Family history of colon cancer 11/14/2013   Colon cancer screening 11/14/2013   Hypertension 01/27/2011   Osteoporosis 01/27/2011   Low back pain radiating to left leg 03/13/2008   Hyperlipidemia 12/20/2007   CONSTIPATION, CHRONIC 12/20/2007   Prediabetes 12/20/2007   Past Medical History:  Diagnosis Date   Back pain    Chronic constipation    past hx- no current constipation 02-2021   Hyperlipidemia    Hypertension    Multiple gastric ulcers    Osteopenia    PONV (postoperative nausea and vomiting)    Single kidney    donated kidney    Past Surgical History:  Procedure Laterality Date   CLEFT PALATE REPAIR     COLONOSCOPY     FOOT NEUROMA SURGERY  2/06   NEPHRECTOMY LIVING DONOR     Left   REVERSE SHOULDER ARTHROPLASTY Left 09/28/2023   Procedure: REVERSE SHOULDER ARTHROPLASTY;  Surgeon: Kay Kemps, MD;  Location: [facility];  Service: Orthopedics;  Laterality: Left;  interscalene block 120   TUBAL LIGATION     UPPER GASTROINTESTINAL ENDOSCOPY     Social History   Tobacco Use   Smoking status: Never   Smokeless tobacco: Never  Vaping Use   Vaping status: Never Used  Substance Use Topics   Alcohol use: No   Drug use: No   Family History  Problem Relation Age of Onset   Colon cancer Mother    Diabetes Mother    Coronary artery disease Mother    Osteoporosis Mother    Dementia Mother    Coronary artery disease Father    Heart failure Father    Hypertension Father    Stomach cancer Neg Hx    Pancreatic cancer Neg Hx    Esophageal cancer Neg Hx    Liver disease Neg Hx    Colon polyps Neg Hx    Rectal cancer Neg Hx    Breast cancer Neg Hx    Allergies  Allergen Reactions   Codeine Other (See Comments)    Unknown   Hydrocodone  Bit-Homatrop Mbr     Caused flu-like symptoms   Current Outpatient  Medications on File Prior to Visit  Medication Sig Dispense Refill   aspirin EC 81 MG tablet Take 81 mg by mouth daily. Swallow whole.     atorvastatin  (LIPITOR) 10  MG tablet TAKE 1 TABLET BY MOUTH EVERY DAY 90 tablet 1   Cyanocobalamin  (B-12) 1000 MCG CAPS Take 1,000 capsules by mouth daily.     D 1000 25 MCG (1000 UT) capsule Take 1,000 Units by mouth daily.     lisinopril  (ZESTRIL ) 5 MG tablet Take 1 tablet (5 mg total) by mouth daily. 90 tablet 1   pantoprazole  (PROTONIX ) 40 MG tablet Take 1 tablet (40 mg total) by mouth daily. 90 tablet 3   No current facility-administered medications on file prior to visit.    Review of Systems  Constitutional:  Positive for fatigue. Negative for activity change, appetite change, fever and unexpected weight change.  HENT:  Negative for congestion, ear pain, rhinorrhea, sinus pressure and sore throat.   Eyes:  Negative for pain, redness and visual disturbance.  Respiratory:  Negative for cough, shortness of breath and wheezing.   Cardiovascular:  Negative for chest pain and palpitations.  Gastrointestinal:  Negative for abdominal pain, blood in stool, constipation and diarrhea.  Endocrine: Negative for polydipsia and polyuria.  Genitourinary:  Negative for dysuria, frequency and urgency.  Musculoskeletal:  Negative for arthralgias, back pain and myalgias.  Skin:  Negative for pallor and rash.  Allergic/Immunologic: Negative for environmental allergies.  Neurological:  Negative for dizziness, syncope and headaches.  Hematological:  Negative for adenopathy. Does not bruise/bleed easily.  Psychiatric/Behavioral:  Negative for decreased concentration and dysphoric mood. The patient is nervous/anxious.        Caregiver stress worry       Objective:   Physical Exam Constitutional:      General: She is not in acute distress.    Appearance: Normal appearance. She is well-developed and normal weight. She is not ill-appearing or diaphoretic.  HENT:      Head: Normocephalic and atraumatic.     Right Ear: Tympanic membrane, ear canal and external ear normal.     Left Ear: Tympanic membrane, ear canal and external ear normal.     Nose: Nose normal. No congestion.     Mouth/Throat:     Mouth: Mucous membranes are moist.     Pharynx: Oropharynx is clear. No posterior oropharyngeal erythema.  Eyes:     General: No scleral icterus.    Extraocular Movements: Extraocular movements intact.     Conjunctiva/sclera: Conjunctivae normal.     Pupils: Pupils are equal, round, and reactive to light.  Neck:     Thyroid : No thyromegaly.     Vascular: No carotid bruit or JVD.  Cardiovascular:     Rate and Rhythm: Normal rate and regular rhythm.     Pulses: Normal pulses.     Heart sounds: Normal heart sounds.     No gallop.  Pulmonary:     Effort: Pulmonary effort is normal. No respiratory distress.     Breath sounds: Normal breath sounds. No wheezing.     Comments: Good air exch Chest:     Chest wall: No tenderness.  Abdominal:     General: Bowel sounds are normal. There is no distension or abdominal bruit.     Palpations: Abdomen is soft. There is no mass.     Tenderness: There is no abdominal tenderness.     Hernia: No hernia is present.  Genitourinary:    Comments: Breast exam: No mass, nodules, thickening, tenderness, bulging, retraction, inflamation, nipple discharge or skin changes noted.  No axillary or clavicular LA.     Musculoskeletal:        General: No  tenderness. Normal range of motion.     Cervical back: Normal range of motion and neck supple. No rigidity. No muscular tenderness.     Right lower leg: No edema.     Left lower leg: No edema.     Comments: No kyphosis   Lymphadenopathy:     Cervical: No cervical adenopathy.  Skin:    General: Skin is warm and dry.     Coloration: Skin is not pale.     Findings: No erythema or rash.     Comments: Solar lentigines diffusely Scattered sks   Neurological:     Mental Status: She  is alert. Mental status is at baseline.     Cranial Nerves: No cranial nerve deficit.     Motor: No abnormal muscle tone.     Coordination: Coordination normal.     Gait: Gait normal.     Deep Tendon Reflexes: Reflexes are normal and symmetric. Reflexes normal.  Psychiatric:        Mood and Affect: Mood normal.        Cognition and Memory: Cognition and memory normal.           Assessment & Plan:   Problem List Items Addressed This Visit       Cardiovascular and Mediastinum   Hypertension   bp in fair control at this time  BP Readings from Last 1 Encounters:  06/04/24 130/76   No changes needed Most recent labs reviewed  Disc lifstyle change with low sodium diet and exercise  On lisinopril  10  Lab today for bmet/ past history of high Kbp in fair control at this time  BP Readings from Last 1 Encounters:  06/04/24 130/76   No changes needed Most recent labs reviewed  Disc lifstyle change with low sodium diet and exercise  On lisinopril  10  Lab today for bmet/ past history of high Kbp in fair control at this time  BP Readings from Last 1 Encounters:  06/04/24 130/76   No changes needed Most recent labs reviewed  Disc lifstyle change with low sodium diet and exercise  On lisinopril  5 mg daily (10 mg caused hyperkalemia)        Relevant Orders   Comprehensive metabolic panel with GFR   CBC with Differential/Platelet   Lipid Panel   TSH     Musculoskeletal and Integument   Osteoporosis   Dexa 11/2022  One fall and fracture  D level today Discussed fall prevention, supplements and exercise for bone density   Would like to start pt on medication  In light of GI history-unsure if would tolerate oral bisphosphonate Would like to look into potential coverage of prolia Info given / she will check in with insurance co and let us  know   No current dental work planned or dental problems        Relevant Orders   TSH   VITAMIN D  25 Hydroxy (Vit-D Deficiency,  Fractures)     Other   Vitamin B12 deficiency   Lab Results  Component Value Date   VITAMINB12 854 12/20/2023   Oral supplementation  On ppi       Routine general medical examination at a health care facility - Primary   Reviewed health habits including diet and exercise and skin cancer prevention Reviewed appropriate screening tests for age  Also reviewed health mt list, fam hx and immunization status , as well as social and family history   See HPI Labs reviewed and ordered Health Maintenance  Topic Date Due   COVID-19 Vaccine (4 - 2024-25 season) 08/22/2024*   Flu Shot  01/06/2025*   Mammogram  12/30/2024   Medicare Annual Wellness Visit  03/28/2025   DTaP/Tdap/Td vaccine (4 - Td or Tdap) 06/04/2033   Pneumococcal Vaccine for age over 28  Completed   DEXA scan (bone density measurement)  Completed   Zoster (Shingles) Vaccine  Completed   HPV Vaccine  Aged Out   Meningitis B Vaccine  Aged Out  *Topic was postponed. The date shown is not the original due date.   Plans flu shot in the fall  Discussed fall prevention, supplements and exercise for bone density  (strength building)  Utd derm care  PHQ 0 despite caregiver stress       Prediabetes   A1c ordered disc imp of low glycemic diet and wt loss to prevent DM2       Relevant Orders   Lipid Panel   Hemoglobin A1c   Hyperlipidemia   Disc goals for lipids and reasons to control them Rev last labs with pt Rev low sat fat diet in detail  Lab today  Taking atorvastatin  10 mg daily       Relevant Orders   Comprehensive metabolic panel with GFR   Lipid Panel   TSH   Decreased GFR   Bmet ordered  On low dose lisinopril  No nsaids Encouraged water  intake       Current use of proton pump inhibitor   Lab Results  Component Value Date   VITAMINB12 854 12/20/2023    D level with lab today

## 2024-06-04 NOTE — Assessment & Plan Note (Signed)
 Reviewed health habits including diet and exercise and skin cancer prevention Reviewed appropriate screening tests for age  Also reviewed health mt list, fam hx and immunization status , as well as social and family history   See HPI Labs reviewed and ordered Health Maintenance  Topic Date Due   COVID-19 Vaccine (4 - 2024-25 season) 08/22/2024*   Flu Shot  01/06/2025*   Mammogram  12/30/2024   Medicare Annual Wellness Visit  03/28/2025   DTaP/Tdap/Td vaccine (4 - Td or Tdap) 06/04/2033   Pneumococcal Vaccine for age over 65  Completed   DEXA scan (bone density measurement)  Completed   Zoster (Shingles) Vaccine  Completed   HPV Vaccine  Aged Out   Meningitis B Vaccine  Aged Out  *Topic was postponed. The date shown is not the original due date.   Plans flu shot in the fall  Discussed fall prevention, supplements and exercise for bone density  (strength building)  Utd derm care  PHQ 0 despite caregiver stress

## 2024-06-04 NOTE — Patient Instructions (Addendum)
 Stay active/ walking  Add some strength training to your routine, this is important for bone and brain health and can reduce your risk of falls and help your body use insulin properly and regulate weight  Light weights, exercise bands , and internet videos are a good way to start  Yoga (chair or regular), machines , floor exercises or a gym with machines are also good options   We need to treat you for osteoporosis  I am interested to see if a medicine called prolia may be covered Read about it  Check with your insurance co as well- let me know if you are interested   I want you to consider sunscreen or sunscreen clothing for outdoor time   Labs today   If you want to return to discuss memory and mood-please schedule an appointment

## 2024-06-04 NOTE — Assessment & Plan Note (Signed)
 A1c ordered   disc imp of low glycemic diet and wt loss to prevent DM2

## 2024-06-04 NOTE — Assessment & Plan Note (Signed)
 Lab Results  Component Value Date   VITAMINB12 854 12/20/2023    D level with lab today

## 2024-06-04 NOTE — Assessment & Plan Note (Signed)
 Bmet ordered  On low dose lisinopril  No nsaids Encouraged water  intake

## 2024-06-04 NOTE — Assessment & Plan Note (Signed)
 bp in fair control at this time  BP Readings from Last 1 Encounters:  06/04/24 130/76   No changes needed Most recent labs reviewed  Disc lifstyle change with low sodium diet and exercise  On lisinopril  10  Lab today for bmet/ past history of high Kbp in fair control at this time  BP Readings from Last 1 Encounters:  06/04/24 130/76   No changes needed Most recent labs reviewed  Disc lifstyle change with low sodium diet and exercise  On lisinopril  10  Lab today for bmet/ past history of high Kbp in fair control at this time  BP Readings from Last 1 Encounters:  06/04/24 130/76   No changes needed Most recent labs reviewed  Disc lifstyle change with low sodium diet and exercise  On lisinopril  5 mg daily (10 mg caused hyperkalemia)

## 2024-06-05 MED ORDER — AMLODIPINE BESYLATE 5 MG PO TABS
5.0000 mg | ORAL_TABLET | Freq: Every day | ORAL | 0 refills | Status: DC
Start: 2024-06-05 — End: 2024-07-08

## 2024-06-06 ENCOUNTER — Other Ambulatory Visit: Payer: Self-pay | Admitting: Family Medicine

## 2024-07-01 DIAGNOSIS — M79645 Pain in left finger(s): Secondary | ICD-10-CM | POA: Diagnosis not present

## 2024-07-01 DIAGNOSIS — M65331 Trigger finger, right middle finger: Secondary | ICD-10-CM | POA: Diagnosis not present

## 2024-07-08 ENCOUNTER — Encounter: Payer: Self-pay | Admitting: Family Medicine

## 2024-07-08 ENCOUNTER — Ambulatory Visit: Payer: Self-pay | Admitting: Family Medicine

## 2024-07-08 ENCOUNTER — Telehealth: Payer: Self-pay

## 2024-07-08 ENCOUNTER — Ambulatory Visit (INDEPENDENT_AMBULATORY_CARE_PROVIDER_SITE_OTHER): Admitting: Family Medicine

## 2024-07-08 VITALS — BP 126/70 | HR 93 | Temp 98.9°F | Ht 61.25 in | Wt 132.5 lb

## 2024-07-08 DIAGNOSIS — Z636 Dependent relative needing care at home: Secondary | ICD-10-CM | POA: Insufficient documentation

## 2024-07-08 DIAGNOSIS — R7303 Prediabetes: Secondary | ICD-10-CM

## 2024-07-08 DIAGNOSIS — E538 Deficiency of other specified B group vitamins: Secondary | ICD-10-CM

## 2024-07-08 DIAGNOSIS — E875 Hyperkalemia: Secondary | ICD-10-CM | POA: Diagnosis not present

## 2024-07-08 DIAGNOSIS — I10 Essential (primary) hypertension: Secondary | ICD-10-CM | POA: Diagnosis not present

## 2024-07-08 DIAGNOSIS — G8929 Other chronic pain: Secondary | ICD-10-CM | POA: Insufficient documentation

## 2024-07-08 DIAGNOSIS — E7800 Pure hypercholesterolemia, unspecified: Secondary | ICD-10-CM | POA: Diagnosis not present

## 2024-07-08 DIAGNOSIS — M533 Sacrococcygeal disorders, not elsewhere classified: Secondary | ICD-10-CM

## 2024-07-08 LAB — BASIC METABOLIC PANEL WITH GFR
BUN: 16 mg/dL (ref 6–23)
CO2: 29 meq/L (ref 19–32)
Calcium: 9.4 mg/dL (ref 8.4–10.5)
Chloride: 101 meq/L (ref 96–112)
Creatinine, Ser: 1.1 mg/dL (ref 0.40–1.20)
GFR: 46.99 mL/min — ABNORMAL LOW (ref >60.00)
Glucose, Bld: 105 mg/dL — ABNORMAL HIGH (ref 70–99)
Potassium: 4.2 meq/L (ref 3.5–5.1)
Sodium: 136 meq/L (ref 135–145)

## 2024-07-08 MED ORDER — PREDNISONE 20 MG PO TABS: 20.0000 mg | ORAL_TABLET | Freq: Every day | ORAL | 0 refills | Status: AC

## 2024-07-08 MED ORDER — AMLODIPINE BESYLATE 5 MG PO TABS: 5.0000 mg | ORAL_TABLET | Freq: Every day | ORAL | 3 refills | Status: AC

## 2024-07-08 NOTE — Assessment & Plan Note (Signed)
 bp in fair control at this time  BP Readings from Last 1 Encounters:  07/08/24 126/70   No changes needed Most recent labs reviewed  Disc lifstyle change with low sodium diet and exercise  Will continue amlodipine  5 mg (K should be normal/ checking bmet today) Encouraged to check at home occasionally  Encouraged lower na diet

## 2024-07-08 NOTE — Assessment & Plan Note (Signed)
 K of 5.2 on ace Changed this to amlodipine   Bmet today

## 2024-07-08 NOTE — Assessment & Plan Note (Signed)
 Disc goals for lipids and reasons to control them Rev last labs with pt Rev low sat fat diet in detail LDL 114  Not as much time for food prep Plans to work on it

## 2024-07-08 NOTE — Progress Notes (Signed)
 Complex Care Management Note  Care Guide Note 07/08/2024 Name: [name] MRN: [id number] DOB: 01-03-1941  [name] is a 83 y.o. year old female who sees Tower, Laine LABOR, MD for primary care. I reached out to Meade LOISE Finder by phone today to offer complex care management services.  [name] was given information about Complex Care Management services today including:   The Complex Care Management services include support from the care team which includes your Nurse Care Manager, Clinical Social Worker, or Pharmacist.  The Complex Care Management team is here to help remove barriers to the health concerns and goals most important to you. Complex Care Management services are voluntary, and the patient may decline or stop services at any time by request to their care team member.   Complex Care Management Consent Status: Patient agreed to services and verbal consent obtained.   Follow up plan:  Telephone appointment with complex care management team member scheduled for:  07/25/24 @ 11 AM  Encounter Outcome:  Patient Scheduled  Leotis Rase [facility], [facility] Guide  Direct Dial: [phone]  Fax [phone]

## 2024-07-08 NOTE — Patient Instructions (Addendum)
 Try to get most of your carbohydrates from produce (with the exception of white potatoes) and whole grains Eat less bread/pasta/rice/snack foods/cereals/sweets and other items from the middle of the grocery store (processed carbs)  Avoid red meat/ fried foods/ egg yolks/ fatty breakfast meats/ butter, cheese and high fat dairy/ and shellfish   I would like you to talk to a Teacher, adult education about caregiver stress  I put the referral in for our community care organization  Please let us  know if you don't hear in 1-2 weeks to set that up (mychart message or call or letter)   Labs today for potassium  Blood pressure is good with current medicine     Your pain is in the sacroiliac area  Take the prednisone  20 mg daily in the am for a week to calm it down  Also use some heat for 10 minutes whenever you get a chance  Update if not starting to improve in a week or if worsening

## 2024-07-08 NOTE — Assessment & Plan Note (Signed)
 Prediabetes  Lab Results  Component Value Date   HGBA1C 6.3 06/04/2024   HGBA1C 6.0 05/28/2023   HGBA1C 6.1 04/18/2022    disc imp of low glycemic diet and wt loss to prevent DM2

## 2024-07-08 NOTE — Assessment & Plan Note (Signed)
 Pain and tenderness over right SI joint without radicular symptoms Reassuring exam Prednisone  prescription for inflammation 20 mg daily for a week Update if not starting to improve in a week or if worsening  Heat/stretching   Call back and Er precautions noted in detail today   Consider PT and /or imaging if not improved

## 2024-07-08 NOTE — Assessment & Plan Note (Signed)
 Husband with dementia  His personality is changing and difficult to care for  Has to be with her 24 hours per day   No time for her own self care Needs some resources/ help if possible Also for her own mental health   Community care referral placed for SW

## 2024-07-08 NOTE — Progress Notes (Signed)
 Subjective:    Patient ID: [name], female    DOB: 13-Dec-1940, 83 y.o.   MRN: [id number]  HPI  Wt Readings from Last 3 Encounters:  07/08/24 132 lb 8 oz (60.1 kg)  06/04/24 131 lb 4 oz (59.5 kg)  03/28/24 132 lb 12.8 oz (60.2 kg)   24.83 kg/m  Vitals:   07/08/24 0850 07/08/24 0915  BP: (!) 142/82 126/70  Pulse: 93   Temp: 98.9 F (37.2 C)   SpO2: 97%     Pt presents to discuss  HTN Hyperkalemia  Prediabetes  New hip pain (right buttock area)  Caregiver stress     HTN bp is stable today  No cp or palpitations or headaches or edema  No side effects to medicines  BP Readings from Last 3 Encounters:  07/08/24 126/70  06/04/24 130/76  03/28/24 120/72    Due to elevated K we changed lisinopril  to amlodipine   No side effects  Feel ok    Lab Results  Component Value Date   NA 136 07/08/2024   K 4.2 07/08/2024   CO2 29 07/08/2024   GLUCOSE 105 (H) 07/08/2024   BUN 16 07/08/2024   CREATININE 1.10 07/08/2024   CALCIUM  9.4 07/08/2024   GFR 46.99 (L) 07/08/2024   GFRNONAA 59 (L) 09/20/2023   Cholesterol was up a bit Lab Results  Component Value Date   CHOL 194 06/04/2024   HDL 55.10 06/04/2024   LDLCALC 114 (H) 06/04/2024   TRIG 125.0 06/04/2024   CHOLHDL 4 06/04/2024   Atorvastatin  10 mg daily    Prediabetes Lab Results  Component Value Date   HGBA1C 6.3 06/04/2024   HGBA1C 6.0 05/28/2023   HGBA1C 6.1 04/18/2022   Care giving Does not pay attn to what she eats   Sausage/egg/cheese muffin at [employer]    Right buttock pain / lateral hip  About 2-3 weeks ago  Woke up with it  No trauma or injury  Sharp pain  Constant - worsens with position change     Worse to lie on that side  Worse to use stairs   Goes to the gym (does not get to stay long enough) -has to take husband [employer] Pedals bike/ treadmill -these do not hurt    Over the counter  Voltaren gel  Occational advil     Patient Active Problem List    Diagnosis Date Noted   Chronic right SI joint pain 07/08/2024   Caregiver stress 07/08/2024   Facial paresthesia 01/19/2023   Current use of proton pump inhibitor 04/18/2022   Decreased GFR 04/18/2022   Vitamin B12 deficiency 04/18/2022   Multiple gastric ulcers 04/27/2021   Effusion of right knee joint 05/31/2020   Pain in right knee 05/31/2020   Pain in joint of left shoulder 05/20/2020   Pain in right foot 04/08/2020   Closed fracture of shaft of right fibula 01/08/2020   Pain in joint of right ankle 01/08/2020   Hematoma of left breast 01/02/2020   Carpal tunnel syndrome 05/27/2018   Pain in joint of right foot 04/24/2018   Hyperkalemia 02/01/2018   Encounter for screening mammogram for breast cancer 01/19/2017   Routine general medical examination at a health care facility 06/11/2015   Family history of colon cancer 11/14/2013   Colon cancer screening 11/14/2013   Hypertension 01/27/2011   Osteoporosis 01/27/2011   Low back pain radiating to left leg 03/13/2008   Hyperlipidemia 12/20/2007   CONSTIPATION, CHRONIC 12/20/2007   Prediabetes  12/20/2007   Past Medical History:  Diagnosis Date   Back pain    Chronic constipation    past hx- no current constipation 02-2021   Hyperlipidemia    Hypertension    Multiple gastric ulcers    Osteopenia    PONV (postoperative nausea and vomiting)    Single kidney    donated kidney    Past Surgical History:  Procedure Laterality Date   CLEFT PALATE REPAIR     COLONOSCOPY     FOOT NEUROMA SURGERY  2/06   NEPHRECTOMY LIVING DONOR     Left   REVERSE SHOULDER ARTHROPLASTY Left 09/28/2023   Procedure: REVERSE SHOULDER ARTHROPLASTY;  Surgeon: Kay Kemps, MD;  Location: [facility];  Service: Orthopedics;  Laterality: Left;  interscalene block 120   TUBAL LIGATION     UPPER GASTROINTESTINAL ENDOSCOPY     Social History   Tobacco Use   Smoking status: Never   Smokeless tobacco: Never  Vaping Use   Vaping status: Never Used   Substance Use Topics   Alcohol use: No   Drug use: No   Family History  Problem Relation Age of Onset   Colon cancer Mother    Diabetes Mother    Coronary artery disease Mother    Osteoporosis Mother    Dementia Mother    Coronary artery disease Father    Heart failure Father    Hypertension Father    Stomach cancer Neg Hx    Pancreatic cancer Neg Hx    Esophageal cancer Neg Hx    Liver disease Neg Hx    Colon polyps Neg Hx    Rectal cancer Neg Hx    Breast cancer Neg Hx    Allergies  Allergen Reactions   Codeine Other (See Comments)    Unknown   Hydrocodone  Bit-Homatrop Mbr     Caused flu-like symptoms   Lisinopril      Hyperkalemia    Current Outpatient Medications on File Prior to Visit  Medication Sig Dispense Refill   amLODipine  (NORVASC ) 5 MG tablet Take 1 tablet (5 mg total) by mouth daily. 90 tablet 0   aspirin EC 81 MG tablet Take 81 mg by mouth daily. Swallow whole.     atorvastatin  (LIPITOR) 10 MG tablet TAKE 1 TABLET BY MOUTH EVERY DAY 90 tablet 2   Cyanocobalamin  (B-12) 1000 MCG CAPS Take 1,000 capsules by mouth daily.     D 1000 25 MCG (1000 UT) capsule Take 1,000 Units by mouth daily.     pantoprazole  (PROTONIX ) 40 MG tablet Take 1 tablet (40 mg total) by mouth daily. 90 tablet 3   No current facility-administered medications on file prior to visit.    Review of Systems     Objective:   Physical Exam        Assessment & Plan:   Problem List Items Addressed This Visit       Cardiovascular and Mediastinum   Hypertension - Primary   bp in fair control at this time  BP Readings from Last 1 Encounters:  07/08/24 126/70   No changes needed Most recent labs reviewed  Disc lifstyle change with low sodium diet and exercise  Will continue amlodipine  5 mg (K should be normal/ checking bmet today) Encouraged to check at home occasionally  Encouraged lower na diet       Relevant Orders   Basic metabolic panel with GFR (Completed)      Other   Prediabetes   Prediabetes  Lab Results  Component Value Date   HGBA1C 6.3 06/04/2024   HGBA1C 6.0 05/28/2023   HGBA1C 6.1 04/18/2022    disc imp of low glycemic diet and wt loss to prevent DM2        Hyperlipidemia   Disc goals for lipids and reasons to control them Rev last labs with pt Rev low sat fat diet in detail LDL 114  Not as much time for food prep Plans to work on it       Hyperkalemia   K of 5.2 on ace Changed this to amlodipine   Bmet today      Relevant Orders   Basic metabolic panel with GFR (Completed)   Chronic right SI joint pain   Pain and tenderness over right SI joint without radicular symptoms Reassuring exam Prednisone  prescription for inflammation 20 mg daily for a week Update if not starting to improve in a week or if worsening  Heat/stretching   Call back and Er precautions noted in detail today   Consider PT and /or imaging if not improved       Relevant Medications   predniSONE  (DELTASONE ) 20 MG tablet   Caregiver stress   Husband with dementia  His personality is changing and difficult to care for  Has to be with her 24 hours per day   No time for her own self care Needs some resources/ help if possible Also for her own mental health   Community care referral placed for SW       Relevant Orders   AMB Referral [facility] Care Management

## 2024-07-22 DIAGNOSIS — M539 Dorsopathy, unspecified: Secondary | ICD-10-CM | POA: Diagnosis not present

## 2024-07-22 DIAGNOSIS — M5136 Other intervertebral disc degeneration, lumbar region with discogenic back pain only: Secondary | ICD-10-CM | POA: Diagnosis not present

## 2024-07-22 DIAGNOSIS — M5416 Radiculopathy, lumbar region: Secondary | ICD-10-CM | POA: Diagnosis not present

## 2024-07-22 DIAGNOSIS — M25551 Pain in right hip: Secondary | ICD-10-CM | POA: Diagnosis not present

## 2024-07-25 ENCOUNTER — Other Ambulatory Visit: Payer: Self-pay | Admitting: *Deleted

## 2024-07-25 NOTE — Patient Outreach (Signed)
 Patient contacted for scheduled appointment. Patient requested that the call be re-scheduled. Appointment re-scheduled for 08/25/24 11am.   Lenn Mean, LCSW [place]  [facility], [facility] Licensed Clinical Social Worker  Direct Dial: [phone]

## 2024-08-15 ENCOUNTER — Other Ambulatory Visit: Payer: Self-pay | Admitting: *Deleted

## 2024-08-15 NOTE — Patient Outreach (Signed)
 [facility] referral received for caregiver stress. Patient contacted, reason for the referral discussed. Patient requested this social workers information to call this social worker back. Patient did not want to re-schedule at this time,   Kimra Kantor, LCSW [place]  [facility], [facility] Licensed Clinical Social Worker  Direct Dial: [phone]

## 2024-08-18 ENCOUNTER — Other Ambulatory Visit: Payer: Self-pay | Admitting: Internal Medicine

## 2024-08-22 DIAGNOSIS — M5416 Radiculopathy, lumbar region: Secondary | ICD-10-CM | POA: Diagnosis not present

## 2024-08-22 DIAGNOSIS — M5136 Other intervertebral disc degeneration, lumbar region with discogenic back pain only: Secondary | ICD-10-CM | POA: Diagnosis not present

## 2024-08-22 DIAGNOSIS — M5459 Other low back pain: Secondary | ICD-10-CM | POA: Diagnosis not present

## 2024-09-09 ENCOUNTER — Other Ambulatory Visit: Payer: Self-pay | Admitting: Family Medicine

## 2024-09-10 DIAGNOSIS — M5459 Other low back pain: Secondary | ICD-10-CM | POA: Diagnosis not present

## 2024-09-17 DIAGNOSIS — M51361 Other intervertebral disc degeneration, lumbar region with lower extremity pain only: Secondary | ICD-10-CM | POA: Diagnosis not present

## 2024-09-17 DIAGNOSIS — M5416 Radiculopathy, lumbar region: Secondary | ICD-10-CM | POA: Diagnosis not present

## 2025-04-01 ENCOUNTER — Ambulatory Visit
